# Patient Record
Sex: Female | Born: 1960
Health system: Southern US, Community
[De-identification: ages and names within clinical notes are randomized; demographics above are authoritative.]

## PROBLEM LIST (undated history)

## (undated) DIAGNOSIS — M1990 Unspecified osteoarthritis, unspecified site: Secondary | ICD-10-CM

## (undated) DIAGNOSIS — N185 Chronic kidney disease, stage 5: Secondary | ICD-10-CM

## (undated) DIAGNOSIS — E119 Type 2 diabetes mellitus without complications: Secondary | ICD-10-CM

## (undated) DIAGNOSIS — E669 Obesity, unspecified: Secondary | ICD-10-CM

## (undated) DIAGNOSIS — Z8673 Personal history of transient ischemic attack (TIA), and cerebral infarction without residual deficits: Secondary | ICD-10-CM

## (undated) DIAGNOSIS — R569 Unspecified convulsions: Secondary | ICD-10-CM

## (undated) DIAGNOSIS — D649 Anemia, unspecified: Secondary | ICD-10-CM

## (undated) DIAGNOSIS — F29 Unspecified psychosis not due to a substance or known physiological condition: Secondary | ICD-10-CM

## (undated) DIAGNOSIS — I10 Essential (primary) hypertension: Secondary | ICD-10-CM

## (undated) HISTORY — DX: Unspecified osteoarthritis, unspecified site: M19.90

## (undated) HISTORY — DX: Obesity, unspecified: E66.9

## (undated) HISTORY — DX: Chronic kidney disease, stage 5: N18.5

## (undated) HISTORY — PX: ABDOMINAL HYSTERECTOMY: SHX81

## (undated) HISTORY — DX: Personal history of transient ischemic attack (TIA), and cerebral infarction without residual deficits: Z86.73

---

## 2016-10-18 DIAGNOSIS — N19 Unspecified kidney failure: Secondary | ICD-10-CM | POA: Diagnosis not present

## 2016-10-18 DIAGNOSIS — E119 Type 2 diabetes mellitus without complications: Secondary | ICD-10-CM | POA: Diagnosis not present

## 2016-10-18 DIAGNOSIS — J329 Chronic sinusitis, unspecified: Secondary | ICD-10-CM | POA: Diagnosis not present

## 2016-10-18 DIAGNOSIS — I10 Essential (primary) hypertension: Secondary | ICD-10-CM | POA: Diagnosis not present

## 2016-11-16 DIAGNOSIS — I639 Cerebral infarction, unspecified: Secondary | ICD-10-CM | POA: Diagnosis not present

## 2016-11-16 DIAGNOSIS — N19 Unspecified kidney failure: Secondary | ICD-10-CM | POA: Diagnosis not present

## 2016-11-16 DIAGNOSIS — E119 Type 2 diabetes mellitus without complications: Secondary | ICD-10-CM | POA: Diagnosis not present

## 2016-11-16 DIAGNOSIS — I10 Essential (primary) hypertension: Secondary | ICD-10-CM | POA: Diagnosis not present

## 2017-01-18 DIAGNOSIS — E119 Type 2 diabetes mellitus without complications: Secondary | ICD-10-CM | POA: Diagnosis not present

## 2017-01-18 DIAGNOSIS — I10 Essential (primary) hypertension: Secondary | ICD-10-CM | POA: Diagnosis not present

## 2017-03-13 DIAGNOSIS — E119 Type 2 diabetes mellitus without complications: Secondary | ICD-10-CM | POA: Diagnosis not present

## 2017-03-13 DIAGNOSIS — R112 Nausea with vomiting, unspecified: Secondary | ICD-10-CM | POA: Diagnosis not present

## 2017-03-13 DIAGNOSIS — I10 Essential (primary) hypertension: Secondary | ICD-10-CM | POA: Diagnosis not present

## 2017-03-13 DIAGNOSIS — Z794 Long term (current) use of insulin: Secondary | ICD-10-CM | POA: Diagnosis not present

## 2017-03-13 DIAGNOSIS — Z8673 Personal history of transient ischemic attack (TIA), and cerebral infarction without residual deficits: Secondary | ICD-10-CM | POA: Diagnosis not present

## 2017-03-13 DIAGNOSIS — Z7902 Long term (current) use of antithrombotics/antiplatelets: Secondary | ICD-10-CM | POA: Diagnosis not present

## 2017-03-13 DIAGNOSIS — R1011 Right upper quadrant pain: Secondary | ICD-10-CM | POA: Diagnosis not present

## 2017-03-13 DIAGNOSIS — I252 Old myocardial infarction: Secondary | ICD-10-CM | POA: Diagnosis not present

## 2017-03-13 DIAGNOSIS — Z87891 Personal history of nicotine dependence: Secondary | ICD-10-CM | POA: Diagnosis not present

## 2017-03-13 DIAGNOSIS — R1010 Upper abdominal pain, unspecified: Secondary | ICD-10-CM | POA: Diagnosis not present

## 2017-03-13 DIAGNOSIS — R197 Diarrhea, unspecified: Secondary | ICD-10-CM | POA: Diagnosis not present

## 2017-05-06 DIAGNOSIS — N8110 Cystocele, unspecified: Secondary | ICD-10-CM | POA: Diagnosis not present

## 2017-05-06 DIAGNOSIS — S90811A Abrasion, right foot, initial encounter: Secondary | ICD-10-CM | POA: Diagnosis not present

## 2017-05-06 DIAGNOSIS — Z87891 Personal history of nicotine dependence: Secondary | ICD-10-CM | POA: Diagnosis not present

## 2017-06-10 DIAGNOSIS — Z87891 Personal history of nicotine dependence: Secondary | ICD-10-CM | POA: Diagnosis not present

## 2017-06-10 DIAGNOSIS — R0602 Shortness of breath: Secondary | ICD-10-CM | POA: Diagnosis not present

## 2017-06-10 DIAGNOSIS — I252 Old myocardial infarction: Secondary | ICD-10-CM | POA: Diagnosis not present

## 2017-06-10 DIAGNOSIS — I10 Essential (primary) hypertension: Secondary | ICD-10-CM | POA: Diagnosis not present

## 2017-06-10 DIAGNOSIS — R0789 Other chest pain: Secondary | ICD-10-CM | POA: Diagnosis not present

## 2017-06-10 DIAGNOSIS — E119 Type 2 diabetes mellitus without complications: Secondary | ICD-10-CM | POA: Diagnosis not present

## 2017-06-10 DIAGNOSIS — E7800 Pure hypercholesterolemia, unspecified: Secondary | ICD-10-CM | POA: Diagnosis not present

## 2017-06-10 DIAGNOSIS — Z8673 Personal history of transient ischemic attack (TIA), and cerebral infarction without residual deficits: Secondary | ICD-10-CM | POA: Diagnosis not present

## 2017-06-10 DIAGNOSIS — K219 Gastro-esophageal reflux disease without esophagitis: Secondary | ICD-10-CM | POA: Diagnosis not present

## 2017-06-22 ENCOUNTER — Encounter (HOSPITAL_COMMUNITY): Payer: Self-pay

## 2017-06-22 ENCOUNTER — Observation Stay (HOSPITAL_COMMUNITY)
Admission: EM | Admit: 2017-06-22 | Discharge: 2017-06-23 | Disposition: A | Discharge status: Home / Self Care | Payer: Medicare Other | Attending: Family Medicine | Admitting: Family Medicine

## 2017-06-22 DIAGNOSIS — E1122 Type 2 diabetes mellitus with diabetic chronic kidney disease: Secondary | ICD-10-CM | POA: Insufficient documentation

## 2017-06-22 DIAGNOSIS — Z880 Allergy status to penicillin: Secondary | ICD-10-CM | POA: Diagnosis not present

## 2017-06-22 DIAGNOSIS — Z794 Long term (current) use of insulin: Secondary | ICD-10-CM | POA: Diagnosis not present

## 2017-06-22 DIAGNOSIS — N289 Disorder of kidney and ureter, unspecified: Secondary | ICD-10-CM

## 2017-06-22 DIAGNOSIS — Z79899 Other long term (current) drug therapy: Secondary | ICD-10-CM | POA: Insufficient documentation

## 2017-06-22 DIAGNOSIS — Z87891 Personal history of nicotine dependence: Secondary | ICD-10-CM | POA: Insufficient documentation

## 2017-06-22 DIAGNOSIS — B9689 Other specified bacterial agents as the cause of diseases classified elsewhere: Secondary | ICD-10-CM | POA: Insufficient documentation

## 2017-06-22 DIAGNOSIS — Z8489 Family history of other specified conditions: Secondary | ICD-10-CM | POA: Insufficient documentation

## 2017-06-22 DIAGNOSIS — I129 Hypertensive chronic kidney disease with stage 1 through stage 4 chronic kidney disease, or unspecified chronic kidney disease: Secondary | ICD-10-CM | POA: Diagnosis not present

## 2017-06-22 DIAGNOSIS — G40909 Epilepsy, unspecified, not intractable, without status epilepticus: Secondary | ICD-10-CM | POA: Insufficient documentation

## 2017-06-22 DIAGNOSIS — D649 Anemia, unspecified: Secondary | ICD-10-CM | POA: Diagnosis present

## 2017-06-22 DIAGNOSIS — E1165 Type 2 diabetes mellitus with hyperglycemia: Secondary | ICD-10-CM | POA: Diagnosis not present

## 2017-06-22 DIAGNOSIS — D631 Anemia in chronic kidney disease: Secondary | ICD-10-CM | POA: Insufficient documentation

## 2017-06-22 DIAGNOSIS — Z885 Allergy status to narcotic agent status: Secondary | ICD-10-CM | POA: Insufficient documentation

## 2017-06-22 DIAGNOSIS — N179 Acute kidney failure, unspecified: Secondary | ICD-10-CM | POA: Diagnosis not present

## 2017-06-22 DIAGNOSIS — N189 Chronic kidney disease, unspecified: Secondary | ICD-10-CM | POA: Diagnosis not present

## 2017-06-22 DIAGNOSIS — I10 Essential (primary) hypertension: Secondary | ICD-10-CM | POA: Diagnosis present

## 2017-06-22 DIAGNOSIS — Z881 Allergy status to other antibiotic agents status: Secondary | ICD-10-CM | POA: Diagnosis not present

## 2017-06-22 DIAGNOSIS — Z833 Family history of diabetes mellitus: Secondary | ICD-10-CM | POA: Diagnosis not present

## 2017-06-22 DIAGNOSIS — N760 Acute vaginitis: Secondary | ICD-10-CM | POA: Diagnosis not present

## 2017-06-22 HISTORY — DX: Unspecified convulsions: R56.9

## 2017-06-22 HISTORY — DX: Essential (primary) hypertension: I10

## 2017-06-22 LAB — CBC WITH DIFFERENTIAL/PLATELET
BASOS ABS: 0 10*3/uL (ref 0.0–0.1)
BASOS PCT: 0 %
Eosinophils Absolute: 0.2 10*3/uL (ref 0.0–0.7)
Eosinophils Relative: 4 %
HEMATOCRIT: 30.6 % — AB (ref 36.0–46.0)
Hemoglobin: 9.9 g/dL — ABNORMAL LOW (ref 12.0–15.0)
Lymphocytes Relative: 38 %
Lymphs Abs: 2.3 10*3/uL (ref 0.7–4.0)
MCH: 25.6 pg — ABNORMAL LOW (ref 26.0–34.0)
MCHC: 32.4 g/dL (ref 30.0–36.0)
MCV: 79.3 fL (ref 78.0–100.0)
MONO ABS: 0.5 10*3/uL (ref 0.1–1.0)
MONOS PCT: 8 %
NEUTROS ABS: 3.1 10*3/uL (ref 1.7–7.7)
Neutrophils Relative %: 50 %
Platelets: 293 10*3/uL (ref 150–400)
RBC: 3.86 MIL/uL — ABNORMAL LOW (ref 3.87–5.11)
RDW: 15.1 % (ref 11.5–15.5)
WBC: 6.1 10*3/uL (ref 4.0–10.5)

## 2017-06-22 LAB — I-STAT CHEM 8, ED
BUN: 30 mg/dL — ABNORMAL HIGH (ref 6–20)
CALCIUM ION: 1.16 mmol/L (ref 1.15–1.40)
CHLORIDE: 104 mmol/L (ref 101–111)
Creatinine, Ser: 2.8 mg/dL — ABNORMAL HIGH (ref 0.44–1.00)
GLUCOSE: 369 mg/dL — AB (ref 65–99)
HCT: 29 % — ABNORMAL LOW (ref 36.0–46.0)
Hemoglobin: 9.9 g/dL — ABNORMAL LOW (ref 12.0–15.0)
Potassium: 4.6 mmol/L (ref 3.5–5.1)
Sodium: 134 mmol/L — ABNORMAL LOW (ref 135–145)
TCO2: 22 mmol/L (ref 22–32)

## 2017-06-22 LAB — WET PREP, GENITAL
Sperm: NONE SEEN
TRICH WET PREP: NONE SEEN
Yeast Wet Prep HPF POC: NONE SEEN

## 2017-06-22 LAB — CBG MONITORING, ED: Glucose-Capillary: 393 mg/dL — ABNORMAL HIGH (ref 65–99)

## 2017-06-22 MED ORDER — BACITRACIN ZINC 500 UNIT/GM EX OINT
TOPICAL_OINTMENT | Freq: Two times a day (BID) | CUTANEOUS | Status: DC
  Administered 2017-06-22 – 2017-06-23 (×2): via TOPICAL
  Filled 2017-06-22: qty 0.9

## 2017-06-22 MED ORDER — SODIUM CHLORIDE 0.9 % IV BOLUS (SEPSIS)
1000.0000 mL | Freq: Once | INTRAVENOUS | Status: AC
  Administered 2017-06-22: 1000 mL via INTRAVENOUS
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
## 2017-06-22 NOTE — ED Provider Notes (Signed)
THIS IS A SHARED VISIT WITH DAVID SMITH, NP BP (!) 165/64 (BP Location: Left Arm)   Pulse 72   Temp 98.3 F (36.8 C) (Oral)   Resp 20   SpO2 100%    Amber Lara is a 56 y.o. female who request a female to do a pelvic exam because she feels like there is something in her vagina. She is sexually active with one partner but does not know if he has other partners.   Pelvic exam, external genitalia without lesions. Cystocele visualized. Cervix absent small amount of white d/c vaginal vault. Uterus absent. No tenderness on bimanual exam.  Swabs collected for cultures and wet prep.  Patient tolerated the exam without any difficulty.    Debroah Baller [place], [place] 06/22/17 [id number]
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
## 2017-06-22 NOTE — ED Notes (Signed)
Bed: WTR7 Expected date:  Expected time:  Means of arrival:  Comments: 

## 2017-06-23 ENCOUNTER — Encounter (HOSPITAL_COMMUNITY): Payer: Self-pay | Admitting: Internal Medicine

## 2017-06-23 DIAGNOSIS — N179 Acute kidney failure, unspecified: Secondary | ICD-10-CM

## 2017-06-23 DIAGNOSIS — N189 Chronic kidney disease, unspecified: Secondary | ICD-10-CM

## 2017-06-23 DIAGNOSIS — E1165 Type 2 diabetes mellitus with hyperglycemia: Secondary | ICD-10-CM

## 2017-06-23 DIAGNOSIS — I10 Essential (primary) hypertension: Secondary | ICD-10-CM | POA: Diagnosis present

## 2017-06-23 DIAGNOSIS — D649 Anemia, unspecified: Secondary | ICD-10-CM | POA: Diagnosis present

## 2017-06-23 DIAGNOSIS — D638 Anemia in other chronic diseases classified elsewhere: Secondary | ICD-10-CM

## 2017-06-23 LAB — BASIC METABOLIC PANEL
ANION GAP: 8 (ref 5–15)
BUN: 26 mg/dL — ABNORMAL HIGH (ref 6–20)
CO2: 21 mmol/L — AB (ref 22–32)
Calcium: 8.7 mg/dL — ABNORMAL LOW (ref 8.9–10.3)
Chloride: 107 mmol/L (ref 101–111)
Creatinine, Ser: 2.39 mg/dL — ABNORMAL HIGH (ref 0.44–1.00)
GFR calc Af Amer: 25 mL/min — ABNORMAL LOW (ref >60)
GFR calc non Af Amer: 22 mL/min — ABNORMAL LOW (ref >60)
GLUCOSE: 240 mg/dL — AB (ref 65–99)
POTASSIUM: 4.6 mmol/L (ref 3.5–5.1)
Sodium: 136 mmol/L (ref 135–145)

## 2017-06-23 LAB — GLUCOSE, CAPILLARY
GLUCOSE-CAPILLARY: 236 mg/dL — AB (ref 65–99)
Glucose-Capillary: 243 mg/dL — ABNORMAL HIGH (ref 65–99)
Glucose-Capillary: 251 mg/dL — ABNORMAL HIGH (ref 65–99)
Glucose-Capillary: 241 mg/dL — ABNORMAL HIGH (ref 65–99)

## 2017-06-23 LAB — CBC
HEMATOCRIT: 27.1 % — AB (ref 36.0–46.0)
HEMOGLOBIN: 8.9 g/dL — AB (ref 12.0–15.0)
MCH: 26.4 pg (ref 26.0–34.0)
MCHC: 32.8 g/dL (ref 30.0–36.0)
MCV: 80.4 fL (ref 78.0–100.0)
Platelets: 237 10*3/uL (ref 150–400)
RBC: 3.37 MIL/uL — ABNORMAL LOW (ref 3.87–5.11)
RDW: 15.4 % (ref 11.5–15.5)
WBC: 4.9 10*3/uL (ref 4.0–10.5)

## 2017-06-23 MED ORDER — ACETAMINOPHEN 325 MG PO TABS
650.0000 mg | ORAL_TABLET | Freq: Four times a day (QID) | ORAL | Status: DC | PRN
  Administered 2017-06-23: 650 mg via ORAL
  Filled 2017-06-23: qty 2

## 2017-06-23 MED ORDER — BENZTROPINE MESYLATE 1 MG PO TABS
2.0000 mg | ORAL_TABLET | Freq: Two times a day (BID) | ORAL | Status: DC
  Administered 2017-06-23 (×2): 2 mg via ORAL
  Filled 2017-06-23 (×3): qty 2

## 2017-06-23 MED ORDER — NOVOLOG FLEXPEN 100 UNIT/ML ~~LOC~~ SOPN: PEN_INJECTOR | SUBCUTANEOUS | 0 refills | Status: DC

## 2017-06-23 MED ORDER — CYCLOBENZAPRINE HCL 10 MG PO TABS
5.0000 mg | ORAL_TABLET | Freq: Three times a day (TID) | ORAL | Status: DC
  Administered 2017-06-23 (×2): 5 mg via ORAL
  Filled 2017-06-23 (×2): qty 1

## 2017-06-23 MED ORDER — FERROUS FUMARATE 324 (106 FE) MG PO TABS
1.0000 | ORAL_TABLET | Freq: Every day | ORAL | Status: DC
  Administered 2017-06-23: 106 mg via ORAL
  Filled 2017-06-23: qty 1

## 2017-06-23 MED ORDER — METRONIDAZOLE 500 MG PO TABS
500.0000 mg | ORAL_TABLET | Freq: Two times a day (BID) | ORAL | Status: DC
  Administered 2017-06-23: 500 mg via ORAL
  Filled 2017-06-23: qty 1

## 2017-06-23 MED ORDER — ONDANSETRON HCL 4 MG PO TABS: 4.0000 mg | ORAL_TABLET | Freq: Four times a day (QID) | ORAL | Status: DC | PRN

## 2017-06-23 MED ORDER — HEPARIN SODIUM (PORCINE) 5000 UNIT/ML IJ SOLN
5000.0000 [IU] | Freq: Three times a day (TID) | INTRAMUSCULAR | Status: DC
  Administered 2017-06-23 (×2): 5000 [IU] via SUBCUTANEOUS
  Filled 2017-06-23 (×2): qty 1

## 2017-06-23 MED ORDER — LANTUS SOLOSTAR 100 UNIT/ML ~~LOC~~ SOPN: 70.0000 [IU] | PEN_INJECTOR | Freq: Every evening | SUBCUTANEOUS | 0 refills | Status: DC

## 2017-06-23 MED ORDER — PANTOPRAZOLE SODIUM 40 MG PO TBEC
40.0000 mg | DELAYED_RELEASE_TABLET | Freq: Every day | ORAL | Status: DC
  Administered 2017-06-23: 40 mg via ORAL
  Filled 2017-06-23: qty 1

## 2017-06-23 MED ORDER — INSULIN GLARGINE 100 UNIT/ML ~~LOC~~ SOLN
70.0000 [IU] | Freq: Every evening | SUBCUTANEOUS | Status: DC
  Filled 2017-06-23: qty 0.7

## 2017-06-23 MED ORDER — SODIUM CHLORIDE 0.9 % IV SOLN
INTRAVENOUS | Status: AC
  Administered 2017-06-23: 04:00:00 via INTRAVENOUS

## 2017-06-23 MED ORDER — METRONIDAZOLE 500 MG PO TABS: 500.0000 mg | ORAL_TABLET | Freq: Two times a day (BID) | ORAL | 0 refills | Status: DC

## 2017-06-23 MED ORDER — ONDANSETRON HCL 4 MG/2ML IJ SOLN: 4.0000 mg | Freq: Four times a day (QID) | INTRAMUSCULAR | Status: DC | PRN

## 2017-06-23 MED ORDER — INSULIN ASPART 100 UNIT/ML ~~LOC~~ SOLN
0.0000 [IU] | Freq: Three times a day (TID) | SUBCUTANEOUS | Status: DC
  Administered 2017-06-23 (×2): 5 [IU] via SUBCUTANEOUS

## 2017-06-23 MED ORDER — ACETAMINOPHEN 650 MG RE SUPP: 650.0000 mg | Freq: Four times a day (QID) | RECTAL | Status: DC | PRN

## 2017-06-23 MED ORDER — LEVETIRACETAM 500 MG PO TABS
1000.0000 mg | ORAL_TABLET | Freq: Two times a day (BID) | ORAL | Status: DC
  Administered 2017-06-23: 1000 mg via ORAL
  Filled 2017-06-23: qty 2

## 2017-06-23 MED ORDER — DOCUSATE SODIUM 100 MG PO CAPS
200.0000 mg | ORAL_CAPSULE | Freq: Two times a day (BID) | ORAL | Status: DC
  Administered 2017-06-23: 200 mg via ORAL
  Filled 2017-06-23: qty 2

## 2017-06-23 MED ORDER — INSULIN GLARGINE 100 UNIT/ML ~~LOC~~ SOLN
20.0000 [IU] | Freq: Once | SUBCUTANEOUS | Status: AC
  Administered 2017-06-23: 20 [IU] via SUBCUTANEOUS
  Filled 2017-06-23: qty 0.2

## 2017-06-23 MED ORDER — FUROSEMIDE 20 MG PO TABS: 20.0000 mg | ORAL_TABLET | Freq: Two times a day (BID) | ORAL | 0 refills | Status: DC

## 2017-06-23 MED ORDER — SIMVASTATIN 10 MG PO TABS: 20.0000 mg | ORAL_TABLET | Freq: Every evening | ORAL | Status: DC
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
## 2017-06-23 NOTE — Care Management Note (Signed)
Case Management Note  Patient Details  Name: Amber Lara MRN: [id number] Date of Birth: 1961-08-14  Subjective/Objective:                  hyperglycemia  Action/Plan: Date:  June 23, 2017 Chart reviewed for concurrent status and case management needs.  Will continue to follow patient progress.  Discharge Planning: following for needs  Expected discharge date: [id number]  Velva Harman, BSN, [place], [address]   Expected Discharge Date:  06/25/17               Expected Discharge Plan:  Home/Self Care  In-House Referral:     Discharge planning Services  CM Consult  Post Acute Care Choice:    Choice offered to:     DME Arranged:    DME Agency:     HH Arranged:    HH Agency:     Status of Service:  In process, will continue to follow  If discussed at Long Length of Stay Meetings, dates discussed:    Additional Comments:  Leeroy Cha, RN 06/23/2017, 9:34 AM
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
## 2017-06-23 NOTE — Progress Notes (Signed)
Inpatient Diabetes Program Recommendations  AACE/ADA: New Consensus Statement on Inpatient Glycemic Control (2015)  Target Ranges:  Prepandial:   less than 140 mg/dL      Peak postprandial:   less than 180 mg/dL (1-2 hours)      Critically ill patients:  140 - 180 mg/dL   Lab Results  Component Value Date   GLUCAP 241 (H) 06/23/2017    Review of Glycemic Control  Diabetes history: DM2 Outpatient Diabetes medications: Lantus 70 units + Novolog 10-15 units tid meal coverage Current orders for Inpatient glycemic control: Lantus 70 units + Novolog Moderate correction tid  Inpatient Diabetes Program Recommendations:    Please consider: -Novolog 5 units meal coverage tid if eats 50% -A1c to determine prehospital glycemic control -Add Novolog 0-5 units hs  Will follow during hospitalization.  Thank you, Nani Gasser. Coy Vandoren, RN, MSN, CDE  Diabetes Coordinator Inpatient Glycemic Control Team Team Pager [phone] (8am-5pm) 06/23/2017 10:34 AM

## 2017-06-24 LAB — HIV ANTIBODY (ROUTINE TESTING W REFLEX): HIV Screen 4th Generation wRfx: NONREACTIVE

## 2017-06-26 LAB — GC/CHLAMYDIA PROBE AMP (~~LOC~~) NOT AT ARMC
Chlamydia: NEGATIVE
Neisseria Gonorrhea: NEGATIVE

## 2017-06-28 DIAGNOSIS — Z794 Long term (current) use of insulin: Principal | ICD-10-CM

## 2017-06-28 DIAGNOSIS — E0800 Diabetes mellitus due to underlying condition with hyperosmolarity without nonketotic hyperglycemic-hyperosmolar coma (NKHHC): Secondary | ICD-10-CM

## 2017-07-03 ENCOUNTER — Telehealth: Payer: Self-pay

## 2017-07-03 LAB — GLUCOSE, POCT (MANUAL RESULT ENTRY): POC Glucose: 282 mg/dl (ref 70–99)
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
## 2017-07-03 NOTE — Telephone Encounter (Signed)
TC to [facility] to arrange PCP appointment for client in order to establish care as she is a diabetic and has high blood pressure Appointment was set for 07-10-17 @  8 30 am . Will let client know next week of her appointment time to establish PCP

## 2017-07-04 DIAGNOSIS — Z794 Long term (current) use of insulin: Principal | ICD-10-CM

## 2017-07-04 DIAGNOSIS — E0800 Diabetes mellitus due to underlying condition with hyperosmolarity without nonketotic hyperglycemic-hyperosmolar coma (NKHHC): Secondary | ICD-10-CM

## 2017-07-06 LAB — GLUCOSE, POCT (MANUAL RESULT ENTRY): POC Glucose: 380 mg/dl (ref 70–99)

## 2017-07-06 NOTE — Congregational Nurse Program (Signed)
Congregational Nurse Program Note  Date of Encounter: 07/04/2017  Past Medical History: Past Medical History:  Diagnosis Date  . Diabetes mellitus without complication ([place])   . Hypertension   . Seizures ([place])     Encounter Details:     CNP Questionnaire - 07/06/17 1717      Patient Demographics   Is this a new or existing patient? Existing   Patient is considered a/an Not Applicable   Race African-American/Black     Patient Assistance   Location of Patient Assistance Not Applicable   Patient's financial/insurance status Medicare   Uninsured Patient ([place] Card/Care Connects) No   Patient referred to apply for the following financial assistance Not Applicable   Food insecurities addressed Not Applicable   Transportation assistance No   Assistance securing medications No   Educational health offerings Chronic disease;Diabetes;Hypertension;Navigating the healthcare system     Encounter Details   Primary purpose of visit Spiritual Care/Support Visit;Education/Health Concerns;Navigating the Healthcare System   Was an Emergency Department visit averted? Not Applicable   Does patient have a medical provider? No   Patient referred to Establish PCP   Was a mental health screening completed? (GAINS tool) No   Does patient have dental issues? No   Does patient have vision issues? No   Does your patient have an abnormal blood pressure today? Yes   Since previous encounter, have you referred patient for abnormal blood pressure that resulted in a new diagnosis or medication change? No   Does your patient have an abnormal blood glucose today? Yes   Since previous encounter, have you referred patient for abnormal blood glucose that resulted in a new diagnosis or medication change? No   Was there a life-saving intervention made? No     Nurse was able to secure PCP appointment for client on 07-10-17 @ 8:30 am given to client address and encouraged to keep in order to get blood work and  establish PCP. Client in agreement . Almost out of her medications will need MD to refill medications . States she is taking her medications . Blood  Sugar up somewhat states she drunk a sugary soda . Counseled briefly on nutrition. Will follow up next week after MD visit.

## 2017-07-10 ENCOUNTER — Emergency Department (HOSPITAL_COMMUNITY): Payer: Medicare Other

## 2017-07-10 ENCOUNTER — Encounter (HOSPITAL_COMMUNITY): Payer: Self-pay | Admitting: *Deleted

## 2017-07-10 ENCOUNTER — Emergency Department (HOSPITAL_COMMUNITY)
Admission: EM | Admit: 2017-07-10 | Discharge: 2017-07-11 | Disposition: A | Discharge status: Home / Self Care | Payer: Medicare Other | Attending: Emergency Medicine | Admitting: Emergency Medicine

## 2017-07-10 ENCOUNTER — Ambulatory Visit: Payer: Self-pay | Admitting: Family Medicine

## 2017-07-10 DIAGNOSIS — X58XXXA Exposure to other specified factors, initial encounter: Secondary | ICD-10-CM | POA: Insufficient documentation

## 2017-07-10 DIAGNOSIS — Z87891 Personal history of nicotine dependence: Secondary | ICD-10-CM | POA: Diagnosis not present

## 2017-07-10 DIAGNOSIS — Y929 Unspecified place or not applicable: Secondary | ICD-10-CM | POA: Diagnosis not present

## 2017-07-10 DIAGNOSIS — Y9389 Activity, other specified: Secondary | ICD-10-CM | POA: Diagnosis not present

## 2017-07-10 DIAGNOSIS — F0390 Unspecified dementia without behavioral disturbance: Secondary | ICD-10-CM | POA: Insufficient documentation

## 2017-07-10 DIAGNOSIS — Y999 Unspecified external cause status: Secondary | ICD-10-CM | POA: Insufficient documentation

## 2017-07-10 DIAGNOSIS — Z79899 Other long term (current) drug therapy: Secondary | ICD-10-CM | POA: Diagnosis not present

## 2017-07-10 DIAGNOSIS — W19XXXA Unspecified fall, initial encounter: Secondary | ICD-10-CM

## 2017-07-10 DIAGNOSIS — M542 Cervicalgia: Secondary | ICD-10-CM | POA: Insufficient documentation

## 2017-07-10 DIAGNOSIS — S098XXA Other specified injuries of head, initial encounter: Secondary | ICD-10-CM | POA: Diagnosis not present

## 2017-07-10 DIAGNOSIS — G4489 Other headache syndrome: Secondary | ICD-10-CM | POA: Diagnosis not present

## 2017-07-10 DIAGNOSIS — I10 Essential (primary) hypertension: Secondary | ICD-10-CM | POA: Insufficient documentation

## 2017-07-10 DIAGNOSIS — E1165 Type 2 diabetes mellitus with hyperglycemia: Secondary | ICD-10-CM | POA: Insufficient documentation

## 2017-07-10 DIAGNOSIS — S0083XA Contusion of other part of head, initial encounter: Secondary | ICD-10-CM | POA: Diagnosis not present

## 2017-07-10 NOTE — ED Notes (Addendum)
Pt called to finished triage, no answer.

## 2017-07-10 NOTE — ED Triage Notes (Signed)
Pt states she had a fall this morning and reports neck pain, sciatic nerve pain, and a headache. Pt reports falling off the bed and hit her head.  No obvious deformity noted on pt's head.  Pt reports nausea.  No LOC. Pt a/o x 4 and ambulatory.

## 2017-07-10 NOTE — ED Triage Notes (Signed)
Per EMS- patient c/o fall 3-4 hours ago. Patient c/o right leg pain, posterior neck pain, and states she hit her head. EMS reported no deformities.

## 2017-07-11 ENCOUNTER — Encounter (HOSPITAL_COMMUNITY): Payer: Self-pay | Admitting: Emergency Medicine

## 2017-07-11 MED ORDER — ACETAMINOPHEN 500 MG PO TABS
1000.0000 mg | ORAL_TABLET | Freq: Once | ORAL | Status: AC
  Administered 2017-07-11: 1000 mg via ORAL
  Filled 2017-07-11: qty 2

## 2017-07-11 MED ORDER — IBUPROFEN 800 MG PO TABS
800.0000 mg | ORAL_TABLET | Freq: Once | ORAL | Status: AC
  Administered 2017-07-11: 800 mg via ORAL
  Filled 2017-07-11: qty 1

## 2017-07-11 NOTE — Congregational Nurse Program (Signed)
Congregational Nurse Program Note  Date of Encounter: 07/11/2017  Past Medical History: Past Medical History:  Diagnosis Date  . Diabetes mellitus without complication ([place])   . Hypertension   . Seizures ([place])     Encounter Details:     CNP Questionnaire - 07/11/17 2238      Patient Demographics   Is this a new or existing patient? Existing   Patient is considered a/an Not Applicable   Race African-American/Black     Patient Assistance   Location of Patient Assistance Not Applicable   Patient's financial/insurance status Medicare   Uninsured Patient ([place] Card/Care Connects) No   Patient referred to apply for the following financial assistance Not Applicable   Food insecurities addressed Not Applicable   Transportation assistance No   Assistance securing medications No   Doctor, hospital the healthcare system;Medications;Diabetes;Chronic disease     Encounter Details   Primary purpose of visit Education/Health Concerns;Chronic Illness/Condition Visit;Navigating the Healthcare System;Spiritual Care/Support Visit   Was an Emergency Department visit averted? Not Applicable   Does patient have a medical provider? No   Patient referred to Establish PCP   Was a mental health screening completed? (GAINS tool) No   Does patient have dental issues? No   Does patient have vision issues? No   Does your patient have an abnormal blood pressure today? Yes   Since previous encounter, have you referred patient for abnormal blood pressure that resulted in a new diagnosis or medication change? No   Does your patient have an abnormal blood glucose today? Yes   Since previous encounter, have you referred patient for abnormal blood glucose that resulted in a new diagnosis or medication change? No   Was there a life-saving intervention made? No     Client in today but  Didn't keep her initial PCP appointment she states because she went to ED after falling out of bed  and hit her head. States they didn't give me any thing ?? States I am almost out of my insulin . Ask if she had refills  Of medication not  Sure ,nurse called  [facility] and there are no refills . Counseled that's why she needed to keep her appointment to establish care ,states she called and cancelled ,client will call tomorrow to see if she can reschedule and to emphasize she needs scripts to get her insulin. As a back up client was ask to call her old PCP in [facility]  To see if PCP would call in her a refill. Client to return to see her apartment down [place] but will not  Be returning there to live is moving on with her life and starting new. Very positive  Today . Ask if daughter and her were okay and talking now .  Client takes Lisinopril  20 mg and is taking it daily ,B/P up !    Follow  Tomorrow to see if  PCP appointment made and medication re-ordered!

## 2017-07-11 NOTE — ED Provider Notes (Signed)
[facility] DEPT Provider Note   CSN: [id number] Arrival date & time: 07/10/17  1112     History   Chief Complaint Chief Complaint  Patient presents with  . Fall  . Leg Pain  . Neck Pain  . Head Injury    HPI Amber Lara is a 56 y.o. female.  The history is provided by a relative and the EMS personnel. The history is limited by the condition of the patient.  Fall  This is a new problem. The current episode started 1 to 2 hours ago. The problem occurs constantly. The problem has not changed since onset.Pertinent negatives include no chest pain, no abdominal pain, no headaches and no shortness of breath. Nothing aggravates the symptoms. Nothing relieves the symptoms. She has tried nothing for the symptoms. The treatment provided no relief.    Past Medical History:  Diagnosis Date  . Diabetes mellitus without complication ([place])   . Hypertension   . Seizures [facility])     Patient Active Problem List   Diagnosis Date Noted  . Type 2 diabetes mellitus with hyperglycemia ([place]) 06/23/2017  . Normochromic normocytic anemia 06/23/2017  . Essential hypertension 06/23/2017  . Acute kidney injury superimposed on CKD ([place]) 06/23/2017    Past Surgical History:  Procedure Laterality Date  . ABDOMINAL HYSTERECTOMY      OB History    No data available       Home Medications    Prior to Admission medications   Medication Sig Start Date End Date Taking? Authorizing Provider  benztropine (COGENTIN) 2 MG tablet Take 2 mg by mouth 2 (two) times daily. 03/24/17   [provider]  cyclobenzaprine (FLEXERIL) 5 MG tablet Take 5 mg by mouth 3 (three) times daily. 06/08/17   [provider]  docusate sodium (COLACE) 100 MG capsule Take 200 mg by mouth 2 (two) times daily. 04/06/17   [provider]  Ferrous Fumarate (HEMOCYTE - 106 MG FE) 324 (106 Fe) MG TABS tablet Take 1 tablet by mouth daily. 06/08/17   [provider]  furosemide (LASIX) 20 MG  tablet Take 1 tablet (20 mg total) by mouth 2 (two) times daily. 06/23/17   Samuella Cota, MD  LANTUS SOLOSTAR 100 UNIT/ML Solostar Pen Inject 70 Units into the skin every evening. 06/23/17   Samuella Cota, MD  levETIRAcetam (KEPPRA) 1000 MG tablet Take 1,000 mg by mouth 2 (two) times daily. 05/09/17   [provider]  lisinopril (PRINIVIL,ZESTRIL) 30 MG tablet Take 30 mg by mouth daily. 06/08/17   [provider]  metroNIDAZOLE (FLAGYL) 500 MG tablet Take 1 tablet (500 mg total) by mouth every 12 (twelve) hours. 06/23/17   Samuella Cota, MD  NOVOLOG FLEXPEN 100 UNIT/ML FlexPen Per sliding scale given by your Ms. Belva Agee, your regular primary care provider. Breakfast, lunch, dinner. 06/23/17   Samuella Cota, MD  omeprazole (PRILOSEC) 20 MG capsule Take 20 mg by mouth daily. 06/08/17   [provider]  simvastatin (ZOCOR) 20 MG tablet Take 20 mg by mouth every evening. 06/08/17   [provider]    Family History Family History  Problem Relation Age of Onset  . Diabetes Mellitus II Mother   . Diabetes Mellitus II Sister   . Diabetes Mellitus II Brother     Social History Social History  Substance Use Topics  . Smoking status: Former Research scientist (life sciences)  . Smokeless tobacco: Never Used  . Alcohol use No     Allergies  Tetracyclines & related; Morphine and related; and Penicillins   Review of Systems Review of Systems  Unable to perform ROS: Dementia  Respiratory: Negative for shortness of breath.   Cardiovascular: Negative for chest pain.  Gastrointestinal: Negative for abdominal pain.  Genitourinary: Negative for pelvic pain.  Musculoskeletal: Positive for arthralgias.  Skin: Negative for pallor.  Neurological: Negative for dizziness, tremors, seizures, syncope, facial asymmetry, speech difficulty, weakness, light-headedness, numbness and headaches.  All other systems reviewed and are negative.    Physical Exam Updated Vital Signs BP  (!) 162/78 (BP Location: Right Arm)   Pulse 83   Temp (!) 97 F (36.1 C) (Oral)   Resp 18   Ht 5\' 3"  (1.6 m)   Wt 104.3 kg (230 lb)   SpO2 100%   BMI 40.74 kg/m   Physical Exam  Constitutional: She is oriented to person, place, and time. She appears well-developed and well-nourished. No distress.  HENT:  Head: Head is without raccoon's eyes and without Battle's sign.    Right Ear: No hemotympanum.  Left Ear: No hemotympanum.  Mouth/Throat: Oropharynx is clear and moist. No oropharyngeal exudate.  Eyes: Pupils are equal, round, and reactive to light. Conjunctivae are normal.  Neck: Normal range of motion. Neck supple.  Cardiovascular: Normal rate, regular rhythm, normal heart sounds and intact distal pulses.   Pulmonary/Chest: Effort normal and breath sounds normal. She has no wheezes. She has no rales.  Abdominal: Soft. Bowel sounds are normal. She exhibits no mass. There is no tenderness. There is no rebound and no guarding.  Musculoskeletal: Normal range of motion. She exhibits no tenderness or deformity.  Neurological: She is alert and oriented to person, place, and time. She displays normal reflexes.  Skin: Skin is warm and dry. Capillary refill takes less than 2 seconds.     ED Treatments / Results   Vitals:   07/10/17 1718 07/10/17 2351  BP: (!) 171/94 (!) 162/78  Pulse: 88 83  Resp: 16 18  Temp:  (!) 97 F (36.1 C)  SpO2: 100% 100%    Radiology Ct Head Wo Contrast  Result Date: 07/11/2017 CLINICAL DATA:  Patient fell out of bed this morning. Complains of neck pain and headache. No loss of consciousness. History of hypertension, diabetes, seizures. EXAM: CT HEAD WITHOUT CONTRAST CT CERVICAL SPINE WITHOUT CONTRAST TECHNIQUE: Multidetector CT imaging of the head and cervical spine was performed following the standard protocol without intravenous contrast. Multiplanar CT image reconstructions of the cervical spine were also generated. COMPARISON:  None. FINDINGS: CT  HEAD FINDINGS Brain: Mild cerebral atrophy. No ventricular dilatation. Focal low-attenuation areas demonstrated in the left anterior frontal lobe, the left temporal lobe, and the left posterior parietal and occipital lobe. These changes likely represent old or subacute infarcts. There is no mass effect or midline shift. No abnormal extra-axial fluid collections. Gray-white matter junctions are distinct. Basal cisterns are not effaced. No acute intracranial hemorrhage. Vascular: No hyperdense vessel or unexpected calcification. Skull: Calvarium appears intact. Sinuses/Orbits: Paranasal sinuses and mastoid air cells are not opacified. Other: Surgical clips versus calcifications in the soft tissues of the scalp over the bilateral temporal regions. CT CERVICAL SPINE FINDINGS Alignment: Straightening of usual cervical lordosis without anterior subluxation. This may be due to patient positioning but ligamentous injury or muscle spasm could also have this appearance and are not excluded. Normal alignment of the posterior facet joints. C1-2 articulation appears intact. Old appearing ununited ossicle adjacent to the right lateral mass of CT. Skull base  and vertebrae: No vertebral compression deformities. No focal bone lesion or bone destruction. Bone cortex appears intact. Soft tissues and spinal canal: No prevertebral soft tissue swelling. No paraspinal soft tissue infiltration. No soft tissue mass lesions identified. Disc levels: Mild degenerative endplate hypertrophic changes at C5-6 and C6-7 levels. Degenerative changes at C1 to. Upper chest: There is a 2.1 cm diameter right paratracheal lesion which is only seen on the inferior most slices of the study. This could represent the azygos vein but right peritracheal lymphadenopathy is not excluded. Consider follow-up with chest CT. Other: None. IMPRESSION: 1. No acute intracranial abnormalities. 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 Mild cerebral  atrophy. 2. Nonspecific straightening of usual cervical lordosis. No acute displaced fractures identified in the cervical spine. 3. Indeterminate right paratracheal lesion incompletely imaged on this study. This could represent the azygos vein or right peritracheal lymphadenopathy. Consider follow-up with chest CT. Electronically Signed   By: Lucienne Capers M.D.   On: 07/11/2017 00:33   Ct Cervical Spine Wo Contrast  Result Date: 07/11/2017 CLINICAL DATA:  Patient fell out of bed this morning. Complains of neck pain and headache. No loss of consciousness. History of hypertension, diabetes, seizures. EXAM: CT HEAD WITHOUT CONTRAST CT CERVICAL SPINE WITHOUT CONTRAST TECHNIQUE: Multidetector CT imaging of the head and cervical spine was performed following the standard protocol without intravenous contrast. Multiplanar CT image reconstructions of the cervical spine were also generated. COMPARISON:  None. FINDINGS: CT HEAD FINDINGS Brain: Mild cerebral atrophy. No ventricular dilatation. Focal low-attenuation areas demonstrated in the left anterior frontal lobe, the left temporal lobe, and the left posterior parietal and occipital lobe. These changes likely represent old or subacute infarcts. There is no mass effect or midline shift. No abnormal extra-axial fluid collections. Gray-white matter junctions are distinct. Basal cisterns are not effaced. No acute intracranial hemorrhage. Vascular: No hyperdense vessel or unexpected calcification. Skull: Calvarium appears intact. Sinuses/Orbits: Paranasal sinuses and mastoid air cells are not opacified. Other: Surgical clips versus calcifications in the soft tissues of the scalp over the bilateral temporal regions. CT CERVICAL SPINE FINDINGS Alignment: Straightening of usual cervical lordosis without anterior subluxation. This may be due to patient positioning but ligamentous injury or muscle spasm could also have this appearance and are not excluded. Normal alignment of  the posterior facet joints. C1-2 articulation appears intact. Old appearing ununited ossicle adjacent to the right lateral mass of CT. Skull base and vertebrae: No vertebral compression deformities. No focal bone lesion or bone destruction. Bone cortex appears intact. Soft tissues and spinal canal: No prevertebral soft tissue swelling. No paraspinal soft tissue infiltration. No soft tissue mass lesions identified. Disc levels: Mild degenerative endplate hypertrophic changes at C5-6 and C6-7 levels. Degenerative changes at C1 to. Upper chest: There is a 2.1 cm diameter right paratracheal lesion which is only seen on the inferior most slices of the study. This could represent the azygos vein but right peritracheal lymphadenopathy is not excluded. Consider follow-up with chest CT. Other: None. IMPRESSION: 1. No acute intracranial abnormalities. 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 Mild cerebral atrophy. 2. Nonspecific straightening of usual cervical lordosis. No acute displaced fractures identified in the cervical spine. 3. Indeterminate right paratracheal lesion incompletely imaged on this study. This could represent the azygos vein or right peritracheal lymphadenopathy. Consider follow-up with chest CT. Electronically Signed   By: Lucienne Capers M.D.   On: 07/11/2017 00:33   Dg Hip Unilat W Or Wo Pelvis 2-3 Views Right  Result Date: 07/11/2017 CLINICAL DATA:  Patient fell this morning landing on the right hip. Lateral hip pain with limited range of motion. EXAM: DG HIP (WITH OR WITHOUT PELVIS) 2-3V RIGHT COMPARISON:  None. FINDINGS: Visualization of the right hip is limited due to patient's body habitus. There is no evidence of hip fracture or dislocation. There is no evidence of arthropathy or other focal bone abnormality. IMPRESSION: Negative. Electronically Signed   By: Lucienne Capers M.D.   On: 07/11/2017 00:15    Procedures Procedures (including critical care  time)    Final Clinical Impressions(s) / ED Diagnoses    Follow up with your PMD.  Tell your doctor that radiology has recommended a dedicated neck CT to further differentiate the azygous vein from lymph gland seen on CT.  This was printed on your discharge papers for your to take to your doctor.    Return for facial wselling, shortness of breath, swelling or the lips or tongue, chest pain, dyspnea on exertion, new weakness or numbness changes in vision or speech,  Inability to tolerate liquids or food, changes in voice cough, altered mental status or any concerns. No signs of systemic illness or infection. The patient is nontoxic-appearing on exam and vital signs are within normal limits.    I have reviewed the triage vital signs and the nursing notes. Pertinent labs &imaging results that were available during my care of the patient were reviewed by me and considered in my medical decision making (see chart for details).  After history, exam, and medical workup I feel the patient has been appropriately medically screened and is safe for discharge home. Pertinent diagnoses were discussed with the patient. Patient was given return precautions.       Ronin Crager, MD 07/11/17 [phone]

## 2017-07-12 DIAGNOSIS — E0800 Diabetes mellitus due to underlying condition with hyperosmolarity without nonketotic hyperglycemic-hyperosmolar coma (NKHHC): Secondary | ICD-10-CM

## 2017-07-12 LAB — GLUCOSE, POCT (MANUAL RESULT ENTRY)

## 2017-07-12 NOTE — Congregational Nurse Program (Signed)
Congregational Nurse Program Note  Date of Encounter: 07/12/2017  Past Medical History: Past Medical History:  Diagnosis Date  . Diabetes mellitus without complication ([place])   . Hypertension   . Seizures ([place])     Encounter Details:     CNP Questionnaire - 07/12/17 1842      Patient Demographics   Is this a new or existing patient? Existing   Patient is considered a/an Not Applicable   Race African-American/Black     Patient Assistance   Location of Patient Assistance Not Applicable   Patient's financial/insurance status Medicare   Uninsured Patient ([place] [employer]) No   Patient referred to apply for the following financial assistance Not Applicable   Food insecurities addressed Not Applicable   Transportation assistance No   Assistance securing medications No   Educational health offerings Navigating the healthcare system;Diabetes;Chronic disease;Medications;Hypertension     Encounter Details   Primary purpose of visit Education/Health Concerns;Spiritual Care/Support Visit;Chronic Illness/Condition Visit   Was an Emergency Department visit averted? Not Applicable   Does patient have a medical provider? No   Patient referred to Establish PCP   Was a mental health screening completed? (GAINS tool) No   Does patient have vision issues? No   Does your patient have an abnormal blood pressure today? Yes   Since previous encounter, have you referred patient for abnormal blood pressure that resulted in a new diagnosis or medication change? No   Does your patient have an abnormal blood glucose today? Yes   Since previous encounter, have you referred patient for abnormal blood glucose that resulted in a new diagnosis or medication change? No   Was there a life-saving intervention made? No     Client in for follow up visit . Client called and got rescheduled to establish a PCP here since she will be living here Called and got an appointment  for tomorrow at  8 am [facility] . Client needs to keep appointment in order to get medications filled as she is almost out of her insulin and will need medications. no refills  ordered and we have called her pharmacy. Blood pressure and blood sugar higher today ,gave client her recent blood pressure readings to share with PCP ,may need to look at her medication for his .Marland Kitchen Needs A1-C also Counseled client on her readings .   will follow up next week after PCP appointment

## 2017-07-13 ENCOUNTER — Encounter (HOSPITAL_COMMUNITY): Payer: Self-pay | Admitting: Emergency Medicine

## 2017-07-13 DIAGNOSIS — Z79899 Other long term (current) drug therapy: Secondary | ICD-10-CM | POA: Insufficient documentation

## 2017-07-13 DIAGNOSIS — G8929 Other chronic pain: Secondary | ICD-10-CM | POA: Diagnosis not present

## 2017-07-13 DIAGNOSIS — Z885 Allergy status to narcotic agent status: Secondary | ICD-10-CM | POA: Insufficient documentation

## 2017-07-13 DIAGNOSIS — F29 Unspecified psychosis not due to a substance or known physiological condition: Secondary | ICD-10-CM | POA: Insufficient documentation

## 2017-07-13 DIAGNOSIS — Z833 Family history of diabetes mellitus: Secondary | ICD-10-CM | POA: Insufficient documentation

## 2017-07-13 DIAGNOSIS — E1122 Type 2 diabetes mellitus with diabetic chronic kidney disease: Secondary | ICD-10-CM | POA: Diagnosis not present

## 2017-07-13 DIAGNOSIS — E1165 Type 2 diabetes mellitus with hyperglycemia: Secondary | ICD-10-CM | POA: Diagnosis not present

## 2017-07-13 DIAGNOSIS — R600 Localized edema: Secondary | ICD-10-CM | POA: Diagnosis present

## 2017-07-13 DIAGNOSIS — E875 Hyperkalemia: Secondary | ICD-10-CM | POA: Diagnosis not present

## 2017-07-13 DIAGNOSIS — G40909 Epilepsy, unspecified, not intractable, without status epilepticus: Secondary | ICD-10-CM | POA: Insufficient documentation

## 2017-07-13 DIAGNOSIS — Z87892 Personal history of anaphylaxis: Secondary | ICD-10-CM | POA: Diagnosis not present

## 2017-07-13 DIAGNOSIS — E162 Hypoglycemia, unspecified: Secondary | ICD-10-CM | POA: Diagnosis not present

## 2017-07-13 DIAGNOSIS — D631 Anemia in chronic kidney disease: Secondary | ICD-10-CM | POA: Diagnosis not present

## 2017-07-13 DIAGNOSIS — M4050 Lordosis, unspecified, site unspecified: Secondary | ICD-10-CM | POA: Insufficient documentation

## 2017-07-13 DIAGNOSIS — Z87891 Personal history of nicotine dependence: Secondary | ICD-10-CM | POA: Insufficient documentation

## 2017-07-13 DIAGNOSIS — Z8249 Family history of ischemic heart disease and other diseases of the circulatory system: Secondary | ICD-10-CM | POA: Insufficient documentation

## 2017-07-13 DIAGNOSIS — R0789 Other chest pain: Principal | ICD-10-CM | POA: Insufficient documentation

## 2017-07-13 DIAGNOSIS — Z90710 Acquired absence of both cervix and uterus: Secondary | ICD-10-CM | POA: Insufficient documentation

## 2017-07-13 DIAGNOSIS — Z880 Allergy status to penicillin: Secondary | ICD-10-CM | POA: Diagnosis not present

## 2017-07-13 DIAGNOSIS — Z881 Allergy status to other antibiotic agents status: Secondary | ICD-10-CM | POA: Insufficient documentation

## 2017-07-13 DIAGNOSIS — E1121 Type 2 diabetes mellitus with diabetic nephropathy: Secondary | ICD-10-CM | POA: Insufficient documentation

## 2017-07-13 DIAGNOSIS — G319 Degenerative disease of nervous system, unspecified: Secondary | ICD-10-CM | POA: Insufficient documentation

## 2017-07-13 DIAGNOSIS — N184 Chronic kidney disease, stage 4 (severe): Secondary | ICD-10-CM | POA: Insufficient documentation

## 2017-07-13 DIAGNOSIS — I129 Hypertensive chronic kidney disease with stage 1 through stage 4 chronic kidney disease, or unspecified chronic kidney disease: Secondary | ICD-10-CM | POA: Insufficient documentation

## 2017-07-13 DIAGNOSIS — Z794 Long term (current) use of insulin: Secondary | ICD-10-CM | POA: Insufficient documentation

## 2017-07-13 DIAGNOSIS — I639 Cerebral infarction, unspecified: Secondary | ICD-10-CM | POA: Diagnosis not present

## 2017-07-13 DIAGNOSIS — Z590 Homelessness: Secondary | ICD-10-CM | POA: Diagnosis not present

## 2017-07-13 DIAGNOSIS — M5432 Sciatica, left side: Secondary | ICD-10-CM | POA: Diagnosis not present

## 2017-07-13 DIAGNOSIS — N179 Acute kidney failure, unspecified: Secondary | ICD-10-CM | POA: Insufficient documentation

## 2017-07-13 DIAGNOSIS — E1065 Type 1 diabetes mellitus with hyperglycemia: Secondary | ICD-10-CM | POA: Diagnosis not present

## 2017-07-13 LAB — CBC
HEMATOCRIT: 29.3 % — AB (ref 36.0–46.0)
HEMOGLOBIN: 9.5 g/dL — AB (ref 12.0–15.0)
MCH: 26.1 pg (ref 26.0–34.0)
MCHC: 32.4 g/dL (ref 30.0–36.0)
MCV: 80.5 fL (ref 78.0–100.0)
Platelets: 278 10*3/uL (ref 150–400)
RBC: 3.64 MIL/uL — AB (ref 3.87–5.11)
RDW: 15.1 % (ref 11.5–15.5)
WBC: 6.6 10*3/uL (ref 4.0–10.5)

## 2017-07-13 LAB — BASIC METABOLIC PANEL
ANION GAP: 10 (ref 5–15)
BUN: 33 mg/dL — ABNORMAL HIGH (ref 6–20)
CALCIUM: 9.2 mg/dL (ref 8.9–10.3)
CO2: 21 mmol/L — ABNORMAL LOW (ref 22–32)
Chloride: 102 mmol/L (ref 101–111)
Creatinine, Ser: 3.17 mg/dL — ABNORMAL HIGH (ref 0.44–1.00)
GFR, EST AFRICAN AMERICAN: 18 mL/min — AB (ref >60)
GFR, EST NON AFRICAN AMERICAN: 15 mL/min — AB (ref >60)
GLUCOSE: 396 mg/dL — AB (ref 65–99)
POTASSIUM: 5.2 mmol/L — AB (ref 3.5–5.1)
Sodium: 133 mmol/L — ABNORMAL LOW (ref 135–145)

## 2017-07-13 NOTE — ED Notes (Signed)
Pt is lying on her lt side in the lobby. Per NF Mortimer Fries pt is complaining of chest pain. Pt is ask to sit up to move to wheel chair so this tech and get a EKG on pt. Pt refuses to move or get into a wheel chair. NF Mortimer Fries ask pt to move to a wheel chair so a EKG can be gotten. Pt refuses and RN explains to pt she needs get in a wheel chair if she has a bed. Pt still states she can't and a bed would have to be brought out to the lobby when she does get a bed.

## 2017-07-13 NOTE — ED Triage Notes (Signed)
Per [facility]: Pt called out for bilateral ankle pain and swelling. Edema noted in both ankles, non-pitting. Pt c/o chronic R leg pain (hx sciatica). EMS states patient was standing outside of [employer] for 2 hours because they wouldn't let her in since she had too many clothes with her. Pt states she is stressed out and when she's stressed, her sugar runs high. EMS: CBG 423, 168/74, P 92 regular strong bilaterally, R 18. Patient ambulatory to truck without difficulties. Pt states she hasn't taken any of her medications today because they wouldn't let her in, "so she missed her insulin, seizure medication, lasix, etc."

## 2017-07-14 ENCOUNTER — Emergency Department (HOSPITAL_COMMUNITY): Payer: Medicare Other

## 2017-07-14 ENCOUNTER — Observation Stay (HOSPITAL_COMMUNITY)
Admission: EM | Admit: 2017-07-14 | Discharge: 2017-07-15 | Disposition: A | Discharge status: Home / Self Care | Payer: Medicare Other | Attending: Internal Medicine | Admitting: Internal Medicine

## 2017-07-14 ENCOUNTER — Observation Stay (HOSPITAL_COMMUNITY): Payer: Medicare Other

## 2017-07-14 ENCOUNTER — Encounter (HOSPITAL_COMMUNITY): Payer: Self-pay | Admitting: Internal Medicine

## 2017-07-14 DIAGNOSIS — E875 Hyperkalemia: Secondary | ICD-10-CM

## 2017-07-14 DIAGNOSIS — Z794 Long term (current) use of insulin: Secondary | ICD-10-CM

## 2017-07-14 DIAGNOSIS — N189 Chronic kidney disease, unspecified: Secondary | ICD-10-CM

## 2017-07-14 DIAGNOSIS — E1165 Type 2 diabetes mellitus with hyperglycemia: Secondary | ICD-10-CM

## 2017-07-14 DIAGNOSIS — N179 Acute kidney failure, unspecified: Secondary | ICD-10-CM

## 2017-07-14 DIAGNOSIS — R600 Localized edema: Secondary | ICD-10-CM

## 2017-07-14 HISTORY — DX: Type 2 diabetes mellitus without complications: E11.9

## 2017-07-14 HISTORY — DX: Anemia, unspecified: D64.9

## 2017-07-14 HISTORY — DX: Unspecified psychosis not due to a substance or known physiological condition: F29

## 2017-07-14 LAB — URINALYSIS, ROUTINE W REFLEX MICROSCOPIC
BILIRUBIN URINE: NEGATIVE
Hgb urine dipstick: NEGATIVE
Ketones, ur: NEGATIVE mg/dL
Leukocytes, UA: NEGATIVE
NITRITE: NEGATIVE
PH: 6 (ref 5.0–8.0)
Protein, ur: >=300 mg/dL — AB
SPECIFIC GRAVITY, URINE: 1.011 (ref 1.005–1.030)

## 2017-07-14 LAB — CREATININE, SERUM
Creatinine, Ser: 2.81 mg/dL — ABNORMAL HIGH (ref 0.44–1.00)
GFR, EST AFRICAN AMERICAN: 21 mL/min — AB (ref >60)
GFR, EST NON AFRICAN AMERICAN: 18 mL/min — AB (ref >60)

## 2017-07-14 LAB — RAPID URINE DRUG SCREEN, HOSP PERFORMED
AMPHETAMINES: NOT DETECTED
BENZODIAZEPINES: NOT DETECTED
Barbiturates: NOT DETECTED
Cocaine: NOT DETECTED
OPIATES: NOT DETECTED
TETRAHYDROCANNABINOL: NOT DETECTED

## 2017-07-14 LAB — CBG MONITORING, ED
Glucose-Capillary: 288 mg/dL — ABNORMAL HIGH (ref 65–99)
Glucose-Capillary: 308 mg/dL — ABNORMAL HIGH (ref 65–99)

## 2017-07-14 LAB — I-STAT TROPONIN, ED: Troponin i, poc: 0 ng/mL (ref 0.00–0.08)

## 2017-07-14 LAB — HEMOGLOBIN A1C
Hgb A1c MFr Bld: 10.2 % — ABNORMAL HIGH (ref 4.8–5.6)
Mean Plasma Glucose: 246.04 mg/dL

## 2017-07-14 LAB — TROPONIN I
Troponin I: <0.03 ng/mL (ref <0.03)
Troponin I: <0.03 ng/mL (ref <0.03)

## 2017-07-14 LAB — GLUCOSE, CAPILLARY
GLUCOSE-CAPILLARY: 158 mg/dL — AB (ref 65–99)
Glucose-Capillary: 85 mg/dL (ref 65–99)

## 2017-07-14 LAB — SODIUM, URINE, RANDOM: Sodium, Ur: 64 mmol/L

## 2017-07-14 LAB — VITAMIN B12: Vitamin B-12: 713 pg/mL (ref 180–914)

## 2017-07-14 LAB — FERRITIN: FERRITIN: 47 ng/mL (ref 11–307)

## 2017-07-14 LAB — BRAIN NATRIURETIC PEPTIDE: B NATRIURETIC PEPTIDE 5: 5.9 pg/mL (ref 0.0–100.0)

## 2017-07-14 LAB — IRON AND TIBC
Iron: 35 ug/dL (ref 28–170)
SATURATION RATIOS: 14 % (ref 10.4–31.8)
TIBC: 258 ug/dL (ref 250–450)
UIBC: 223 ug/dL

## 2017-07-14 LAB — TSH: TSH: 2.701 u[IU]/mL (ref 0.350–4.500)

## 2017-07-14 MED ORDER — SODIUM CHLORIDE 0.9 % IV SOLN
INTRAVENOUS | Status: DC
  Administered 2017-07-14: 07:00:00 via INTRAVENOUS

## 2017-07-14 MED ORDER — FERROUS FUMARATE 324 (106 FE) MG PO TABS
1.0000 | ORAL_TABLET | Freq: Every day | ORAL | Status: DC
  Administered 2017-07-15: 106 mg via ORAL
  Filled 2017-07-14 (×2): qty 1

## 2017-07-14 MED ORDER — ACETAMINOPHEN 650 MG RE SUPP: 650.0000 mg | Freq: Four times a day (QID) | RECTAL | Status: DC | PRN

## 2017-07-14 MED ORDER — PNEUMOCOCCAL VAC POLYVALENT 25 MCG/0.5ML IJ INJ
0.5000 mL | INJECTION | INTRAMUSCULAR | Status: DC
  Filled 2017-07-14: qty 0.5

## 2017-07-14 MED ORDER — HYDRALAZINE HCL 25 MG PO TABS
25.0000 mg | ORAL_TABLET | Freq: Three times a day (TID) | ORAL | Status: DC
  Administered 2017-07-14 – 2017-07-15 (×4): 25 mg via ORAL
  Filled 2017-07-14 (×4): qty 1

## 2017-07-14 MED ORDER — SODIUM CHLORIDE 0.9 % IV SOLN
INTRAVENOUS | Status: AC
  Administered 2017-07-14 – 2017-07-15 (×2): via INTRAVENOUS

## 2017-07-14 MED ORDER — ACETAMINOPHEN 325 MG PO TABS
650.0000 mg | ORAL_TABLET | Freq: Four times a day (QID) | ORAL | Status: DC | PRN
  Administered 2017-07-15: 650 mg via ORAL
  Filled 2017-07-14: qty 2

## 2017-07-14 MED ORDER — LEVETIRACETAM 500 MG PO TABS
1000.0000 mg | ORAL_TABLET | Freq: Two times a day (BID) | ORAL | Status: DC
  Administered 2017-07-14 – 2017-07-15 (×3): 1000 mg via ORAL
  Filled 2017-07-14 (×3): qty 2

## 2017-07-14 MED ORDER — INSULIN ASPART 100 UNIT/ML ~~LOC~~ SOLN
0.0000 [IU] | Freq: Three times a day (TID) | SUBCUTANEOUS | Status: DC
  Administered 2017-07-14: 15 [IU] via SUBCUTANEOUS
  Administered 2017-07-15: 5 [IU] via SUBCUTANEOUS
  Administered 2017-07-15: 8 [IU] via SUBCUTANEOUS
  Filled 2017-07-14: qty 1

## 2017-07-14 MED ORDER — HEPARIN SODIUM (PORCINE) 5000 UNIT/ML IJ SOLN
5000.0000 [IU] | Freq: Three times a day (TID) | INTRAMUSCULAR | Status: DC
  Administered 2017-07-14 – 2017-07-15 (×3): 5000 [IU] via SUBCUTANEOUS
  Filled 2017-07-14 (×2): qty 1

## 2017-07-14 MED ORDER — BENZTROPINE MESYLATE 2 MG PO TABS
2.0000 mg | ORAL_TABLET | Freq: Two times a day (BID) | ORAL | Status: DC
  Administered 2017-07-14 – 2017-07-15 (×3): 2 mg via ORAL
  Filled 2017-07-14 (×2): qty 1
  Filled 2017-07-14: qty 2
  Filled 2017-07-14: qty 1

## 2017-07-14 MED ORDER — INSULIN ASPART 100 UNIT/ML ~~LOC~~ SOLN: 0.0000 [IU] | Freq: Every day | SUBCUTANEOUS | Status: DC

## 2017-07-14 MED ORDER — INSULIN GLARGINE 100 UNIT/ML ~~LOC~~ SOLN
60.0000 [IU] | Freq: Every day | SUBCUTANEOUS | Status: DC
  Administered 2017-07-14 – 2017-07-15 (×2): 60 [IU] via SUBCUTANEOUS
  Filled 2017-07-14 (×2): qty 0.6

## 2017-07-14 NOTE — ED Notes (Signed)
ED Provider at bedside. 

## 2017-07-14 NOTE — ED Notes (Signed)
Lunch tray ordered with fruit vegetables and baked potato

## 2017-07-14 NOTE — H&P (Addendum)
TRH H&P   Patient Demographics:    Amber Lara, is a 56 y.o. female  MRN: [id number]   DOB - 1961/04/05  Admit Date - 07/14/2017  Outpatient Primary MD for the patient is Amber Alanis, NP  Referring MD/NP/PA: Veryl Speak  Outpatient Specialists:   Patient coming from:  [facility]  Chief Complaint  Patient presents with  . Leg Swelling      HPI:    Amber Lara  is a 56 y.o. female, w Dm2, CKD stage 4, Anemia, apparently presents with c/o hyperglycemia and presented to ED for evaluation. Also noted some lower ext edema. As well as chest pain "tightness" starting around 9pm.  No radiation.  Slight cough, yellow and green sputum. Slight dyspnea.  Denies fever, chills, palp, n/v, diarrhea, brbpr. +anxiety. Slight discomfort at this point in time.   In ED, BS 396,  CXR negative. Bun/creatinine 33/3.17, K=5.2, Na=133.  hgb 9.5,  Pt will be admitted for w/up of ARF on CRF and chest pain.       Review of systems:    In addition to the HPI above,  No Fever-chills, No Headache, No changes with Vision or hearing, No problems swallowing food or Liquids,  No Abdominal pain, No Nausea or Vommitting, Bowel movements are regular, No Blood in stool or Urine, No dysuria, No new skin rashes or bruises, No new joints pains-aches,  No new weakness, tingling, numbness in any extremity, No recent weight gain or loss, No polyuria, polydypsia or polyphagia, No significant Mental Stressors.  A full 10 point Review of Systems was done, except as stated above, all other Review of Systems were negative.   With Past History of the following :    Past Medical History:  Diagnosis Date  . Diabetes mellitus without complication ([place])   . Hypertension   . Seizures ([place])       Past Surgical History:  Procedure Laterality Date  . ABDOMINAL HYSTERECTOMY        Social  History:     Social History  Substance Use Topics  . Smoking status: Former Research scientist (life sciences)  . Smokeless tobacco: Never Used  . Alcohol use No     Lives - at [facility] - walks by self   Family History :     Family History  Problem Relation Age of Onset  . Diabetes Mellitus II Mother   . Diabetes Mellitus II Sister   . Diabetes Mellitus II Brother       Home Medications:   Prior to Admission medications   Medication Sig Start Date End Date Taking? Authorizing Provider  benztropine (COGENTIN) 2 MG tablet Take 2 mg by mouth 2 (two) times daily. 03/24/17  Yes [provider]  Ferrous Fumarate (HEMOCYTE - 106 MG FE) 324 (106 Fe) MG TABS tablet Take 1 tablet by mouth daily. 06/08/17  Yes  [provider]  furosemide (LASIX) 20 MG tablet Take 1 tablet (20 mg total) by mouth 2 (two) times daily. 06/23/17  Yes Samuella Cota, MD  LANTUS SOLOSTAR 100 UNIT/ML Solostar Pen Inject 70 Units into the skin every evening. 06/23/17  Yes Samuella Cota, MD  levETIRAcetam (KEPPRA) 1000 MG tablet Take 1,000 mg by mouth 2 (two) times daily. 05/09/17  Yes [provider]  lisinopril (PRINIVIL,ZESTRIL) 30 MG tablet Take 30 mg by mouth daily. 06/08/17  Yes [provider]  NOVOLOG FLEXPEN 100 UNIT/ML FlexPen Per sliding scale given by your Ms. Belva Agee, your regular primary care provider. Breakfast, lunch, dinner. Patient taking differently: Inject 1-15 Units into the skin 3 (three) times daily with meals. Per sliding scale 06/23/17  Yes Samuella Cota, MD  metroNIDAZOLE (FLAGYL) 500 MG tablet Take 1 tablet (500 mg total) by mouth every 12 (twelve) hours. Patient not taking: Reported on 07/14/2017 06/23/17   Samuella Cota, MD     Allergies:     Allergies  Allergen Reactions  . Tetracyclines & Related Anaphylaxis and Hives  . Morphine And Related Hives  . Penicillins     Has patient had a PCN reaction causing immediate rash,  facial/tongue/throat swelling, SOB or lightheadedness with hypotension: yes Has patient had a PCN reaction causing severe rash involving mucus membranes or skin n24} If all of the above answers are "NO", then may proceed with Cephalosporin use.ecrosis: no Has patient had a PCN reaction that required hospitalization: no Has patient had a PCN reaction occurring within the last 10 years:no     Physical Exam:   Vitals  Blood pressure (!) 159/76, pulse 69, temperature 97.9 F (36.6 C), temperature source Oral, resp. rate 14, SpO2 99 %.   1. General  lying in bed in NAD,    2. Normal affect and insight, Not Suicidal or Homicidal, Awake Alert, Oriented X 3.  3. No F.N deficits, ALL C.Nerves Intact, Strength 5/5 all 4 extremities, Sensation intact all 4 extremities, Plantars down going.  4. Ears and Eyes appear Normal, Conjunctivae clear, PERRLA. Moist Oral Mucosa.  5. Supple Neck, No JVD, No cervical lymphadenopathy appriciated, No Carotid Bruits.  6. Symmetrical Chest wall movement, Good air movement bilaterally, CTAB.  7. RRR, No Gallops, Rubs or Murmurs, No Parasternal Heave.  8. Positive Bowel Sounds, Abdomen Soft, No tenderness, No organomegaly appriciated,No rebound -guarding or rigidity.  9.  No Cyanosis, Normal Skin Turgor, No Skin Rash or Bruise.  10. Good muscle tone,  joints appear normal , no effusions, Normal ROM.  11. No Palpable Lymph Nodes in Neck or Axillae     Data Review:    CBC  Recent Labs Lab 07/13/17 2153  WBC 6.6  HGB 9.5*  HCT 29.3*  PLT 278  MCV 80.5  MCH 26.1  MCHC 32.4  RDW 15.1   ------------------------------------------------------------------------------------------------------------------  Chemistries   Recent Labs Lab 07/13/17 2153  NA 133*  K 5.2*  CL 102  CO2 21*  GLUCOSE 396*  BUN 33*  CREATININE 3.17*  CALCIUM 9.2    ------------------------------------------------------------------------------------------------------------------ estimated creatinine clearance is 23.2 mL/min (A) (by C-G formula based on SCr of 3.17 mg/dL (H)). ------------------------------------------------------------------------------------------------------------------ No results for input(s): TSH, T4TOTAL, T3FREE, THYROIDAB in the last 72 hours.  Invalid input(s): FREET3  Coagulation profile No results for input(s): INR, PROTIME in the last 168 hours. ------------------------------------------------------------------------------------------------------------------- No results for input(s): DDIMER in the last 72 hours. -------------------------------------------------------------------------------------------------------------------  Cardiac Enzymes No results for input(s): CKMB,  TROPONINI, MYOGLOBIN in the last 168 hours.  Invalid input(s): CK ------------------------------------------------------------------------------------------------------------------    Component Value Date/Time   BNP 5.9 07/14/2017 0312     ---------------------------------------------------------------------------------------------------------------  Urinalysis No results found for: COLORURINE, APPEARANCEUR, LABSPEC, [place], GLUCOSEU, HGBUR, BILIRUBINUR, KETONESUR, PROTEINUR, UROBILINOGEN, NITRITE, LEUKOCYTESUR  ----------------------------------------------------------------------------------------------------------------   Imaging Results:    Dg Chest 2 View  Result Date: 07/14/2017 CLINICAL DATA:  Bilateral lower extremity swelling and high blood sugar today. History of hypertension and diabetes. Nonsmoker. EXAM: CHEST  2 VIEW COMPARISON:  None. FINDINGS: The heart size and mediastinal contours are within normal limits. Both lungs are clear. The visualized skeletal structures are unremarkable. IMPRESSION: No active cardiopulmonary disease.  Electronically Signed   By: Lucienne Capers M.D.   On: 07/14/2017 03:35    Assessment & Plan:    Principal Problem:   Chest pain Active Problems:   Type 2 diabetes mellitus with hyperglycemia (HCC)   Acute kidney injury superimposed on CKD (HCC)   Hyperkalemia     Cp      Tele Trop I q6h x3 12 lead ekg Check cardiac echo Nuclear stress test Cardiology consulted by email  ARF on CRF (baseline creatinine 2.4) Check urine sodium, urine creatinine, urine eosinophils Check renal ultrasound Check spep STOP Lisinopril Hold Lasix this am.   Hyperkalemia STOP Lisinopril Check cmp in am   Anemia Check cbc , ferritin, iron, tibc, folate, b12, tsh, spep, upep  Dm2, u ncontrolled with diabetic nephropathy HOLD Lantus fsbs q4h, ISS  Hypertension Hydralazine 25mg  po tid STOP Lisinopril due to renal insufficiency  DVT Prophylaxis Heparin -  SCDs   AM Labs Ordered, also please review Full Orders  Family Communication: Admission, patients condition and plan of care including tests being ordered have been discussed with the patient who indicate understanding and agree with the plan and Code Status.  Code Status FULL Code  Likely DC to  home  Condition GUARDED    Consults called: none  Admission status: observation   Time spent in minutes : 45   Jani Gravel M.D on 07/14/2017 at 5:22 AM  Between 7am to 7pm - Pager - [phone] . After 7pm go to [email or web address] - password [facility]  [facility] - Office  [phone]

## 2017-07-14 NOTE — ED Provider Notes (Signed)
[facility] DEPT Provider Note   CSN: [id number] Arrival date & time: 07/13/17  2127     History   Chief Complaint Chief Complaint  Patient presents with  . Leg Swelling    HPI Amber Lara is a 56 y.o. female.  Patient is a 56 year old female with past medical history of chronic renal insufficiency, hypertension, diabetes. She presents today for evaluation of leg swelling. This is been worsening over the past several weeks, however became much worse today after being on her feet for prolonged period of time. She reports feeling short of breath. She denies any fevers or chills. She does report experiencing some chest discomfort while in the waiting room.   The history is provided by the patient.    Past Medical History:  Diagnosis Date  . Diabetes mellitus without complication ([place])   . Hypertension   . Seizures [facility])     Patient Active Problem List   Diagnosis Date Noted  . Type 2 diabetes mellitus with hyperglycemia ([place]) 06/23/2017  . Normochromic normocytic anemia 06/23/2017  . Essential hypertension 06/23/2017  . Acute kidney injury superimposed on CKD ([place]) 06/23/2017    Past Surgical History:  Procedure Laterality Date  . ABDOMINAL HYSTERECTOMY      OB History    No data available       Home Medications    Prior to Admission medications   Medication Sig Start Date End Date Taking? Authorizing Provider  benztropine (COGENTIN) 2 MG tablet Take 2 mg by mouth 2 (two) times daily. 03/24/17  Yes [provider]  Ferrous Fumarate (HEMOCYTE - 106 MG FE) 324 (106 Fe) MG TABS tablet Take 1 tablet by mouth daily. 06/08/17  Yes [provider]  furosemide (LASIX) 20 MG tablet Take 1 tablet (20 mg total) by mouth 2 (two) times daily. 06/23/17  Yes Samuella Cota, MD  LANTUS SOLOSTAR 100 UNIT/ML Solostar Pen Inject 70 Units into the skin every evening. 06/23/17  Yes Samuella Cota, MD  levETIRAcetam (KEPPRA) 1000 MG tablet Take 1,000 mg  by mouth 2 (two) times daily. 05/09/17  Yes [provider]  lisinopril (PRINIVIL,ZESTRIL) 30 MG tablet Take 30 mg by mouth daily. 06/08/17  Yes [provider]  NOVOLOG FLEXPEN 100 UNIT/ML FlexPen Per sliding scale given by your Ms. Belva Agee, your regular primary care provider. Breakfast, lunch, dinner. Patient taking differently: Inject 1-15 Units into the skin 3 (three) times daily with meals. Per sliding scale 06/23/17  Yes Samuella Cota, MD  metroNIDAZOLE (FLAGYL) 500 MG tablet Take 1 tablet (500 mg total) by mouth every 12 (twelve) hours. Patient not taking: Reported on 07/14/2017 06/23/17   Samuella Cota, MD    Family History Family History  Problem Relation Age of Onset  . Diabetes Mellitus II Mother   . Diabetes Mellitus II Sister   . Diabetes Mellitus II Brother     Social History Social History  Substance Use Topics  . Smoking status: Former Research scientist (life sciences)  . Smokeless tobacco: Never Used  . Alcohol use No     Allergies   Tetracyclines & related; Morphine and related; and Penicillins   Review of Systems Review of Systems  All other systems reviewed and are negative.    Physical Exam Updated Vital Signs BP (!) 153/68 (BP Location: Left Arm)   Pulse 80   Temp 97.9 F (36.6 C) (Oral)   Resp 18   SpO2 97%   Physical Exam  Constitutional: She is oriented to person,  place, and time. She appears well-developed and well-nourished. No distress.  HENT:  Head: Normocephalic and atraumatic.  Neck: Normal range of motion. Neck supple.  Cardiovascular: Normal rate and regular rhythm.  Exam reveals no gallop and no friction rub.   No murmur heard. Pulmonary/Chest: Effort normal and breath sounds normal. No respiratory distress. She has no wheezes. She has no rales.  Abdominal: Soft. Bowel sounds are normal. She exhibits no distension. There is no tenderness.  Musculoskeletal: Normal range of motion. She exhibits edema.  There is 1-2+ pitting edema of  both lower extremities.  Neurological: She is alert and oriented to person, place, and time.  Skin: Skin is warm and dry. She is not diaphoretic.  Nursing note and vitals reviewed.    ED Treatments / Results  Labs (all labs ordered are listed, but only abnormal results are displayed) Labs Reviewed  BASIC METABOLIC PANEL - Abnormal; Notable for the following:       Result Value   Sodium 133 (*)    Potassium 5.2 (*)    CO2 21 (*)    Glucose, Bld 396 (*)    BUN 33 (*)    Creatinine, Ser 3.17 (*)    GFR calc non Af Amer 15 (*)    GFR calc Af Amer 18 (*)    All other components within normal limits  CBC - Abnormal; Notable for the following:    RBC 3.64 (*)    Hemoglobin 9.5 (*)    HCT 29.3 (*)    All other components within normal limits  URINALYSIS, ROUTINE W REFLEX MICROSCOPIC  BRAIN NATRIURETIC PEPTIDE  CBG MONITORING, ED  I-STAT TROPONIN, ED    EKG  EKG Interpretation None       Radiology No results found.  Procedures Procedures (including critical care time)  Medications Ordered in ED Medications - No data to display   Initial Impression / Assessment and Plan / ED Course  I have reviewed the triage vital signs and the nursing notes.  Pertinent labs & imaging results that were available during my care of the patient were reviewed by me and considered in my medical decision making (see chart for details).  Patient with leg edema and worsening renal function. She also has a mild hyperkalemia and uncontrolled diabetes. I discussed these findings with Dr. Maudie Mercury from the hospitalist service who will admit for close observation of her renal function, tighter control of her diabetes, and possible diuresis.  Final Clinical Impressions(s) / ED Diagnoses   Final diagnoses:  None    New Prescriptions New Prescriptions   No medications on file     Veryl Speak, MD 07/14/17 [phone]

## 2017-07-14 NOTE — ED Notes (Signed)
Pt given drink and graham crackers

## 2017-07-14 NOTE — ED Notes (Signed)
Admitting at bedside 
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
## 2017-07-14 NOTE — ED Notes (Signed)
Attempted to provide [place] sand for pt. Pt refusing wanting something from cafeteria. RN notified.Marland Kitchen
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
## 2017-07-14 NOTE — ED Notes (Signed)
cbg was [id number]

## 2017-07-15 ENCOUNTER — Observation Stay (HOSPITAL_BASED_OUTPATIENT_CLINIC_OR_DEPARTMENT_OTHER): Payer: Medicare Other

## 2017-07-15 DIAGNOSIS — R011 Cardiac murmur, unspecified: Secondary | ICD-10-CM

## 2017-07-15 DIAGNOSIS — R079 Chest pain, unspecified: Secondary | ICD-10-CM

## 2017-07-15 DIAGNOSIS — I340 Nonrheumatic mitral (valve) insufficiency: Secondary | ICD-10-CM

## 2017-07-15 LAB — GLUCOSE, CAPILLARY
GLUCOSE-CAPILLARY: 227 mg/dL — AB (ref 65–99)
GLUCOSE-CAPILLARY: 283 mg/dL — AB (ref 65–99)
GLUCOSE-CAPILLARY: 171 mg/dL — AB (ref 65–99)

## 2017-07-15 LAB — COMPREHENSIVE METABOLIC PANEL
ALK PHOS: 79 U/L (ref 38–126)
ALT: 15 U/L (ref 14–54)
ANION GAP: 8 (ref 5–15)
AST: 18 U/L (ref 15–41)
Albumin: 2.5 g/dL — ABNORMAL LOW (ref 3.5–5.0)
BILIRUBIN TOTAL: 0.6 mg/dL (ref 0.3–1.2)
BUN: 28 mg/dL — ABNORMAL HIGH (ref 6–20)
CHLORIDE: 105 mmol/L (ref 101–111)
CO2: 21 mmol/L — AB (ref 22–32)
Calcium: 8.5 mg/dL — ABNORMAL LOW (ref 8.9–10.3)
Creatinine, Ser: 2.37 mg/dL — ABNORMAL HIGH (ref 0.44–1.00)
GFR calc non Af Amer: 22 mL/min — ABNORMAL LOW (ref >60)
GFR, EST AFRICAN AMERICAN: 25 mL/min — AB (ref >60)
Glucose, Bld: 222 mg/dL — ABNORMAL HIGH (ref 65–99)
POTASSIUM: 4.2 mmol/L (ref 3.5–5.1)
Sodium: 134 mmol/L — ABNORMAL LOW (ref 135–145)
Total Protein: 6 g/dL — ABNORMAL LOW (ref 6.5–8.1)

## 2017-07-15 LAB — CBC
HEMATOCRIT: 28.8 % — AB (ref 36.0–46.0)
HEMOGLOBIN: 9.1 g/dL — AB (ref 12.0–15.0)
MCH: 25.4 pg — AB (ref 26.0–34.0)
MCHC: 31.6 g/dL (ref 30.0–36.0)
MCV: 80.4 fL (ref 78.0–100.0)
Platelets: 235 10*3/uL (ref 150–400)
RBC: 3.58 MIL/uL — ABNORMAL LOW (ref 3.87–5.11)
RDW: 14.6 % (ref 11.5–15.5)
WBC: 6.5 10*3/uL (ref 4.0–10.5)

## 2017-07-15 LAB — ECHOCARDIOGRAM COMPLETE
Height: 63 in
Weight: 3744 oz

## 2017-07-15 LAB — CALCIUM / CREATININE RATIO, URINE
CALCIUM UR: 0.9 mg/dL
CALCIUM/CREAT. RATIO: 14 mg/g{creat} (ref 0–260)
CREATININE, UR: 65.8 mg/dL

## 2017-07-15 MED ORDER — HYDRALAZINE HCL 25 MG PO TABS: 25.0000 mg | ORAL_TABLET | Freq: Three times a day (TID) | ORAL | 0 refills | Status: DC

## 2017-07-15 MED ORDER — ACETAMINOPHEN 325 MG PO TABS: 650.0000 mg | ORAL_TABLET | Freq: Four times a day (QID) | ORAL | Status: AC | PRN

## 2017-07-15 MED ORDER — LANTUS SOLOSTAR 100 UNIT/ML ~~LOC~~ SOPN: 70.0000 [IU] | PEN_INJECTOR | Freq: Every day | SUBCUTANEOUS | Status: DC

## 2017-07-15 MED ORDER — DICLOFENAC SODIUM 1 % TD GEL: 2.0000 g | Freq: Four times a day (QID) | TRANSDERMAL | 0 refills | Status: DC

## 2017-07-15 MED ORDER — DICLOFENAC SODIUM 1 % TD GEL
2.0000 g | Freq: Four times a day (QID) | TRANSDERMAL | Status: DC
  Administered 2017-07-15: 2 g via TOPICAL
  Filled 2017-07-15: qty 100

## 2017-07-15 MED ORDER — FUROSEMIDE 20 MG PO TABS: 20.0000 mg | ORAL_TABLET | Freq: Every day | ORAL | 0 refills | Status: DC
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
## 2017-07-15 NOTE — Progress Notes (Signed)
Progress Note  Patient Name: Amber Lara Date of Encounter: 07/15/2017  Primary Cardiologist:   New Dr. Debara Lara  Subjective   Mild chest pain unchanged from previous.  No acute complaints such as SOB  Inpatient Medications    Scheduled Meds: . benztropine  2 mg Oral BID  . Ferrous Fumarate  1 tablet Oral Daily  . heparin  5,000 Units Subcutaneous Q8H  . hydrALAZINE  25 mg Oral Q8H  . insulin aspart  0-15 Units Subcutaneous TID WC  . insulin aspart  0-5 Units Subcutaneous QHS  . insulin glargine  60 Units Subcutaneous Daily  . levETIRAcetam  1,000 mg Oral BID  . pneumococcal 23 valent vaccine  0.5 mL Intramuscular Tomorrow-1000   Continuous Infusions:  PRN Meds: acetaminophen **OR** acetaminophen   Vital Signs    Vitals:   07/14/17 1700 07/14/17 2304 07/15/17 0541 07/15/17 0900  BP:  (!) 151/62 (!) 152/76 134/68  Pulse:  78 73 72  Resp:  18 18 18   Temp:  98.5 F (36.9 C) 98.1 F (36.7 C) 97.8 F (36.6 C)  TempSrc:  Oral Oral Oral  SpO2:  100% 100% 100%  Weight: 220 lb (99.8 kg) 234 lb (106.1 kg)    Height: 5\' 3"  (1.6 m)       Intake/Output Summary (Last 24 hours) at 07/15/17 1040 Last data filed at 07/15/17 0601  Gross per 24 hour  Intake              840 ml  Output                0 ml  Net              840 ml   Filed Weights   07/14/17 1700 07/14/17 2304  Weight: 220 lb (99.8 kg) 234 lb (106.1 kg)    Telemetry    NSR - Personally Reviewed  ECG    NA - Personally Reviewed  Physical Exam   GEN: No acute distress.   Neck: No  JVD Cardiac: RRR, no murmurs, rubs, or gallops.  Respiratory: Clear  to auscultation bilaterally. GI: Soft, nontender, non-distended  MS: No  edema; No deformity. Neuro:  Nonfocal  Psych: Normal affect   Labs    Chemistry Recent Labs Lab 07/13/17 2153 07/14/17 0941 07/15/17 0245  NA 133*  --  134*  K 5.2*  --  4.2  CL 102  --  105  CO2 21*  --  21*  GLUCOSE 396*  --  222*  BUN 33*  --  28*  CREATININE  3.17* 2.81* 2.37*  CALCIUM 9.2  --  8.5*  PROT  --   --  6.0*  ALBUMIN  --   --  2.5*  AST  --   --  18  ALT  --   --  15  ALKPHOS  --   --  79  BILITOT  --   --  0.6  GFRNONAA 15* 18* 22*  GFRAA 18* 21* 25*  ANIONGAP 10  --  8     Hematology Recent Labs Lab 07/13/17 2153 07/15/17 0245  WBC 6.6 6.5  RBC 3.64* 3.58*  HGB 9.5* 9.1*  HCT 29.3* 28.8*  MCV 80.5 80.4  MCH 26.1 25.4*  MCHC 32.4 31.6  RDW 15.1 14.6  PLT 278 235    Cardiac Enzymes Recent Labs Lab 07/14/17 0941 07/14/17 1649 07/14/17 2035  TROPONINI <0.03 <0.03 <0.03    Recent Labs Lab 07/14/17 0316  TROPIPOC 0.00     BNP Recent Labs Lab 07/14/17 0312  BNP 5.9     DDimer No results for input(s): DDIMER in the last 168 hours.   Radiology    Dg Chest 2 View  Result Date: 07/14/2017 CLINICAL DATA:  Bilateral lower extremity swelling and high blood sugar today. History of hypertension and diabetes. Nonsmoker. EXAM: CHEST  2 VIEW COMPARISON:  None. FINDINGS: The heart size and mediastinal contours are within normal limits. Both lungs are clear. The visualized skeletal structures are unremarkable. IMPRESSION: No active cardiopulmonary disease. Electronically Signed   By: Lucienne Capers M.D.   On: 07/14/2017 03:35   US Renal  Result Date: 07/14/2017 CLINICAL DATA:  56 year old female acute renal failure. EXAM: RENAL / URINARY TRACT ULTRASOUND COMPLETE COMPARISON:  None. FINDINGS: Right Kidney: Length: 11.9 cm. Echogenicity within normal limits. No mass or hydronephrosis visualized. Left Kidney: Length: 12.1 cm. Echogenicity within normal limits. No mass or hydronephrosis visualized. Bladder: Appears normal for degree of bladder distention. IMPRESSION: Normal ultrasound appearance of both kidneys and the urinary bladder. Electronically Signed   By: Genevie Ann M.D.   On: 07/14/2017 09:16    Cardiac Studies   ECHO:  Pending  Patient Profile     56 y.o. female with a hx of insulin-dependent diabetes,  CKD stage IV, presumed anemia of chronic disease, psychosis, seizures, heart murmur, prior positive methamphetamines on UDS who is being seen for the evaluation of chest pain at the request of Dr. Maudie Mercury.  She also has a murmur.    Assessment & Plan    CHEST PAIN:    Enzymes negative.  Not thought to be angina.  No objective evidence of ischemia.  No further work up.   MURMUR:  Echo still final pending.   I reviewed the echo being done at the bedside and there is no MVP or regurgitation.  We will comment after the final result but I do not suspect that she will need any further cardiac work up.    Signed, Minus Breeding, MD  07/15/2017, 10:40 AM

## 2017-07-15 NOTE — Discharge Instructions (Signed)

## 2017-07-15 NOTE — Progress Notes (Signed)
  Echocardiogram 2D Echocardiogram has been performed.  Gabbrielle Mcnicholas T Mieka Leaton 07/15/2017, 11:14 AM

## 2017-07-17 LAB — FOLATE RBC
FOLATE, HEMOLYSATE: 445.9 ng/mL
Folate, RBC: 1522 ng/mL (ref >498)
Hematocrit: 29.3 % — ABNORMAL LOW (ref 34.0–46.6)

## 2017-07-19 ENCOUNTER — Other Ambulatory Visit: Payer: Self-pay | Admitting: Pharmacist

## 2017-07-19 ENCOUNTER — Encounter: Payer: Self-pay | Admitting: Family Medicine

## 2017-07-19 ENCOUNTER — Ambulatory Visit (INDEPENDENT_AMBULATORY_CARE_PROVIDER_SITE_OTHER): Payer: Self-pay | Admitting: Family Medicine

## 2017-07-19 VITALS — BP 140/76 | HR 82 | Temp 97.7°F | Resp 14 | Ht 63.0 in

## 2017-07-19 DIAGNOSIS — Z1321 Encounter for screening for nutritional disorder: Secondary | ICD-10-CM | POA: Diagnosis not present

## 2017-07-19 DIAGNOSIS — Z1329 Encounter for screening for other suspected endocrine disorder: Secondary | ICD-10-CM | POA: Diagnosis not present

## 2017-07-19 DIAGNOSIS — I10 Essential (primary) hypertension: Secondary | ICD-10-CM

## 2017-07-19 DIAGNOSIS — E118 Type 2 diabetes mellitus with unspecified complications: Secondary | ICD-10-CM | POA: Diagnosis not present

## 2017-07-19 LAB — POCT URINALYSIS DIP (DEVICE)
BILIRUBIN URINE: NEGATIVE
Glucose, UA: 250 mg/dL — AB
KETONES UR: NEGATIVE mg/dL
Leukocytes, UA: NEGATIVE
Nitrite: NEGATIVE
PH: 6 (ref 5.0–8.0)
SPECIFIC GRAVITY, URINE: 1.015 (ref 1.005–1.030)
Urobilinogen, UA: 0.2 mg/dL (ref 0.0–1.0)

## 2017-07-19 LAB — GLUCOSE, CAPILLARY
Glucose-Capillary: 297 mg/dL — ABNORMAL HIGH (ref 65–99)
Glucose-Capillary: 254 mg/dL — ABNORMAL HIGH (ref 65–99)

## 2017-07-19 MED ORDER — HYDRALAZINE HCL 25 MG PO TABS: 25.0000 mg | ORAL_TABLET | Freq: Three times a day (TID) | ORAL | 2 refills | Status: DC

## 2017-07-19 MED ORDER — NOVOLOG FLEXPEN 100 UNIT/ML ~~LOC~~ SOPN: PEN_INJECTOR | SUBCUTANEOUS | 5 refills | Status: DC

## 2017-07-19 MED ORDER — "INSULIN SYRINGE-NEEDLE U-100 31G X 5/16"" 0.3 ML MISC": 5 refills | Status: DC

## 2017-07-19 MED ORDER — LEVETIRACETAM 1000 MG PO TABS: 1000.0000 mg | ORAL_TABLET | Freq: Two times a day (BID) | ORAL | 1 refills | Status: DC

## 2017-07-19 MED ORDER — INSULIN PEN NEEDLE 30G X 8 MM MISC: 1 refills | Status: DC

## 2017-07-19 MED ORDER — LANTUS SOLOSTAR 100 UNIT/ML ~~LOC~~ SOPN: 70.0000 [IU] | PEN_INJECTOR | Freq: Every day | SUBCUTANEOUS | Status: DC
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
## 2017-07-19 NOTE — Patient Instructions (Signed)
Local mental health resources: [facility] [place], [place]. [facility], [place], [place]   Diabetes Mellitus and Food It is important for you to manage your blood sugar (glucose) level. Your blood glucose level can be greatly affected by what you eat. Eating healthier foods in the appropriate amounts throughout the day at about the same time each day will help you control your blood glucose level. It can also help slow or prevent worsening of your diabetes mellitus. Healthy eating may even help you improve the level of your blood pressure and reach or maintain a healthy weight. General recommendations for healthful eating and cooking habits include:  Eating meals and snacks regularly. Avoid going long periods of time without eating to lose weight.  Eating a diet that consists mainly of plant-based foods, such as fruits, vegetables, nuts, legumes, and whole grains.  Using low-heat cooking methods, such as baking, instead of high-heat cooking methods, such as deep frying.  Work with your dietitian to make sure you understand how to use the Nutrition Facts information on food labels. How can food affect me? Carbohydrates Carbohydrates affect your blood glucose level more than any other type of food. Your dietitian will help you determine how many carbohydrates to eat at each meal and teach you how to count carbohydrates. Counting carbohydrates is important to keep your blood glucose at a healthy level, especially if you are using insulin or taking certain medicines for diabetes mellitus. Alcohol Alcohol can cause sudden decreases in blood glucose (hypoglycemia), especially if you use insulin or take certain medicines for diabetes mellitus. Hypoglycemia can be a life-threatening condition. Symptoms of hypoglycemia (sleepiness, dizziness, and disorientation) are similar to symptoms of having too much alcohol. If your  health care provider has given you approval to drink alcohol, do so in moderation and use the following guidelines:  Women should not have more than one drink per day, and men should not have more than two drinks per day. One drink is equal to: ? 12 oz of beer. ? 5 oz of wine. ? 1 oz of hard liquor.  Do not drink on an empty stomach.  Keep yourself hydrated. Have water, diet soda, or unsweetened iced tea.  Regular soda, juice, and other mixers might contain a lot of carbohydrates and should be counted.  What foods are not recommended? As you make food choices, it is important to remember that all foods are not the same. Some foods have fewer nutrients per serving than other foods, even though they might have the same number of calories or carbohydrates. It is difficult to get your body what it needs when you eat foods with fewer nutrients. Examples of foods that you should avoid that are high in calories and carbohydrates but low in nutrients include:  Trans fats (most processed foods list trans fats on the Nutrition Facts label).  Regular soda.  Juice.  Candy.  Sweets, such as cake, pie, doughnuts, and cookies.  Fried foods.  What foods can I eat? Eat nutrient-rich foods, which will nourish your body and keep you healthy. The food you should eat also will depend on several factors, including:  The calories you need.  The medicines you take.  Your weight.  Your blood glucose level.  Your blood pressure level.  Your cholesterol level.  You should eat a variety of foods, including:  Protein. ? Lean cuts of meat. ? Proteins low in saturated fats, such as fish, egg  whites, and beans. Avoid processed meats.  Fruits and vegetables. ? Fruits and vegetables that may help control blood glucose levels, such as apples, mangoes, and yams.  Dairy products. ? Choose fat-free or low-fat dairy products, such as milk, yogurt, and cheese.  Grains, bread, pasta, and  rice. ? Choose whole grain products, such as multigrain bread, whole oats, and brown rice. These foods may help control blood pressure.  Fats. ? Foods containing healthful fats, such as nuts, avocado, olive oil, canola oil, and fish.  Does everyone with diabetes mellitus have the same meal plan? Because every person with diabetes mellitus is different, there is not one meal plan that works for everyone. It is very important that you meet with a dietitian who will help you create a meal plan that is just right for you. This information is not intended to replace advice given to you by your health care provider. Make sure you discuss any questions you have with your health care provider. Document Released: 06/23/2005 Document Revised: 03/03/2016 Document Reviewed: 08/23/2013 Elsevier Interactive Patient Education  2017 Elsevier Inc.  Hypertension Hypertension is another name for high blood pressure. High blood pressure forces your heart to work harder to pump blood. This can cause problems over time. There are two numbers in a blood pressure reading. There is a top number (systolic) over a bottom number (diastolic). It is best to have a blood pressure below 120/80. Healthy choices can help lower your blood pressure. You may need medicine to help lower your blood pressure if:  Your blood pressure cannot be lowered with healthy choices.  Your blood pressure is higher than 130/80.  Follow these instructions at home: Eating and drinking  If directed, follow the DASH eating plan. This diet includes: ? Filling half of your plate at each meal with fruits and vegetables. ? Filling one quarter of your plate at each meal with whole grains. Whole grains include whole wheat pasta, brown rice, and whole grain bread. ? Eating or drinking low-fat dairy products, such as skim milk or low-fat yogurt. ? Filling one quarter of your plate at each meal with low-fat (lean) proteins. Low-fat proteins include  fish, skinless chicken, eggs, beans, and tofu. ? Avoiding fatty meat, cured and processed meat, or chicken with skin. ? Avoiding premade or processed food.  Eat less than 1,500 mg of salt (sodium) a day.  Limit alcohol use to no more than 1 drink a day for nonpregnant women and 2 drinks a day for men. One drink equals 12 oz of beer, 5 oz of wine, or 1 oz of hard liquor. Lifestyle  Work with your doctor to stay at a healthy weight or to lose weight. Ask your doctor what the best weight is for you.  Get at least 30 minutes of exercise that causes your heart to beat faster (aerobic exercise) most days of the week. This may include walking, swimming, or biking.  Get at least 30 minutes of exercise that strengthens your muscles (resistance exercise) at least 3 days a week. This may include lifting weights or pilates.  Do not use any products that contain nicotine or tobacco. This includes cigarettes and e-cigarettes. If you need help quitting, ask your doctor.  Check your blood pressure at home as told by your doctor.  Keep all follow-up visits as told by your doctor. This is important. Medicines  Take over-the-counter and prescription medicines only as told by your doctor. Follow directions carefully.  Do not skip doses of  blood pressure medicine. The medicine does not work as well if you skip doses. Skipping doses also puts you at risk for problems.  Ask your doctor about side effects or reactions to medicines that you should watch for. Contact a doctor if:  You think you are having a reaction to the medicine you are taking.  You have headaches that keep coming back (recurring).  You feel dizzy.  You have swelling in your ankles.  You have trouble with your vision. Get help right away if:  You get a very bad headache.  You start to feel confused.  You feel weak or numb.  You feel faint.  You get very bad pain in your: ? Chest. ? Belly (abdomen).  You throw up (vomit)  more than once.  You have trouble breathing. Summary  Hypertension is another name for high blood pressure.  Making healthy choices can help lower blood pressure. If your blood pressure cannot be controlled with healthy choices, you may need to take medicine. This information is not intended to replace advice given to you by your health care provider. Make sure you discuss any questions you have with your health care provider. Document Released: 03/14/2008 Document Revised: 08/24/2016 Document Reviewed: 08/24/2016 Elsevier Interactive Patient Education  [employer].

## 2017-07-20 MED ORDER — INJECTION DEVICE FOR INSULIN DEVI: Freq: Once | Status: DC

## 2017-07-21 ENCOUNTER — Other Ambulatory Visit: Payer: Self-pay

## 2017-07-21 DIAGNOSIS — Z1321 Encounter for screening for nutritional disorder: Secondary | ICD-10-CM

## 2017-07-21 DIAGNOSIS — E118 Type 2 diabetes mellitus with unspecified complications: Secondary | ICD-10-CM

## 2017-07-21 DIAGNOSIS — Z1329 Encounter for screening for other suspected endocrine disorder: Secondary | ICD-10-CM | POA: Diagnosis not present

## 2017-07-21 DIAGNOSIS — I10 Essential (primary) hypertension: Secondary | ICD-10-CM | POA: Diagnosis not present

## 2017-07-21 LAB — VITAMIN D 25 HYDROXY (VIT D DEFICIENCY, FRACTURES)

## 2017-07-21 LAB — MICROALBUMIN / CREATININE URINE RATIO
Creatinine, Urine: 46 mg/dL (ref 20–275)
Microalb Creat Ratio: 3230 mcg/mg creat — ABNORMAL HIGH (ref <30)
Microalb, Ur: 148.6 mg/dL

## 2017-07-21 LAB — CBC WITH DIFFERENTIAL/PLATELET

## 2017-07-21 LAB — COMPLETE METABOLIC PANEL WITH GFR

## 2017-07-21 LAB — THYROID PANEL WITH TSH

## 2017-07-22 LAB — CBC WITH DIFFERENTIAL/PLATELET
BASOS ABS: 32 {cells}/uL (ref 0–200)
Basophils Relative: 0.6 %
EOS PCT: 4.4 %
Eosinophils Absolute: 233 cells/uL (ref 15–500)
HCT: 32.8 % — ABNORMAL LOW (ref 35.0–45.0)
Hemoglobin: 10.3 g/dL — ABNORMAL LOW (ref 11.7–15.5)
Lymphs Abs: 1791 cells/uL (ref 850–3900)
MCH: 25.6 pg — AB (ref 27.0–33.0)
MCHC: 31.4 g/dL — AB (ref 32.0–36.0)
MCV: 81.6 fL (ref 80.0–100.0)
MONOS PCT: 6.5 %
MPV: 10.6 fL (ref 7.5–12.5)
Neutro Abs: 2899 cells/uL (ref 1500–7800)
Neutrophils Relative %: 54.7 %
Platelets: 307 10*3/uL (ref 140–400)
RBC: 4.02 10*6/uL (ref 3.80–5.10)
RDW: 15.1 % — AB (ref 11.0–15.0)
TOTAL LYMPHOCYTE: 33.8 %
WBC mixed population: 345 cells/uL (ref 200–950)
WBC: 5.3 10*3/uL (ref 3.8–10.8)

## 2017-07-22 LAB — COMPLETE METABOLIC PANEL WITH GFR
AG Ratio: 1.1 (calc) (ref 1.0–2.5)
ALKALINE PHOSPHATASE (APISO): 97 U/L (ref 33–130)
ALT: 11 U/L (ref 6–29)
AST: 13 U/L (ref 10–35)
Albumin: 3.5 g/dL — ABNORMAL LOW (ref 3.6–5.1)
BILIRUBIN TOTAL: 0.3 mg/dL (ref 0.2–1.2)
BUN / CREAT RATIO: 16 (calc) (ref 6–22)
BUN: 36 mg/dL — AB (ref 7–25)
CHLORIDE: 105 mmol/L (ref 98–110)
CO2: 22 mmol/L (ref 20–32)
CREATININE: 2.19 mg/dL — AB (ref 0.50–1.05)
Calcium: 9.3 mg/dL (ref 8.6–10.4)
GFR, Est African American: 28 mL/min/{1.73_m2} — ABNORMAL LOW (ref >=60)
GFR, Est Non African American: 25 mL/min/{1.73_m2} — ABNORMAL LOW (ref >=60)
GLUCOSE: 189 mg/dL — AB (ref 65–99)
Globulin: 3.3 g/dL (calc) (ref 1.9–3.7)
Potassium: 4.4 mmol/L (ref 3.5–5.3)
SODIUM: 137 mmol/L (ref 135–146)
Total Protein: 6.8 g/dL (ref 6.1–8.1)

## 2017-07-22 LAB — THYROID PANEL WITH TSH
Free Thyroxine Index: 1.7 (ref 1.4–3.8)
T3 UPTAKE: 28 % (ref 22–35)
T4 TOTAL: 6.2 ug/dL (ref 5.1–11.9)
TSH: 2.3 mIU/L

## 2017-07-22 LAB — VITAMIN D 25 HYDROXY (VIT D DEFICIENCY, FRACTURES): Vit D, 25-Hydroxy: 12 ng/mL — ABNORMAL LOW (ref 30–100)

## 2017-07-23 ENCOUNTER — Other Ambulatory Visit: Payer: Self-pay | Admitting: Family Medicine

## 2017-07-23 ENCOUNTER — Telehealth: Payer: Self-pay | Admitting: Family Medicine

## 2017-07-23 DIAGNOSIS — R809 Proteinuria, unspecified: Secondary | ICD-10-CM

## 2017-07-23 DIAGNOSIS — N184 Chronic kidney disease, stage 4 (severe): Secondary | ICD-10-CM

## 2017-07-23 NOTE — Telephone Encounter (Signed)
Referral placed to [facility]. Please have patient scheduled ASAP.

## 2017-07-26 NOTE — Telephone Encounter (Signed)
Spoke with [employer] and they state that the provider looked at the chart and code it as a level 3 which is not a stat appointment and they will get patient scheduled before the week is out.

## 2017-07-27 ENCOUNTER — Ambulatory Visit: Payer: Self-pay | Admitting: Physician Assistant
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
## 2017-08-02 ENCOUNTER — Ambulatory Visit: Payer: Self-pay | Admitting: Family Medicine

## 2017-08-02 DIAGNOSIS — Z794 Long term (current) use of insulin: Principal | ICD-10-CM

## 2017-08-02 DIAGNOSIS — E0800 Diabetes mellitus due to underlying condition with hyperosmolarity without nonketotic hyperglycemic-hyperosmolar coma (NKHHC): Secondary | ICD-10-CM

## 2017-08-02 LAB — GLUCOSE, POCT (MANUAL RESULT ENTRY)

## 2017-08-02 NOTE — Congregational Nurse Program (Signed)
Congregational Nurse Program Note  Date of Encounter: 08/02/2017  Past Medical History: Past Medical History:  Diagnosis Date  . Chronic anemia   . CKD (chronic kidney disease), stage IV ([place])   . Diabetes mellitus ([place])   . Hypertension   . Psychosis ([place])    a. prior adm in [place] ([place]) in 2015 where pt was admitted with hallucinations, seeing men in black outfits, family members on a non-existent camera, hearing voices and seeing animals - UDS positive for methamphetamines at that time and patient stated she believed someone had put this in her body.  . Seizures [facility])     Encounter Details:     CNP Questionnaire - 08/02/17 1857      Patient Demographics   Is this a new or existing patient? Existing   Patient is considered a/an Not Applicable   Race African-American/Black     Patient Assistance   Location of Patient Assistance Not Applicable   Patient's financial/insurance status Medicare   Uninsured Patient ([place] [facility]/Care Connects) No   Patient referred to apply for the following financial assistance Not Applicable   Food insecurities addressed Not Applicable   Transportation assistance No   Assistance securing medications Yes   Type of Restaurant manager, fast food health offerings Medications;Navigating the healthcare system;Chronic disease;Diabetes;Hypertension     Encounter Details   Primary purpose of visit Chronic Illness/Condition Visit;Education/Health Concerns;Spiritual Care/Support Visit;Navigating the Healthcare System   Was an Emergency Department visit averted? No   Does patient have a medical provider? Yes   Patient referred to Follow up with established PCP;Clinic   Was a mental health screening completed? (GAINS tool) No   Does patient have dental issues? No   Does patient have vision issues? Yes   Was a vision referral made? No   Does your patient have an abnormal blood pressure today? Yes   Since previous  encounter, have you referred patient for abnormal blood pressure that resulted in a new diagnosis or medication change? Yes   Does your patient have an abnormal blood glucose today? Yes   Since previous encounter, have you referred patient for abnormal blood glucose that resulted in a new diagnosis or medication change? No   Was there a life-saving intervention made? No    Unable to secure clients medications as she has no source of payment . Does not get medicare part D states she has applied , client on very expensive insulins and even if pharmacy filled vial insulin she needs help with medication co pays . Nurse will consult with others as to how to assist this client as she is a diabetic and her blood sugar today was 283 mg at 3;47 pm . Discussed situation with client as to how she was getting her medications previously , not sure . Will get back with client on tomorrow .

## 2017-08-02 NOTE — Congregational Nurse Program (Signed)
Congregational Nurse Program Note  Date of Encounter: 08/01/2017  Past Medical History: Past Medical History:  Diagnosis Date  . Chronic anemia   . CKD (chronic kidney disease), stage IV ([place])   . Diabetes mellitus ([place])   . Hypertension   . Psychosis ([place])    a. prior adm in [place] ([place]) in 2015 where pt was admitted with hallucinations, seeing men in black outfits, family members on a non-existent camera, hearing voices and seeing animals - UDS positive for methamphetamines at that time and patient stated she believed someone had put this in her body.  . Seizures ([place])     Encounter Details:     CNP Questionnaire - 08/02/17 0158      Patient Demographics   Is this a new or existing patient? Existing   Patient is considered a/an Not Applicable   Race African-American/Black     Patient Assistance   Location of Patient Assistance Not Applicable   Patient's financial/insurance status Medicare   Uninsured Patient ([place] [facility]/Care Connects) No   Patient referred to apply for the following financial assistance Not Applicable   Food insecurities addressed Not Applicable   Transportation assistance No   Assistance securing medications Yes   Type of Restaurant manager, fast food health offerings Diabetes;Medications;Hypertension     Encounter Details   Primary purpose of visit Education/Health Concerns;Navigating the Healthcare System;Chronic Illness/Condition Visit   Was an Emergency Department visit averted? Not Applicable   Does patient have a medical provider? Yes   Patient referred to Follow up with established PCP;Area Agency   Was a mental health screening completed? (GAINS tool) No   Does patient have dental issues? No   Does patient have vision issues? No   Does your patient have an abnormal blood pressure today? No   Since previous encounter, have you referred patient for abnormal blood pressure that resulted in a new diagnosis or  medication change? No   Does your patient have an abnormal blood glucose today? No   Since previous encounter, have you referred patient for abnormal blood glucose that resulted in a new diagnosis or medication change? No   Was there a life-saving intervention made? No    Client in today stating she has not picked up her medications because she didn't have her co pay. Nurse counseled regarding her diabetes and the need to have her medication. Nurse will assist with getting her medications because she needs her insulin. Referral to [facility] completed ,nurse will follow up to see if CN program can get her medications as she is on several medications. Copy of new medicare card also made. Referred client to student social worker for assessment and referral for counseling.  Follow up tomorrow on medications.

## 2017-08-03 ENCOUNTER — Observation Stay (HOSPITAL_COMMUNITY)
Admission: EM | Admit: 2017-08-03 | Discharge: 2017-08-04 | Disposition: A | Discharge status: Home / Self Care | Payer: Medicare Other | Attending: Internal Medicine | Admitting: Internal Medicine

## 2017-08-03 ENCOUNTER — Encounter (HOSPITAL_COMMUNITY): Payer: Self-pay

## 2017-08-03 DIAGNOSIS — R110 Nausea: Secondary | ICD-10-CM | POA: Diagnosis not present

## 2017-08-03 DIAGNOSIS — Z9114 Patient's other noncompliance with medication regimen: Secondary | ICD-10-CM | POA: Diagnosis not present

## 2017-08-03 DIAGNOSIS — I129 Hypertensive chronic kidney disease with stage 1 through stage 4 chronic kidney disease, or unspecified chronic kidney disease: Secondary | ICD-10-CM | POA: Diagnosis not present

## 2017-08-03 DIAGNOSIS — Z794 Long term (current) use of insulin: Secondary | ICD-10-CM | POA: Diagnosis not present

## 2017-08-03 DIAGNOSIS — Z23 Encounter for immunization: Secondary | ICD-10-CM | POA: Diagnosis not present

## 2017-08-03 DIAGNOSIS — R350 Frequency of micturition: Secondary | ICD-10-CM | POA: Diagnosis not present

## 2017-08-03 DIAGNOSIS — N189 Chronic kidney disease, unspecified: Secondary | ICD-10-CM | POA: Diagnosis not present

## 2017-08-03 DIAGNOSIS — Z79899 Other long term (current) drug therapy: Secondary | ICD-10-CM | POA: Diagnosis not present

## 2017-08-03 DIAGNOSIS — E1165 Type 2 diabetes mellitus with hyperglycemia: Principal | ICD-10-CM | POA: Insufficient documentation

## 2017-08-03 DIAGNOSIS — W06XXXA Fall from bed, initial encounter: Secondary | ICD-10-CM | POA: Diagnosis not present

## 2017-08-03 DIAGNOSIS — D631 Anemia in chronic kidney disease: Secondary | ICD-10-CM | POA: Insufficient documentation

## 2017-08-03 DIAGNOSIS — R739 Hyperglycemia, unspecified: Secondary | ICD-10-CM | POA: Diagnosis not present

## 2017-08-03 DIAGNOSIS — R569 Unspecified convulsions: Secondary | ICD-10-CM | POA: Diagnosis not present

## 2017-08-03 DIAGNOSIS — N179 Acute kidney failure, unspecified: Secondary | ICD-10-CM | POA: Diagnosis not present

## 2017-08-03 DIAGNOSIS — Z87891 Personal history of nicotine dependence: Secondary | ICD-10-CM | POA: Insufficient documentation

## 2017-08-03 DIAGNOSIS — Z91148 Patient's other noncompliance with medication regimen for other reason: Secondary | ICD-10-CM

## 2017-08-03 DIAGNOSIS — Z6841 Body Mass Index (BMI) 40.0 and over, adult: Secondary | ICD-10-CM | POA: Insufficient documentation

## 2017-08-03 DIAGNOSIS — F29 Unspecified psychosis not due to a substance or known physiological condition: Secondary | ICD-10-CM | POA: Diagnosis not present

## 2017-08-03 DIAGNOSIS — N183 Chronic kidney disease, stage 3 (moderate): Secondary | ICD-10-CM | POA: Diagnosis not present

## 2017-08-03 DIAGNOSIS — I10 Essential (primary) hypertension: Secondary | ICD-10-CM | POA: Diagnosis present

## 2017-08-03 DIAGNOSIS — E1365 Other specified diabetes mellitus with hyperglycemia: Secondary | ICD-10-CM | POA: Diagnosis not present

## 2017-08-03 LAB — BLOOD GAS, VENOUS
Acid-base deficit: 6 mmol/L — ABNORMAL HIGH (ref 0.0–2.0)
BICARBONATE: 20 mmol/L (ref 20.0–28.0)
O2 Saturation: 42.6 %
PATIENT TEMPERATURE: 98.6
PH VEN: 7.275 (ref 7.250–7.430)
pCO2, Ven: 44.7 mmHg (ref 44.0–60.0)

## 2017-08-03 LAB — URINALYSIS, ROUTINE W REFLEX MICROSCOPIC
BACTERIA UA: NONE SEEN
Bilirubin Urine: NEGATIVE
Glucose, UA: >=500 mg/dL — AB
HGB URINE DIPSTICK: NEGATIVE
Ketones, ur: NEGATIVE mg/dL
Leukocytes, UA: NEGATIVE
NITRITE: NEGATIVE
Protein, ur: >=300 mg/dL — AB
SPECIFIC GRAVITY, URINE: 1.006 (ref 1.005–1.030)
pH: 6 (ref 5.0–8.0)

## 2017-08-03 LAB — CBG MONITORING, ED
GLUCOSE-CAPILLARY: 455 mg/dL — AB (ref 65–99)
Glucose-Capillary: 559 mg/dL (ref 65–99)

## 2017-08-03 LAB — COMPREHENSIVE METABOLIC PANEL
ALK PHOS: 101 U/L (ref 38–126)
ALT: 13 U/L — AB (ref 14–54)
AST: 16 U/L (ref 15–41)
Albumin: 3 g/dL — ABNORMAL LOW (ref 3.5–5.0)
Anion gap: 11 (ref 5–15)
BILIRUBIN TOTAL: 0.5 mg/dL (ref 0.3–1.2)
BUN: 35 mg/dL — ABNORMAL HIGH (ref 6–20)
CALCIUM: 9 mg/dL (ref 8.9–10.3)
CHLORIDE: 99 mmol/L — AB (ref 101–111)
CO2: 20 mmol/L — ABNORMAL LOW (ref 22–32)
CREATININE: 2.81 mg/dL — AB (ref 0.44–1.00)
GFR, EST AFRICAN AMERICAN: 21 mL/min — AB (ref >60)
GFR, EST NON AFRICAN AMERICAN: 18 mL/min — AB (ref >60)
Glucose, Bld: 582 mg/dL (ref 65–99)
Potassium: 4.9 mmol/L (ref 3.5–5.1)
Sodium: 130 mmol/L — ABNORMAL LOW (ref 135–145)
TOTAL PROTEIN: 6.8 g/dL (ref 6.5–8.1)

## 2017-08-03 LAB — CBC WITH DIFFERENTIAL/PLATELET
BASOS ABS: 0 10*3/uL (ref 0.0–0.1)
Basophils Relative: 0 %
Eosinophils Absolute: 0.2 10*3/uL (ref 0.0–0.7)
Eosinophils Relative: 4 %
HEMATOCRIT: 30.6 % — AB (ref 36.0–46.0)
Hemoglobin: 10.1 g/dL — ABNORMAL LOW (ref 12.0–15.0)
LYMPHS PCT: 30 %
Lymphs Abs: 1.4 10*3/uL (ref 0.7–4.0)
MCH: 26.3 pg (ref 26.0–34.0)
MCHC: 33 g/dL (ref 30.0–36.0)
MCV: 79.7 fL (ref 78.0–100.0)
MONO ABS: 0.2 10*3/uL (ref 0.1–1.0)
Monocytes Relative: 5 %
Neutro Abs: 2.9 10*3/uL (ref 1.7–7.7)
Neutrophils Relative %: 61 %
Platelets: 230 10*3/uL (ref 150–400)
RBC: 3.84 MIL/uL — ABNORMAL LOW (ref 3.87–5.11)
RDW: 14.4 % (ref 11.5–15.5)
WBC: 4.8 10*3/uL (ref 4.0–10.5)

## 2017-08-03 LAB — RAPID URINE DRUG SCREEN, HOSP PERFORMED
Amphetamines: NOT DETECTED
BENZODIAZEPINES: NOT DETECTED
Barbiturates: NOT DETECTED
Cocaine: NOT DETECTED
Opiates: NOT DETECTED
Tetrahydrocannabinol: NOT DETECTED

## 2017-08-03 LAB — PHOSPHORUS: PHOSPHORUS: 3.7 mg/dL (ref 2.5–4.6)

## 2017-08-03 LAB — TROPONIN I

## 2017-08-03 LAB — LIPASE, BLOOD: LIPASE: 32 U/L (ref 11–51)

## 2017-08-03 LAB — GLUCOSE, CAPILLARY
GLUCOSE-CAPILLARY: 363 mg/dL — AB (ref 65–99)
GLUCOSE-CAPILLARY: 90 mg/dL (ref 65–99)
GLUCOSE-CAPILLARY: 319 mg/dL — AB (ref 65–99)

## 2017-08-03 LAB — MAGNESIUM: MAGNESIUM: 1.9 mg/dL (ref 1.7–2.4)

## 2017-08-03 MED ORDER — ACETAMINOPHEN 650 MG RE SUPP: 650.0000 mg | Freq: Four times a day (QID) | RECTAL | Status: DC | PRN

## 2017-08-03 MED ORDER — SODIUM CHLORIDE 0.9 % IV SOLN
INTRAVENOUS | Status: DC
  Administered 2017-08-03: 13:00:00 via INTRAVENOUS

## 2017-08-03 MED ORDER — KETOROLAC TROMETHAMINE 30 MG/ML IJ SOLN
30.0000 mg | Freq: Once | INTRAMUSCULAR | Status: AC
  Administered 2017-08-03: 30 mg via INTRAVENOUS
  Filled 2017-08-03: qty 1

## 2017-08-03 MED ORDER — BENZTROPINE MESYLATE 0.5 MG PO TABS
2.0000 mg | ORAL_TABLET | Freq: Two times a day (BID) | ORAL | Status: DC
  Administered 2017-08-03 – 2017-08-04 (×3): 2 mg via ORAL
  Filled 2017-08-03 (×3): qty 4

## 2017-08-03 MED ORDER — DOCUSATE SODIUM 100 MG PO CAPS
200.0000 mg | ORAL_CAPSULE | Freq: Every day | ORAL | Status: DC
  Administered 2017-08-03 – 2017-08-04 (×2): 200 mg via ORAL
  Filled 2017-08-03 (×2): qty 2

## 2017-08-03 MED ORDER — LEVETIRACETAM 500 MG PO TABS
1000.0000 mg | ORAL_TABLET | Freq: Two times a day (BID) | ORAL | Status: DC
  Administered 2017-08-03 – 2017-08-04 (×3): 1000 mg via ORAL
  Filled 2017-08-03 (×3): qty 2

## 2017-08-03 MED ORDER — SODIUM CHLORIDE 0.9 % IV BOLUS (SEPSIS)
500.0000 mL | Freq: Once | INTRAVENOUS | Status: AC
  Administered 2017-08-03: 500 mL via INTRAVENOUS

## 2017-08-03 MED ORDER — INSULIN ASPART 100 UNIT/ML ~~LOC~~ SOLN
10.0000 [IU] | Freq: Once | SUBCUTANEOUS | Status: AC
  Administered 2017-08-03: 10 [IU] via INTRAVENOUS
  Filled 2017-08-03: qty 1

## 2017-08-03 MED ORDER — FERROUS FUMARATE 324 (106 FE) MG PO TABS
1.0000 | ORAL_TABLET | Freq: Every day | ORAL | Status: DC
  Administered 2017-08-04: 106 mg via ORAL
  Filled 2017-08-03: qty 1

## 2017-08-03 MED ORDER — PNEUMOCOCCAL VAC POLYVALENT 25 MCG/0.5ML IJ INJ: 0.5000 mL | INJECTION | INTRAMUSCULAR | Status: DC

## 2017-08-03 MED ORDER — ACETAMINOPHEN 325 MG PO TABS: 650.0000 mg | ORAL_TABLET | Freq: Four times a day (QID) | ORAL | Status: DC | PRN

## 2017-08-03 MED ORDER — INSULIN GLARGINE 100 UNIT/ML ~~LOC~~ SOLN
60.0000 [IU] | Freq: Every evening | SUBCUTANEOUS | Status: DC
  Administered 2017-08-03: 60 [IU] via SUBCUTANEOUS
  Filled 2017-08-03 (×2): qty 0.6

## 2017-08-03 MED ORDER — INSULIN ASPART 100 UNIT/ML ~~LOC~~ SOLN
0.0000 [IU] | Freq: Three times a day (TID) | SUBCUTANEOUS | Status: DC
  Administered 2017-08-03: 20 [IU] via SUBCUTANEOUS
  Administered 2017-08-04: 15 [IU] via SUBCUTANEOUS
  Administered 2017-08-04: 7 [IU] via SUBCUTANEOUS

## 2017-08-03 MED ORDER — LORAZEPAM 2 MG/ML IJ SOLN: 1.0000 mg | INTRAMUSCULAR | Status: DC | PRN

## 2017-08-03 MED ORDER — ONDANSETRON HCL 4 MG/2ML IJ SOLN: 4.0000 mg | Freq: Four times a day (QID) | INTRAMUSCULAR | Status: DC | PRN

## 2017-08-03 MED ORDER — PNEUMOCOCCAL VAC POLYVALENT 25 MCG/0.5ML IJ INJ
0.5000 mL | INJECTION | INTRAMUSCULAR | Status: AC
  Administered 2017-08-04: 0.5 mL via INTRAMUSCULAR
  Filled 2017-08-03: qty 0.5

## 2017-08-03 MED ORDER — SODIUM CHLORIDE 0.9 % IV BOLUS (SEPSIS)
1000.0000 mL | Freq: Once | INTRAVENOUS | Status: AC
  Administered 2017-08-03: 1000 mL via INTRAVENOUS

## 2017-08-03 MED ORDER — HYDRALAZINE HCL 20 MG/ML IJ SOLN: 10.0000 mg | Freq: Three times a day (TID) | INTRAMUSCULAR | Status: DC | PRN

## 2017-08-03 MED ORDER — CETAPHIL MOISTURIZING EX LOTN
TOPICAL_LOTION | Freq: Every day | CUTANEOUS | Status: DC
  Administered 2017-08-04: 08:00:00 via TOPICAL
  Filled 2017-08-03: qty 473

## 2017-08-03 MED ORDER — ENOXAPARIN SODIUM 30 MG/0.3ML ~~LOC~~ SOLN
30.0000 mg | SUBCUTANEOUS | Status: DC
  Administered 2017-08-03: 30 mg via SUBCUTANEOUS
  Filled 2017-08-03: qty 0.3

## 2017-08-03 MED ORDER — ONDANSETRON HCL 4 MG PO TABS: 4.0000 mg | ORAL_TABLET | Freq: Four times a day (QID) | ORAL | Status: DC | PRN

## 2017-08-03 MED ORDER — ONDANSETRON HCL 4 MG/2ML IJ SOLN
4.0000 mg | Freq: Once | INTRAMUSCULAR | Status: AC
  Administered 2017-08-03: 4 mg via INTRAVENOUS
  Filled 2017-08-03: qty 2

## 2017-08-03 MED ORDER — HYDRALAZINE HCL 25 MG PO TABS
25.0000 mg | ORAL_TABLET | Freq: Three times a day (TID) | ORAL | Status: DC
  Administered 2017-08-03 – 2017-08-04 (×4): 25 mg via ORAL
  Filled 2017-08-03 (×4): qty 1
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
## 2017-08-03 NOTE — ED Notes (Signed)
Amber Lara from the congregational nurse program called regarding pts outpatient medications. Please call her at [phone] upon discharge.

## 2017-08-03 NOTE — Progress Notes (Signed)
Patient wants new PIV in Right arm. RN tried to start PIV in Right arm but was unsuccessful. IV team was paged.

## 2017-08-03 NOTE — ED Notes (Signed)
Bed: TJ03 Expected date:  Expected time:  Means of arrival:  Comments: 56 yo f hyperglycemia

## 2017-08-03 NOTE — ED Triage Notes (Signed)
Per EMS, patient c/o hyperglycemia and right leg pain. She ran out of her 2 insulins 5 days ago. On scene blood sugar read 499 which was as high as their meter reads. This has happen before to patient. R leg pain is due to sciatica, pt states that they wont rx pain medication. Also c/o difficulty seeing out of R eye. A/O, walks.

## 2017-08-03 NOTE — ED Notes (Signed)
Call report to [place], [place] at [phone] at [phone].
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
## 2017-08-03 NOTE — Progress Notes (Signed)
IV team came to start a PIV in right arm but the patient refused.   Now patient has a headache rated 9/10. PCP on call was notified

## 2017-08-03 NOTE — ED Notes (Signed)
Respiratory contacted regarding Blood gas
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
## 2017-08-03 NOTE — ED Notes (Signed)
CBG 559  

## 2017-08-04 DIAGNOSIS — R739 Hyperglycemia, unspecified: Secondary | ICD-10-CM | POA: Diagnosis not present

## 2017-08-04 LAB — BASIC METABOLIC PANEL
ANION GAP: 9 (ref 5–15)
BUN: 28 mg/dL — ABNORMAL HIGH (ref 6–20)
CALCIUM: 8.7 mg/dL — AB (ref 8.9–10.3)
CHLORIDE: 100 mmol/L — AB (ref 101–111)
CO2: 23 mmol/L (ref 22–32)
Creatinine, Ser: 2.4 mg/dL — ABNORMAL HIGH (ref 0.44–1.00)
GFR calc Af Amer: 25 mL/min — ABNORMAL LOW (ref >60)
GFR calc non Af Amer: 22 mL/min — ABNORMAL LOW (ref >60)
GLUCOSE: 331 mg/dL — AB (ref 65–99)
Potassium: 4.7 mmol/L (ref 3.5–5.1)
Sodium: 132 mmol/L — ABNORMAL LOW (ref 135–145)

## 2017-08-04 LAB — CBC
HEMATOCRIT: 29.1 % — AB (ref 36.0–46.0)
HEMOGLOBIN: 9.6 g/dL — AB (ref 12.0–15.0)
MCH: 26.1 pg (ref 26.0–34.0)
MCHC: 33 g/dL (ref 30.0–36.0)
MCV: 79.1 fL (ref 78.0–100.0)
Platelets: 193 10*3/uL (ref 150–400)
RBC: 3.68 MIL/uL — ABNORMAL LOW (ref 3.87–5.11)
RDW: 14.3 % (ref 11.5–15.5)
WBC: 5 10*3/uL (ref 4.0–10.5)

## 2017-08-04 LAB — GLUCOSE, CAPILLARY
Glucose-Capillary: 233 mg/dL — ABNORMAL HIGH (ref 65–99)
Glucose-Capillary: 321 mg/dL — ABNORMAL HIGH (ref 65–99)

## 2017-08-04 MED ORDER — INSULIN GLARGINE 100 UNIT/ML ~~LOC~~ SOLN: 70.0000 [IU] | Freq: Every day | SUBCUTANEOUS | 0 refills | Status: DC

## 2017-08-04 MED ORDER — INSULIN ASPART 100 UNIT/ML ~~LOC~~ SOLN: SUBCUTANEOUS | 0 refills | Status: DC

## 2017-08-04 MED ORDER — LANTUS SOLOSTAR 100 UNIT/ML ~~LOC~~ SOPN: 70.0000 [IU] | PEN_INJECTOR | Freq: Every evening | SUBCUTANEOUS | 3 refills | Status: DC

## 2017-08-04 MED ORDER — NOVOLOG FLEXPEN 100 UNIT/ML ~~LOC~~ SOPN: 0.0000 [IU] | PEN_INJECTOR | Freq: Three times a day (TID) | SUBCUTANEOUS | 3 refills | Status: DC

## 2017-08-04 MED FILL — NovoLOG 100 UNIT/ML SOLN: 100 | 28 days supply | Qty: 10 | Fill #0

## 2017-08-04 MED FILL — LANTUS 100 UNITS/ML VIAL: 100 | 14 days supply | Qty: 10 | Fill #0

## 2017-08-04 NOTE — Progress Notes (Signed)
Patient given discharge, follow up, and medication instructions, verbalized understanding, patient removed her own IV, no bleeding noted at this time, telemetry removed, friend to transport home

## 2017-08-04 NOTE — Progress Notes (Signed)
Explained [place] program to pt. An Employee volunteered to pay co pay for medication.

## 2017-08-04 NOTE — Discharge Instructions (Signed)

## 2017-08-04 NOTE — Progress Notes (Signed)
Patient wants a shower. RN notified the PCP on call.

## 2017-08-04 NOTE — Progress Notes (Signed)
Patient refused all Lab draws this morning.  PCP was notified

## 2017-08-04 NOTE — Progress Notes (Signed)
Encouraged pt to follow with her appointment Monday at [facility]. Pt states that she missed one appointment because she was here in the hospital.  Explained to pt discharge plan and her multiple admissions to the hospital. Encouraged pt again that she needs a PCP and part D insurance. Pt asked for a letter, stating that she can have water at the bedside.Asked MD for note.

## 2017-08-04 NOTE — Discharge Summary (Addendum)
Physician Discharge Summary  Amber Lara OZH:[id number] DOB: 09/20/1961 DOA: 08/03/2017  PCP: Scot Jun, FNP  Admit date: 08/03/2017 Discharge date: 08/04/2017  Recommendations for Outpatient Follow-up:  1. Pt will need to follow up with PCP in 1-2 weeks post discharge 2. Please obtain BMP to evaluate electrolytes and kidney function 3. Please also check CBC to evaluate Hg and Hct levels 4. Please also assess CBG's and If pt compliant with insulin regimen   Discharge Diagnoses:  Principal Problem:   Hyperglycemia Active Problems:   Essential hypertension   Acute on chronic renal failure (HCC)   Seizure (HCC)  Discharge Condition: Stable  Diet recommendation: Heart healthy diet discussed in details   History of present illness:  Pt is 56 yo female with known CKD, HTN, DM, presented to ED with high sugar levels saying she ran out of insulin.   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  - in the setting of pt not taking insulin, saying she ran out - CM consulted for assistance with medications, resources provided and scripts sent to [facility]  - education on compliance provided   Active Problems:   Essential hypertension - resume home medical regimen     Acute on chronic renal failure ([place]), CKD stage III - Cr has improved overnight - pt tolerating diet - outpatient follow up     Seizure ([place]) - stable - continue home medical regimen     Anemia of chronic disease, CKD and DM - no evidence of active bleeding     Morbid obesity - Body mass index is 40.74 kg/m.   Procedures/Studies: Dg Chest 2 View  Result Date: 07/14/2017 CLINICAL DATA:  Bilateral lower extremity swelling and high blood sugar today. History of hypertension and diabetes. Nonsmoker. EXAM: CHEST  2 VIEW COMPARISON:  None. FINDINGS: The heart size and mediastinal contours are within normal limits. Both lungs are clear. The visualized  skeletal structures are unremarkable. IMPRESSION: No active cardiopulmonary disease. Electronically Signed   By: Lucienne Capers M.D.   On: 07/14/2017 03:35   Ct Head Wo Contrast  Result Date: 07/11/2017 CLINICAL DATA:  Patient fell out of bed this morning. Complains of neck pain and headache. No loss of consciousness. History of hypertension, diabetes, seizures. EXAM: CT HEAD WITHOUT CONTRAST CT CERVICAL SPINE WITHOUT CONTRAST TECHNIQUE: Multidetector CT imaging of the head and cervical spine was performed following the standard protocol without intravenous contrast. Multiplanar CT image reconstructions of the cervical spine were also generated. COMPARISON:  None. FINDINGS: CT HEAD FINDINGS Brain: Mild cerebral atrophy. No ventricular dilatation. Focal low-attenuation areas demonstrated in the left anterior frontal lobe, the left temporal lobe, and the left posterior parietal and occipital lobe. These changes likely represent old or subacute infarcts. There is no mass effect or midline shift. No abnormal extra-axial fluid collections. Gray-white matter junctions are distinct. Basal cisterns are not effaced. No acute intracranial hemorrhage. Vascular: No hyperdense vessel or unexpected calcification. Skull: Calvarium appears intact. Sinuses/Orbits: Paranasal sinuses and mastoid air cells are not opacified. Other: Surgical clips versus calcifications in the soft tissues of the scalp over the bilateral temporal regions. CT CERVICAL SPINE FINDINGS Alignment: Straightening of usual cervical lordosis without anterior subluxation. This may be due to patient positioning but ligamentous injury or muscle spasm could also have this appearance and are not excluded. Normal alignment of the posterior facet joints. C1-2 articulation appears intact. Old appearing ununited ossicle adjacent to the right lateral  mass of CT. Skull base and vertebrae: No vertebral compression deformities. No focal bone lesion or bone destruction.  Bone cortex appears intact. Soft tissues and spinal canal: No prevertebral soft tissue swelling. No paraspinal soft tissue infiltration. No soft tissue mass lesions identified. Disc levels: Mild degenerative endplate hypertrophic changes at C5-6 and C6-7 levels. Degenerative changes at C1 to. Upper chest: There is a 2.1 cm diameter right paratracheal lesion which is only seen on the inferior most slices of the study. This could represent the azygos vein but right peritracheal lymphadenopathy is not excluded. Consider follow-up with chest CT. Other: None. IMPRESSION: 1. No acute intracranial abnormalities. 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 Mild cerebral atrophy. 2. Nonspecific straightening of usual cervical lordosis. No acute displaced fractures identified in the cervical spine. 3. Indeterminate right paratracheal lesion incompletely imaged on this study. This could represent the azygos vein or right peritracheal lymphadenopathy. Consider follow-up with chest CT. Electronically Signed   By: Lucienne Capers M.D.   On: 07/11/2017 00:33   Ct Cervical Spine Wo Contrast  Result Date: 07/11/2017 CLINICAL DATA:  Patient fell out of bed this morning. Complains of neck pain and headache. No loss of consciousness. History of hypertension, diabetes, seizures. EXAM: CT HEAD WITHOUT CONTRAST CT CERVICAL SPINE WITHOUT CONTRAST TECHNIQUE: Multidetector CT imaging of the head and cervical spine was performed following the standard protocol without intravenous contrast. Multiplanar CT image reconstructions of the cervical spine were also generated. COMPARISON:  None. FINDINGS: CT HEAD FINDINGS Brain: Mild cerebral atrophy. No ventricular dilatation. Focal low-attenuation areas demonstrated in the left anterior frontal lobe, the left temporal lobe, and the left posterior parietal and occipital lobe. These changes likely represent old or subacute infarcts. There is no mass effect or midline shift. No  abnormal extra-axial fluid collections. Gray-white matter junctions are distinct. Basal cisterns are not effaced. No acute intracranial hemorrhage. Vascular: No hyperdense vessel or unexpected calcification. Skull: Calvarium appears intact. Sinuses/Orbits: Paranasal sinuses and mastoid air cells are not opacified. Other: Surgical clips versus calcifications in the soft tissues of the scalp over the bilateral temporal regions. CT CERVICAL SPINE FINDINGS Alignment: Straightening of usual cervical lordosis without anterior subluxation. This may be due to patient positioning but ligamentous injury or muscle spasm could also have this appearance and are not excluded. Normal alignment of the posterior facet joints. C1-2 articulation appears intact. Old appearing ununited ossicle adjacent to the right lateral mass of CT. Skull base and vertebrae: No vertebral compression deformities. No focal bone lesion or bone destruction. Bone cortex appears intact. Soft tissues and spinal canal: No prevertebral soft tissue swelling. No paraspinal soft tissue infiltration. No soft tissue mass lesions identified. Disc levels: Mild degenerative endplate hypertrophic changes at C5-6 and C6-7 levels. Degenerative changes at C1 to. Upper chest: There is a 2.1 cm diameter right paratracheal lesion which is only seen on the inferior most slices of the study. This could represent the azygos vein but right peritracheal lymphadenopathy is not excluded. Consider follow-up with chest CT. Other: None. IMPRESSION: 1. No acute intracranial abnormalities. 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 Mild cerebral atrophy. 2. Nonspecific straightening of usual cervical lordosis. No acute displaced fractures identified in the cervical spine. 3. Indeterminate right paratracheal lesion incompletely imaged on this study. This could represent the azygos vein or right peritracheal lymphadenopathy. Consider follow-up with chest CT.  Electronically Signed   By: Lucienne Capers M.D.   On: 07/11/2017 00:33   US Renal  Result Date:  07/14/2017 CLINICAL DATA:  56 year old female acute renal failure. EXAM: RENAL / URINARY TRACT ULTRASOUND COMPLETE COMPARISON:  None. FINDINGS: Right Kidney: Length: 11.9 cm. Echogenicity within normal limits. No mass or hydronephrosis visualized. Left Kidney: Length: 12.1 cm. Echogenicity within normal limits. No mass or hydronephrosis visualized. Bladder: Appears normal for degree of bladder distention. IMPRESSION: Normal ultrasound appearance of both kidneys and the urinary bladder. Electronically Signed   By: Genevie Ann M.D.   On: 07/14/2017 09:16   Dg Hip Unilat W Or Wo Pelvis 2-3 Views Right  Result Date: 07/11/2017 CLINICAL DATA:  Patient fell this morning landing on the right hip. Lateral hip pain with limited range of motion. EXAM: DG HIP (WITH OR WITHOUT PELVIS) 2-3V RIGHT COMPARISON:  None. FINDINGS: Visualization of the right hip is limited due to patient's body habitus. There is no evidence of hip fracture or dislocation. There is no evidence of arthropathy or other focal bone abnormality. IMPRESSION: Negative. Electronically Signed   By: Lucienne Capers M.D.   On: 07/11/2017 00:15     Discharge Exam: Vitals:   08/03/17 2213 08/04/17 0452  BP: (!) 168/72 (!) 158/64  Pulse: 66 60  Resp: 18 18  Temp: 98.6 F (37 C) 98 F (36.7 C)  SpO2: 97% 100%   Vitals:   08/03/17 1111 08/03/17 1537 08/03/17 2213 08/04/17 0452  BP: (!) 123/95 134/71 (!) 168/72 (!) 158/64  Pulse: 71 75 66 60  Resp: 18  18 18   Temp: (!) 97.5 F (36.4 C)  98.6 F (37 C) 98 F (36.7 C)  TempSrc: Oral  Oral Oral  SpO2: 100%  97% 100%  Weight: 106 kg (233 lb 11 oz)   104.3 kg (230 lb)  Height: 5\' 3"  (1.6 m)       General: Pt is alert, follows commands appropriately, not in acute distress Cardiovascular: Regular rate and rhythm, S1/S2 +, no murmurs, no rubs, no gallops Respiratory: Clear to auscultation  bilaterally, no wheezing, no crackles, no rhonchi Abdominal: Soft, non tender, non distended, bowel sounds +, no guarding  Discharge Instructions  Discharge Instructions    Diet - low sodium heart healthy    Complete by:  As directed    Increase activity slowly    Complete by:  As directed      Allergies as of 08/04/2017      Reactions   Penicillins Hives   Has patient had a PCN reaction causing immediate rash, facial/tongue/throat swelling, SOB or lightheadedness with hypotension: yes Has patient had a PCN reaction causing severe rash involving mucus membranes or skin n24} If all of the above answers are "NO", then may proceed with Cephalosporin use.ecrosis: no Has patient had a PCN reaction that required hospitalization: no Has patient had a PCN reaction occurring within the last 10 years:no   Tetracyclines & Related Anaphylaxis, Hives   Morphine And Related Hives      Medication List    STOP taking these medications   LANTUS SOLOSTAR 100 UNIT/ML Solostar Pen Generic drug:  Insulin Glargine Replaced by:  insulin glargine 100 UNIT/ML injection   NOVOLOG FLEXPEN 100 UNIT/ML FlexPen Generic drug:  insulin aspart Replaced by:  insulin aspart 100 UNIT/ML injection     TAKE these medications   acetaminophen 325 MG tablet Commonly known as:  TYLENOL Take 2 tablets (650 mg total) by mouth every 6 (six) hours as needed for mild pain or moderate pain (or Fever >/= 101).   benztropine 2 MG tablet Commonly known as:  COGENTIN Take 2 mg by mouth 2 (two) times daily.   diclofenac sodium 1 % Gel Commonly known as:  VOLTAREN Apply 2 g topically 4 (four) times daily. Apply to front of your mid chest. Use regularly for 3-5 days then use as needed.   docusate sodium 100 MG capsule Commonly known as:  COLACE Take 200 mg by mouth daily.   Ferrous Fumarate 324 (106 Fe) MG Tabs tablet Commonly known as:  HEMOCYTE - 106 mg FE Take 1 tablet by mouth daily.   furosemide 20 MG  tablet Commonly known as:  LASIX Take 1 tablet (20 mg total) by mouth daily.   GIN-ZING PO Take 250 mg by mouth daily.   hydrALAZINE 25 MG tablet Commonly known as:  APRESOLINE Take 1 tablet (25 mg total) by mouth 3 (three) times daily.   insulin aspart 100 UNIT/ML injection Commonly known as:  NOVOLOG INJECT 0-12 U PER SLIDING SCALE INSTRUCTIONS. Replaces:  NOVOLOG FLEXPEN 100 UNIT/ML FlexPen   insulin glargine 100 UNIT/ML injection Commonly known as:  LANTUS Inject 0.7 mLs (70 Units total) into the skin at bedtime. Replaces:  LANTUS SOLOSTAR 100 UNIT/ML Solostar Pen   Insulin Pen Needle 30G X 8 MM Misc Commonly known as:  NOVOFINE Inject insulin into skin per units prescribed.   Insulin Syringe-Needle U-100 31G X 5/16" 0.3 ML Misc Commonly known as:  TRUEPLUS INSULIN SYRINGE Use as directed   levETIRAcetam 1000 MG tablet Commonly known as:  KEPPRA Take 1 tablet (1,000 mg total) by mouth 2 (two) times daily.   multivitamin with minerals Tabs tablet Take 1 tablet by mouth daily.        Follow-up Information    Scot Jun, FNP Follow up.   Specialty:  Family Medicine Contact information: [place] [address] [phone]            The results of significant diagnostics from this hospitalization (including imaging, microbiology, ancillary and laboratory) are listed below for reference.     Microbiology: No results found for this or any previous visit (from the past 240 hour(s)).   Labs: Basic Metabolic Panel:  Recent Labs Lab 08/03/17 0845 08/04/17 0928  NA 130* 132*  K 4.9 4.7  CL 99* 100*  CO2 20* 23  GLUCOSE 582* 331*  BUN 35* 28*  CREATININE 2.81* 2.40*  CALCIUM 9.0 8.7*  MG 1.9  --   PHOS 3.7  --    Liver Function Tests:  Recent Labs Lab 08/03/17 0845  AST 16  ALT 13*  ALKPHOS 101  BILITOT 0.5  PROT 6.8  ALBUMIN 3.0*    Recent Labs Lab 08/03/17 0845  LIPASE 32   CBC:  Recent Labs Lab  08/03/17 0845 08/04/17 0928  WBC 4.8 5.0  NEUTROABS 2.9  --   HGB 10.1* 9.6*  HCT 30.6* 29.1*  MCV 79.7 79.1  PLT 230 193   Cardiac Enzymes:  Recent Labs Lab 08/03/17 0845  TROPONINI <0.03   BNP (last 3 results)  Recent Labs  07/14/17 0312  BNP 5.9   CBG:  Recent Labs Lab 08/03/17 0820 08/03/17 0957 08/03/17 1131 08/03/17 1658 08/03/17 2057  GLUCAP 559* 455* 363* 90 319*     SIGNED: Time coordinating discharge: 45 minutes  Faye Ramsay, MD  [facility] 08/04/2017, 10:33 AM Pager [phone]  If 7PM-7AM, please contact night-coverage [email or web address] Password [email or web address]

## 2017-08-07 ENCOUNTER — Ambulatory Visit (INDEPENDENT_AMBULATORY_CARE_PROVIDER_SITE_OTHER): Payer: Medicare Other | Admitting: Family Medicine

## 2017-08-07 ENCOUNTER — Encounter: Payer: Self-pay | Admitting: Family Medicine

## 2017-08-07 VITALS — BP 142/76 | HR 90 | Temp 98.1°F | Resp 16 | Ht 63.0 in | Wt 236.0 lb

## 2017-08-07 DIAGNOSIS — E1122 Type 2 diabetes mellitus with diabetic chronic kidney disease: Secondary | ICD-10-CM | POA: Diagnosis not present

## 2017-08-07 DIAGNOSIS — Z794 Long term (current) use of insulin: Secondary | ICD-10-CM

## 2017-08-07 DIAGNOSIS — I10 Essential (primary) hypertension: Secondary | ICD-10-CM | POA: Diagnosis not present

## 2017-08-07 DIAGNOSIS — N184 Chronic kidney disease, stage 4 (severe): Secondary | ICD-10-CM

## 2017-08-07 LAB — COMPLETE METABOLIC PANEL WITH GFR
AG Ratio: 1.1 (calc) (ref 1.0–2.5)
ALT: 14 U/L (ref 6–29)
AST: 17 U/L (ref 10–35)
Albumin: 3.6 g/dL (ref 3.6–5.1)
Alkaline phosphatase (APISO): 101 U/L (ref 33–130)
BUN/Creatinine Ratio: 11 (calc) (ref 6–22)
BUN: 29 mg/dL — AB (ref 7–25)
CALCIUM: 9.2 mg/dL (ref 8.6–10.4)
CHLORIDE: 105 mmol/L (ref 98–110)
CO2: 22 mmol/L (ref 20–32)
CREATININE: 2.63 mg/dL — AB (ref 0.50–1.05)
GFR, EST AFRICAN AMERICAN: 23 mL/min/{1.73_m2} — AB (ref >=60)
GFR, EST NON AFRICAN AMERICAN: 20 mL/min/{1.73_m2} — AB (ref >=60)
GLUCOSE: 122 mg/dL — AB (ref 65–99)
Globulin: 3.3 g/dL (calc) (ref 1.9–3.7)
Potassium: 4.9 mmol/L (ref 3.5–5.3)
Sodium: 136 mmol/L (ref 135–146)
TOTAL PROTEIN: 6.9 g/dL (ref 6.1–8.1)
Total Bilirubin: 0.4 mg/dL (ref 0.2–1.2)

## 2017-08-07 LAB — POCT URINALYSIS DIP (DEVICE)
BILIRUBIN URINE: NEGATIVE
Glucose, UA: 500 mg/dL — AB
KETONES UR: NEGATIVE mg/dL
Leukocytes, UA: NEGATIVE
Nitrite: NEGATIVE
PH: 6 (ref 5.0–8.0)
Protein, ur: >=300 mg/dL — AB
SPECIFIC GRAVITY, URINE: 1.02 (ref 1.005–1.030)
Urobilinogen, UA: 0.2 mg/dL (ref 0.0–1.0)

## 2017-08-07 LAB — CBC WITH DIFFERENTIAL/PLATELET
BASOS PCT: 0.3 %
Basophils Absolute: 17 cells/uL (ref 0–200)
Eosinophils Absolute: 244 cells/uL (ref 15–500)
Eosinophils Relative: 4.2 %
HCT: 32.7 % — ABNORMAL LOW (ref 35.0–45.0)
HEMOGLOBIN: 10.7 g/dL — AB (ref 11.7–15.5)
Lymphs Abs: 2169 cells/uL (ref 850–3900)
MCH: 25.7 pg — ABNORMAL LOW (ref 27.0–33.0)
MCHC: 32.7 g/dL (ref 32.0–36.0)
MCV: 78.6 fL — ABNORMAL LOW (ref 80.0–100.0)
MONOS PCT: 7.8 %
MPV: 10.9 fL (ref 7.5–12.5)
NEUTROS ABS: 2917 {cells}/uL (ref 1500–7800)
Neutrophils Relative %: 50.3 %
PLATELETS: 259 10*3/uL (ref 140–400)
RBC: 4.16 10*6/uL (ref 3.80–5.10)
RDW: 14.9 % (ref 11.0–15.0)
TOTAL LYMPHOCYTE: 37.4 %
WBC mixed population: 452 cells/uL (ref 200–950)
WBC: 5.8 10*3/uL (ref 3.8–10.8)

## 2017-08-07 LAB — GLUCOSE, CAPILLARY: Glucose-Capillary: 193 mg/dL — ABNORMAL HIGH (ref 65–99)

## 2017-08-07 MED ORDER — INSULIN ASPART 100 UNIT/ML ~~LOC~~ SOLN: SUBCUTANEOUS | 0 refills | Status: DC

## 2017-08-07 MED ORDER — GABAPENTIN 300 MG PO CAPS: 300.0000 mg | ORAL_CAPSULE | Freq: Three times a day (TID) | ORAL | 3 refills | Status: DC

## 2017-08-07 MED ORDER — FERROUS FUMARATE 324 (106 FE) MG PO TABS: 1.0000 | ORAL_TABLET | Freq: Every day | ORAL | 3 refills | Status: DC

## 2017-08-07 NOTE — Progress Notes (Signed)
Patient ID: Amber Lara, female    DOB: 1961-07-16, 56 y.o.   MRN: [id number]  PCP: Scot Jun, FNP  Chief Complaint  Patient presents with  . Hospitalization Follow-up    Subjective:  HPI Amber Lara is a 56 y.o. female presents for hospital follow-up.  Patient was recently hospitalized at [facility] on 08/03/2017 due to hyperglycemia.  She advised that she was unable to obtain her insulin and had been without medication for several days.  She reports that she was experiencing urinary frequency and fatigue. Her blood sugars were stabilized and she was discharged.  the following day.  R she reports that since resuming insulin she feels much better. She reports some neuropathy in her fingers and toes. She does not have a glucometer and has been unable to check her blood sugars. She has been dosing her insulin based upon how she feels as she was discharged on a sliding scale. She has been unable to obtain a diabetes meter due to cost and she has no prescription drug coverage. [name] was referred to [facility] during her last office visit and reports that she has not been contacted to schedule a visit. Last BUN 29, creatinine 2.63, GFR 23. She has not had any prior evaluation by nephrology. Social History   Social History  . Marital status: Widowed    Spouse name: N/A  . Number of children: N/A  . Years of education: N/A   Occupational History  . Not on file.   Social History Main Topics  . Smoking status: Former Research scientist (life sciences)  . Smokeless tobacco: Never Used     Comment: Smoked age [age]  . Alcohol use No  . Drug use: No  . Sexual activity: Not on file   Other Topics Concern  . Not on file   Social History Narrative  . No narrative on file    Family History  Problem Relation Age of Onset  . Diabetes Mellitus II Mother   . CAD Mother        First MI in her 55s, passed away before age 60  . Diabetes Mellitus II Sister   . CAD Sister        Stents in her  early 46s  . Diabetes Mellitus II Brother    Review of Systems See HPI  Patient Active Problem List   Diagnosis Date Noted  . Acute on chronic renal failure ([place]) 08/03/2017  . Hyperglycemia 08/03/2017  . Seizure ([place]) 08/03/2017  . Chest pain 07/14/2017  . Hyperkalemia 07/14/2017  . Acute kidney injury superimposed on chronic kidney disease ([place]) 07/14/2017  . Type 2 diabetes mellitus with hyperglycemia ([place]) 06/23/2017  . Normochromic normocytic anemia 06/23/2017  . Essential hypertension 06/23/2017  . Acute kidney injury superimposed on CKD ([employer]) 06/23/2017    Allergies  Allergen Reactions  . Penicillins Hives    Has patient had a PCN reaction causing immediate rash, facial/tongue/throat swelling, SOB or lightheadedness with hypotension: yes Has patient had a PCN reaction causing severe rash involving mucus membranes or skin n24} If all of the above answers are "NO", then may proceed with Cephalosporin use.ecrosis: no Has patient had a PCN reaction that required hospitalization: no Has patient had a PCN reaction occurring within the last 10 years:no  . Tetracyclines & Related Anaphylaxis and Hives  . Morphine And Related Hives    Prior to Admission medications   Medication Sig Start Date End Date Taking? Authorizing Provider  acetaminophen (TYLENOL) 325 MG tablet  Take 2 tablets (650 mg total) by mouth every 6 (six) hours as needed for mild pain or moderate pain (or Fever >/= 101). 07/15/17  Yes Hongalgi, Lenis Dickinson, MD  diclofenac sodium (VOLTAREN) 1 % GEL Apply 2 g topically 4 (four) times daily. Apply to front of your mid chest. Use regularly for 3-5 days then use as needed. 07/15/17  Yes Hongalgi, Lenis Dickinson, MD  docusate sodium (COLACE) 100 MG capsule Take 200 mg by mouth daily.   Yes [provider]  hydrALAZINE (APRESOLINE) 25 MG tablet Take 1 tablet (25 mg total) by mouth 3 (three) times daily. 07/19/17  Yes Scot Jun, FNP  levETIRAcetam (KEPPRA) 1000 MG  tablet Take 1 tablet (1,000 mg total) by mouth 2 (two) times daily. 07/19/17  Yes Scot Jun, FNP  benztropine (COGENTIN) 2 MG tablet Take 2 mg by mouth 2 (two) times daily. 03/24/17   [provider]  Ferrous Fumarate (HEMOCYTE - 106 MG FE) 324 (106 Fe) MG TABS tablet Take 1 tablet by mouth daily. 06/08/17   [provider]  furosemide (LASIX) 20 MG tablet Take 1 tablet (20 mg total) by mouth daily. Patient not taking: Reported on 08/07/2017 07/15/17   Modena Jansky, MD  Ginseng (GIN-ZING PO) Take 250 mg by mouth daily.    [provider]  insulin aspart (NOVOLOG) 100 UNIT/ML injection INJECT 0-12 U PER SLIDING SCALE INSTRUCTIONS. Patient not taking: Reported on 08/07/2017 08/04/17 08/04/18  Theodis Blaze, MD  insulin glargine (LANTUS) 100 UNIT/ML injection Inject 0.7 mLs (70 Units total) into the skin at bedtime. Patient not taking: Reported on 08/07/2017 08/04/17 09/03/17  Theodis Blaze, MD  Insulin Pen Needle (NOVOFINE) 30G X 8 MM MISC Inject insulin into skin per units prescribed. Patient not taking: Reported on 08/07/2017 07/19/17   Scot Jun, FNP  Insulin Syringe-Needle U-100 (TRUEPLUS INSULIN SYRINGE) 31G X 5/16" 0.3 ML MISC Use as directed Patient not taking: Reported on 08/07/2017 07/19/17   Tresa Garter, MD  Multiple Vitamin (MULTIVITAMIN WITH MINERALS) TABS tablet Take 1 tablet by mouth daily.    [provider]    Past Medical, Surgical Family and Social History reviewed and updated.    Objective:   Today's Vitals   08/07/17 0900  BP: (!) 142/76  Pulse: 90  Resp: 16  Temp: 98.1 F (36.7 C)  TempSrc: Oral  SpO2: 98%  Weight: 236 lb (107 kg)  Height: 5\' 3"  (1.6 m)    Wt Readings from Last 3 Encounters:  08/07/17 236 lb (107 kg)  08/04/17 230 lb (104.3 kg)  07/14/17 234 lb (106.1 kg)    Physical Exam Constitutional: Patient appears well-developed and well-nourished. No distress. HENT: Normocephalic,  atraumatic, External right and left ear normal. Oropharynx is clear and moist.  Eyes: Conjunctivae and EOM are normal. PERRLA, no scleral icterus. Neck: Normal ROM. Neck supple. No JVD. No tracheal deviation. No thyromegaly. CVS: RRR, S1/S2 +, no murmurs, no gallops, no carotid bruit.  Pulmonary: Effort and breath sounds normal, no stridor, rhonchi, wheezes, rales.  Abdominal: Soft. BS +, no distension, tenderness, rebound or guarding.  Musculoskeletal: Normal range of motion. No edema and no tenderness.  Neuro: Alert. Normal reflexes, muscle tone coordination. No cranial nerve deficit. Skin: Skin is warm and dry. No rash noted. Not diaphoretic. No erythema. No pallor. Psychiatric: Normal mood and affect. Behavior, judgment, thought content normal.   Assessment & Plan:  1. CKD (chronic kidney disease) stage 4, GFR  15-29 ml/min ([place]), kidney function is worsening. Will follow-up to have patient follow-up with nephrology. Holding lasix for now as patient is not experiencing any edema. Will recheck CBC and CMP today. Continue to avoid nephrotoxic medications.   2. Type 2 diabetes mellitus with stage 4 chronic kidney disease, with long-term current use of insulin ([place]), uncontrolled. Most recent A1C 10.2. CBG Today 193. Will continue medication regimen with the exception: Novolog 8 units TID with meals daily. Continue Lantus 70 units at bedtime.  - Ambulatory referral to diabetic education  3. Hypertension, Essential-uncontrolled, not at goal 130/90 -patient had not taken medication today. Continue hydralazine 25 mg TID. Will consider increasing if BP remains not at goal.   Due to patient history of medication noncompliance will follow patient closely in office to ensure tight glycemic and blood pressure management to prevent worsening of renal function.  RTC: 1 months chronic condition management   Carroll Sage. Kenton Kingfisher, MSN, FNP-C [facility]  [address] [place], [place] [address] [phone]

## 2017-08-08 DIAGNOSIS — Z794 Long term (current) use of insulin: Principal | ICD-10-CM

## 2017-08-08 DIAGNOSIS — E0800 Diabetes mellitus due to underlying condition with hyperosmolarity without nonketotic hyperglycemic-hyperosmolar coma (NKHHC): Secondary | ICD-10-CM

## 2017-08-09 ENCOUNTER — Telehealth: Payer: Self-pay | Admitting: Family Medicine

## 2017-08-09 DIAGNOSIS — E0800 Diabetes mellitus due to underlying condition with hyperosmolarity without nonketotic hyperglycemic-hyperosmolar coma (NKHHC): Secondary | ICD-10-CM

## 2017-08-09 DIAGNOSIS — Z794 Long term (current) use of insulin: Principal | ICD-10-CM

## 2017-08-09 LAB — GLUCOSE, POCT (MANUAL RESULT ENTRY): POC Glucose: 218 mg/dL (ref 70–99)

## 2017-08-09 NOTE — Telephone Encounter (Signed)
Please contact [facility] to inquire as to when patient will be schedule for an appointment. You sent over the referral about 3 weeks prior, however the patient stated she had not been contact to schedule an appointment.

## 2017-08-09 NOTE — Congregational Nurse Program (Signed)
Congregational Nurse Program Note  Date of Encounter: 08/08/2017  Past Medical History: Past Medical History:  Diagnosis Date  . Chronic anemia   . CKD (chronic kidney disease), stage IV ([place])   . Diabetes mellitus ([place])   . Hypertension   . Psychosis ([place])    a. prior adm in [place] ([place]) in 2015 where pt was admitted with hallucinations, seeing men in black outfits, family members on a non-existent camera, hearing voices and seeing animals - UDS positive for methamphetamines at that time and patient stated she believed someone had put this in her body.  . Seizures ([place])     Encounter Details:     CNP Questionnaire - 08/09/17 1541      Patient Demographics   Is this a new or existing patient? Existing   Patient is considered a/an Not Applicable   Race African-American/Black     Patient Assistance   Location of Patient Assistance Not Applicable   Patient's financial/insurance status Medicare   Uninsured Patient ([place] [employer]/Care Connects) No   Patient referred to apply for the following financial assistance Not Applicable   Food insecurities addressed Not Applicable   Transportation assistance No   Assistance securing medications No   Educational health offerings Diabetes;Chronic disease;Medications;Navigating the healthcare system;Hypertension     Encounter Details   Primary purpose of visit Education/Health Concerns;Spiritual Care/Support Visit;Post ED/Hospitalization Visit;Chronic Illness/Condition Visit   Was an Emergency Department visit averted? Not Applicable   Does patient have a medical provider? Yes   Patient referred to Follow up with established PCP   Was a mental health screening completed? (GAINS tool) No   Does patient have dental issues? No   Does patient have vision issues? No   Was a vision referral made? No   Does your patient have an abnormal blood pressure today? No   Since previous encounter, have you referred patient for abnormal  blood pressure that resulted in a new diagnosis or medication change? No   Does your patient have an abnormal blood glucose today? Yes   Since previous encounter, have you referred patient for abnormal blood glucose that resulted in a new diagnosis or medication change? No   Was there a life-saving intervention made? No     Client in past her hospital admission on 08-03-17  For  elevated blood  Sugar. States blood  Sugar was around 600 mg when admitted , today blood sugar at 3::24 pm was 218 mg ,counseled regarding importance of staying on her medication and the need to complete the drug assistance medicare form. Client states she has completed it and ask the nurse to mail it  For her which was done. To talk with student SW today to arrange  for counseling.. States she feels better today lots of energy . She is taking 70 units  Of her insulin and on sliding scale she states. Counseled regarding A! -C and what it means ,gave client a chart and referred her to the Diabetes support group and encouraged her to attend.

## 2017-08-10 NOTE — Telephone Encounter (Signed)
Amber Lara at [facility] states that she called patient on 10/17 and left a vm for patient to call and schedule. She will reach out to patient again today to try and schedule

## 2017-08-11 NOTE — Telephone Encounter (Signed)
Patient schedule for 12/14. Patient wanted to wait that long because she was moving.

## 2017-08-14 LAB — GLUCOSE, POCT (MANUAL RESULT ENTRY): POC Glucose: 376 mg/dl (ref 70–99)
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
## 2017-09-05 ENCOUNTER — Ambulatory Visit: Payer: Medicare Other | Admitting: Family Medicine

## 2017-10-23 ENCOUNTER — Telehealth: Payer: Self-pay

## 2018-01-04 NOTE — Telephone Encounter (Signed)
Note not needed 

## 2018-05-09 DIAGNOSIS — M79602 Pain in left arm: Secondary | ICD-10-CM | POA: Diagnosis not present

## 2018-06-02 ENCOUNTER — Inpatient Hospital Stay (HOSPITAL_COMMUNITY)
Admission: EM | Admit: 2018-06-02 | Discharge: 2018-06-08 | DRG: 253 | Disposition: A | Discharge status: Home / Self Care | Payer: Medicare Other | Attending: Internal Medicine | Admitting: Internal Medicine

## 2018-06-02 ENCOUNTER — Encounter (HOSPITAL_COMMUNITY): Payer: Self-pay | Admitting: Oncology

## 2018-06-02 ENCOUNTER — Emergency Department (HOSPITAL_COMMUNITY): Payer: Medicare Other

## 2018-06-02 DIAGNOSIS — D509 Iron deficiency anemia, unspecified: Secondary | ICD-10-CM | POA: Diagnosis present

## 2018-06-02 DIAGNOSIS — N179 Acute kidney failure, unspecified: Secondary | ICD-10-CM | POA: Diagnosis present

## 2018-06-02 DIAGNOSIS — I440 Atrioventricular block, first degree: Secondary | ICD-10-CM | POA: Diagnosis not present

## 2018-06-02 DIAGNOSIS — I4430 Unspecified atrioventricular block: Secondary | ICD-10-CM | POA: Diagnosis not present

## 2018-06-02 DIAGNOSIS — N185 Chronic kidney disease, stage 5: Secondary | ICD-10-CM | POA: Diagnosis present

## 2018-06-02 DIAGNOSIS — R079 Chest pain, unspecified: Secondary | ICD-10-CM | POA: Diagnosis not present

## 2018-06-02 DIAGNOSIS — I161 Hypertensive emergency: Secondary | ICD-10-CM | POA: Diagnosis not present

## 2018-06-02 DIAGNOSIS — R0789 Other chest pain: Secondary | ICD-10-CM | POA: Diagnosis not present

## 2018-06-02 DIAGNOSIS — Z880 Allergy status to penicillin: Secondary | ICD-10-CM

## 2018-06-02 DIAGNOSIS — Z79899 Other long term (current) drug therapy: Secondary | ICD-10-CM

## 2018-06-02 DIAGNOSIS — Z9114 Patient's other noncompliance with medication regimen: Secondary | ICD-10-CM

## 2018-06-02 DIAGNOSIS — E785 Hyperlipidemia, unspecified: Secondary | ICD-10-CM | POA: Diagnosis present

## 2018-06-02 DIAGNOSIS — E1122 Type 2 diabetes mellitus with diabetic chronic kidney disease: Secondary | ICD-10-CM | POA: Diagnosis present

## 2018-06-02 DIAGNOSIS — Z794 Long term (current) use of insulin: Secondary | ICD-10-CM

## 2018-06-02 DIAGNOSIS — Z888 Allergy status to other drugs, medicaments and biological substances status: Secondary | ICD-10-CM

## 2018-06-02 DIAGNOSIS — Z885 Allergy status to narcotic agent status: Secondary | ICD-10-CM

## 2018-06-02 DIAGNOSIS — Z87891 Personal history of nicotine dependence: Secondary | ICD-10-CM

## 2018-06-02 DIAGNOSIS — Z9119 Patient's noncompliance with other medical treatment and regimen: Secondary | ICD-10-CM

## 2018-06-02 DIAGNOSIS — I2510 Atherosclerotic heart disease of native coronary artery without angina pectoris: Secondary | ICD-10-CM | POA: Diagnosis present

## 2018-06-02 DIAGNOSIS — G40909 Epilepsy, unspecified, not intractable, without status epilepticus: Secondary | ICD-10-CM | POA: Diagnosis present

## 2018-06-02 DIAGNOSIS — Z590 Homelessness: Secondary | ICD-10-CM

## 2018-06-02 DIAGNOSIS — I82611 Acute embolism and thrombosis of superficial veins of right upper extremity: Secondary | ICD-10-CM | POA: Diagnosis present

## 2018-06-02 DIAGNOSIS — M25519 Pain in unspecified shoulder: Secondary | ICD-10-CM

## 2018-06-02 DIAGNOSIS — D631 Anemia in chronic kidney disease: Secondary | ICD-10-CM | POA: Diagnosis present

## 2018-06-02 DIAGNOSIS — I10 Essential (primary) hypertension: Secondary | ICD-10-CM | POA: Diagnosis not present

## 2018-06-02 DIAGNOSIS — F209 Schizophrenia, unspecified: Secondary | ICD-10-CM | POA: Diagnosis present

## 2018-06-02 DIAGNOSIS — E1165 Type 2 diabetes mellitus with hyperglycemia: Secondary | ICD-10-CM | POA: Diagnosis present

## 2018-06-02 DIAGNOSIS — I129 Hypertensive chronic kidney disease with stage 1 through stage 4 chronic kidney disease, or unspecified chronic kidney disease: Secondary | ICD-10-CM | POA: Diagnosis present

## 2018-06-02 LAB — CBC
HCT: 30.2 % — ABNORMAL LOW (ref 36.0–46.0)
HEMOGLOBIN: 9.4 g/dL — AB (ref 12.0–15.0)
MCH: 25.3 pg — AB (ref 26.0–34.0)
MCHC: 31.1 g/dL (ref 30.0–36.0)
MCV: 81.4 fL (ref 78.0–100.0)
Platelets: 306 10*3/uL (ref 150–400)
RBC: 3.71 MIL/uL — AB (ref 3.87–5.11)
RDW: 14.9 % (ref 11.5–15.5)
WBC: 6.1 10*3/uL (ref 4.0–10.5)

## 2018-06-02 LAB — BASIC METABOLIC PANEL
Anion gap: 7 (ref 5–15)
BUN: 25 mg/dL — AB (ref 6–20)
CHLORIDE: 109 mmol/L (ref 98–111)
CO2: 21 mmol/L — ABNORMAL LOW (ref 22–32)
CREATININE: 4.12 mg/dL — AB (ref 0.44–1.00)
Calcium: 9 mg/dL (ref 8.9–10.3)
GFR, EST AFRICAN AMERICAN: 13 mL/min — AB (ref >60)
GFR, EST NON AFRICAN AMERICAN: 11 mL/min — AB (ref >60)
Glucose, Bld: 316 mg/dL — ABNORMAL HIGH (ref 70–99)
POTASSIUM: 4 mmol/L (ref 3.5–5.1)
SODIUM: 137 mmol/L (ref 135–145)

## 2018-06-02 LAB — I-STAT TROPONIN, ED
TROPONIN I, POC: 0.01 ng/mL (ref 0.00–0.08)
Troponin i, poc: 0.02 ng/mL (ref 0.00–0.08)

## 2018-06-02 LAB — BRAIN NATRIURETIC PEPTIDE: B Natriuretic Peptide: 9.5 pg/mL (ref 0.0–100.0)

## 2018-06-02 LAB — CBG MONITORING, ED: GLUCOSE-CAPILLARY: 286 mg/dL — AB (ref 70–99)

## 2018-06-02 LAB — GLUCOSE, CAPILLARY
GLUCOSE-CAPILLARY: 316 mg/dL — AB (ref 70–99)
Glucose-Capillary: 227 mg/dL — ABNORMAL HIGH (ref 70–99)

## 2018-06-02 LAB — TROPONIN I
Troponin I: <0.03 ng/mL (ref <0.03)
Troponin I: <0.03 ng/mL (ref <0.03)

## 2018-06-02 MED ORDER — HEPARIN SODIUM (PORCINE) 5000 UNIT/ML IJ SOLN
5000.0000 [IU] | Freq: Three times a day (TID) | INTRAMUSCULAR | Status: DC
  Administered 2018-06-02 – 2018-06-08 (×8): 5000 [IU] via SUBCUTANEOUS
  Filled 2018-06-02 (×10): qty 1

## 2018-06-02 MED ORDER — LEVETIRACETAM 500 MG PO TABS
1000.0000 mg | ORAL_TABLET | Freq: Two times a day (BID) | ORAL | Status: DC
  Administered 2018-06-02 – 2018-06-05 (×7): 1000 mg via ORAL
  Filled 2018-06-02 (×7): qty 2

## 2018-06-02 MED ORDER — ZOLPIDEM TARTRATE 5 MG PO TABS: 5.0000 mg | ORAL_TABLET | Freq: Every evening | ORAL | Status: DC | PRN

## 2018-06-02 MED ORDER — INSULIN GLARGINE 100 UNIT/ML ~~LOC~~ SOLN
70.0000 [IU] | Freq: Every day | SUBCUTANEOUS | Status: DC
  Administered 2018-06-02 – 2018-06-03 (×2): 70 [IU] via SUBCUTANEOUS
  Filled 2018-06-02 (×3): qty 0.7

## 2018-06-02 MED ORDER — ASPIRIN EC 325 MG PO TBEC
325.0000 mg | DELAYED_RELEASE_TABLET | Freq: Every day | ORAL | Status: DC
  Administered 2018-06-02 – 2018-06-07 (×6): 325 mg via ORAL
  Filled 2018-06-02 (×6): qty 1

## 2018-06-02 MED ORDER — GI COCKTAIL ~~LOC~~: 30.0000 mL | Freq: Four times a day (QID) | ORAL | Status: DC | PRN

## 2018-06-02 MED ORDER — HYDRALAZINE HCL 25 MG PO TABS
25.0000 mg | ORAL_TABLET | Freq: Three times a day (TID) | ORAL | Status: DC
  Administered 2018-06-02 – 2018-06-03 (×5): 25 mg via ORAL
  Filled 2018-06-02 (×5): qty 1

## 2018-06-02 MED ORDER — METOPROLOL TARTRATE 25 MG PO TABS
25.0000 mg | ORAL_TABLET | Freq: Two times a day (BID) | ORAL | Status: DC
  Administered 2018-06-02 – 2018-06-03 (×3): 25 mg via ORAL
  Filled 2018-06-02 (×3): qty 1

## 2018-06-02 MED ORDER — FERROUS FUMARATE 324 (106 FE) MG PO TABS
1.0000 | ORAL_TABLET | Freq: Every day | ORAL | Status: DC
  Administered 2018-06-02 – 2018-06-08 (×6): 106 mg via ORAL
  Filled 2018-06-02 (×7): qty 1

## 2018-06-02 MED ORDER — DOCUSATE SODIUM 100 MG PO CAPS
200.0000 mg | ORAL_CAPSULE | Freq: Every day | ORAL | Status: DC
  Administered 2018-06-03 – 2018-06-08 (×6): 200 mg via ORAL
  Filled 2018-06-02 (×6): qty 2

## 2018-06-02 MED ORDER — BENZTROPINE MESYLATE 2 MG PO TABS
2.0000 mg | ORAL_TABLET | Freq: Two times a day (BID) | ORAL | Status: DC
  Administered 2018-06-02 (×2): 2 mg via ORAL
  Filled 2018-06-02: qty 2
  Filled 2018-06-02 (×2): qty 1

## 2018-06-02 MED ORDER — AMLODIPINE BESYLATE 5 MG PO TABS
5.0000 mg | ORAL_TABLET | Freq: Every day | ORAL | Status: DC
  Administered 2018-06-02 – 2018-06-07 (×6): 5 mg via ORAL
  Filled 2018-06-02 (×6): qty 1

## 2018-06-02 MED ORDER — ONDANSETRON HCL 4 MG/2ML IJ SOLN: 4.0000 mg | Freq: Four times a day (QID) | INTRAMUSCULAR | Status: DC | PRN

## 2018-06-02 MED ORDER — ACETAMINOPHEN 325 MG PO TABS
650.0000 mg | ORAL_TABLET | ORAL | Status: DC | PRN
  Administered 2018-06-02 – 2018-06-07 (×4): 650 mg via ORAL
  Filled 2018-06-02 (×4): qty 2

## 2018-06-02 MED ORDER — ADULT MULTIVITAMIN W/MINERALS CH
1.0000 | ORAL_TABLET | Freq: Every day | ORAL | Status: DC
  Administered 2018-06-02 – 2018-06-08 (×7): 1 via ORAL
  Filled 2018-06-02 (×7): qty 1

## 2018-06-02 MED ORDER — FUROSEMIDE 20 MG PO TABS
20.0000 mg | ORAL_TABLET | Freq: Every day | ORAL | Status: DC
  Administered 2018-06-02: 20 mg via ORAL
  Filled 2018-06-02: qty 1

## 2018-06-02 MED ORDER — INSULIN ASPART 100 UNIT/ML ~~LOC~~ SOLN
8.0000 [IU] | Freq: Three times a day (TID) | SUBCUTANEOUS | Status: DC
  Administered 2018-06-02 – 2018-06-07 (×13): 8 [IU] via SUBCUTANEOUS
  Filled 2018-06-02: qty 1

## 2018-06-02 MED ORDER — SODIUM CHLORIDE 0.9 % IV BOLUS
500.0000 mL | Freq: Once | INTRAVENOUS | Status: AC
  Administered 2018-06-02: 500 mL via INTRAVENOUS

## 2018-06-02 MED ORDER — GABAPENTIN 300 MG PO CAPS: 300.0000 mg | ORAL_CAPSULE | Freq: Three times a day (TID) | ORAL | Status: DC | PRN
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
## 2018-06-02 NOTE — ED Notes (Signed)
Gave pt cranberry juice per Kayla-RN.
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
## 2018-06-02 NOTE — ED Notes (Signed)
Patient transported to X-ray 

## 2018-06-02 NOTE — ED Notes (Signed)
Checked patient blood sugar it was 286 notified RN of blood sugar patient is resting with call bell in reach
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
## 2018-06-02 NOTE — ED Notes (Signed)
Pt ambulatory to bathroom

## 2018-06-03 ENCOUNTER — Encounter (HOSPITAL_COMMUNITY): Payer: Self-pay | Admitting: Nephrology

## 2018-06-03 DIAGNOSIS — R079 Chest pain, unspecified: Secondary | ICD-10-CM | POA: Diagnosis not present

## 2018-06-03 DIAGNOSIS — Z794 Long term (current) use of insulin: Secondary | ICD-10-CM | POA: Diagnosis not present

## 2018-06-03 DIAGNOSIS — I160 Hypertensive urgency: Secondary | ICD-10-CM

## 2018-06-03 DIAGNOSIS — E1165 Type 2 diabetes mellitus with hyperglycemia: Secondary | ICD-10-CM | POA: Diagnosis not present

## 2018-06-03 LAB — CBC
HCT: 30.5 % — ABNORMAL LOW (ref 36.0–46.0)
Hemoglobin: 9.5 g/dL — ABNORMAL LOW (ref 12.0–15.0)
MCH: 25.3 pg — ABNORMAL LOW (ref 26.0–34.0)
MCHC: 31.1 g/dL (ref 30.0–36.0)
MCV: 81.1 fL (ref 78.0–100.0)
PLATELETS: 330 10*3/uL (ref 150–400)
RBC: 3.76 MIL/uL — ABNORMAL LOW (ref 3.87–5.11)
RDW: 14.9 % (ref 11.5–15.5)
WBC: 7 10*3/uL (ref 4.0–10.5)

## 2018-06-03 LAB — GLUCOSE, CAPILLARY
GLUCOSE-CAPILLARY: 284 mg/dL — AB (ref 70–99)
Glucose-Capillary: 203 mg/dL — ABNORMAL HIGH (ref 70–99)
Glucose-Capillary: 231 mg/dL — ABNORMAL HIGH (ref 70–99)
Glucose-Capillary: 165 mg/dL — ABNORMAL HIGH (ref 70–99)

## 2018-06-03 LAB — BASIC METABOLIC PANEL
Anion gap: 9 (ref 5–15)
BUN: 27 mg/dL — AB (ref 6–20)
CALCIUM: 8.6 mg/dL — AB (ref 8.9–10.3)
CO2: 20 mmol/L — ABNORMAL LOW (ref 22–32)
CREATININE: 4.17 mg/dL — AB (ref 0.44–1.00)
Chloride: 103 mmol/L (ref 98–111)
GFR calc Af Amer: 13 mL/min — ABNORMAL LOW (ref >60)
GFR, EST NON AFRICAN AMERICAN: 11 mL/min — AB (ref >60)
Glucose, Bld: 219 mg/dL — ABNORMAL HIGH (ref 70–99)
Potassium: 3.8 mmol/L (ref 3.5–5.1)
Sodium: 132 mmol/L — ABNORMAL LOW (ref 135–145)

## 2018-06-03 LAB — HEMOGLOBIN A1C
HEMOGLOBIN A1C: 11.8 % — AB (ref 4.8–5.6)
Mean Plasma Glucose: 291.96 mg/dL

## 2018-06-03 MED ORDER — FUROSEMIDE 40 MG PO TABS
40.0000 mg | ORAL_TABLET | Freq: Every day | ORAL | Status: DC
  Administered 2018-06-03 – 2018-06-08 (×6): 40 mg via ORAL
  Filled 2018-06-03 (×6): qty 1

## 2018-06-03 MED ORDER — METOPROLOL TARTRATE 50 MG PO TABS
50.0000 mg | ORAL_TABLET | Freq: Two times a day (BID) | ORAL | Status: DC
  Administered 2018-06-03 – 2018-06-08 (×10): 50 mg via ORAL
  Filled 2018-06-03 (×10): qty 1

## 2018-06-03 MED ORDER — INSULIN ASPART 100 UNIT/ML ~~LOC~~ SOLN
0.0000 [IU] | Freq: Three times a day (TID) | SUBCUTANEOUS | Status: DC
  Administered 2018-06-03: 5 [IU] via SUBCUTANEOUS
  Administered 2018-06-06: 3 [IU] via SUBCUTANEOUS
  Administered 2018-06-06: 2 [IU] via SUBCUTANEOUS
  Administered 2018-06-07: 7 [IU] via SUBCUTANEOUS
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
## 2018-06-04 ENCOUNTER — Inpatient Hospital Stay (HOSPITAL_COMMUNITY): Payer: Medicare Other

## 2018-06-04 ENCOUNTER — Observation Stay (HOSPITAL_COMMUNITY): Payer: Medicare Other

## 2018-06-04 ENCOUNTER — Other Ambulatory Visit: Payer: Self-pay

## 2018-06-04 DIAGNOSIS — I10 Essential (primary) hypertension: Secondary | ICD-10-CM | POA: Diagnosis not present

## 2018-06-04 DIAGNOSIS — M25512 Pain in left shoulder: Secondary | ICD-10-CM | POA: Diagnosis not present

## 2018-06-04 DIAGNOSIS — R600 Localized edema: Secondary | ICD-10-CM

## 2018-06-04 DIAGNOSIS — Z9114 Patient's other noncompliance with medication regimen: Secondary | ICD-10-CM | POA: Diagnosis not present

## 2018-06-04 DIAGNOSIS — Z87891 Personal history of nicotine dependence: Secondary | ICD-10-CM | POA: Diagnosis not present

## 2018-06-04 DIAGNOSIS — Z590 Homelessness: Secondary | ICD-10-CM | POA: Diagnosis not present

## 2018-06-04 DIAGNOSIS — I129 Hypertensive chronic kidney disease with stage 1 through stage 4 chronic kidney disease, or unspecified chronic kidney disease: Secondary | ICD-10-CM | POA: Diagnosis present

## 2018-06-04 DIAGNOSIS — F209 Schizophrenia, unspecified: Secondary | ICD-10-CM | POA: Diagnosis present

## 2018-06-04 DIAGNOSIS — Z79899 Other long term (current) drug therapy: Secondary | ICD-10-CM | POA: Diagnosis not present

## 2018-06-04 DIAGNOSIS — Z9119 Patient's noncompliance with other medical treatment and regimen: Secondary | ICD-10-CM | POA: Diagnosis not present

## 2018-06-04 DIAGNOSIS — E119 Type 2 diabetes mellitus without complications: Secondary | ICD-10-CM | POA: Diagnosis not present

## 2018-06-04 DIAGNOSIS — I2510 Atherosclerotic heart disease of native coronary artery without angina pectoris: Secondary | ICD-10-CM | POA: Diagnosis present

## 2018-06-04 DIAGNOSIS — R079 Chest pain, unspecified: Secondary | ICD-10-CM | POA: Diagnosis present

## 2018-06-04 DIAGNOSIS — G40909 Epilepsy, unspecified, not intractable, without status epilepticus: Secondary | ICD-10-CM | POA: Diagnosis present

## 2018-06-04 DIAGNOSIS — Z880 Allergy status to penicillin: Secondary | ICD-10-CM | POA: Diagnosis not present

## 2018-06-04 DIAGNOSIS — I161 Hypertensive emergency: Secondary | ICD-10-CM | POA: Diagnosis present

## 2018-06-04 DIAGNOSIS — N185 Chronic kidney disease, stage 5: Secondary | ICD-10-CM | POA: Diagnosis present

## 2018-06-04 DIAGNOSIS — Z888 Allergy status to other drugs, medicaments and biological substances status: Secondary | ICD-10-CM | POA: Diagnosis not present

## 2018-06-04 DIAGNOSIS — D631 Anemia in chronic kidney disease: Secondary | ICD-10-CM | POA: Diagnosis present

## 2018-06-04 DIAGNOSIS — Z885 Allergy status to narcotic agent status: Secondary | ICD-10-CM | POA: Diagnosis not present

## 2018-06-04 DIAGNOSIS — E1165 Type 2 diabetes mellitus with hyperglycemia: Secondary | ICD-10-CM | POA: Diagnosis present

## 2018-06-04 DIAGNOSIS — N179 Acute kidney failure, unspecified: Secondary | ICD-10-CM | POA: Diagnosis present

## 2018-06-04 DIAGNOSIS — N170 Acute kidney failure with tubular necrosis: Secondary | ICD-10-CM | POA: Diagnosis not present

## 2018-06-04 DIAGNOSIS — Z794 Long term (current) use of insulin: Secondary | ICD-10-CM | POA: Diagnosis not present

## 2018-06-04 DIAGNOSIS — E1122 Type 2 diabetes mellitus with diabetic chronic kidney disease: Secondary | ICD-10-CM | POA: Diagnosis present

## 2018-06-04 DIAGNOSIS — I82611 Acute embolism and thrombosis of superficial veins of right upper extremity: Secondary | ICD-10-CM | POA: Diagnosis present

## 2018-06-04 DIAGNOSIS — D509 Iron deficiency anemia, unspecified: Secondary | ICD-10-CM | POA: Diagnosis present

## 2018-06-04 DIAGNOSIS — E785 Hyperlipidemia, unspecified: Secondary | ICD-10-CM | POA: Diagnosis present

## 2018-06-04 LAB — BASIC METABOLIC PANEL
Anion gap: 7 (ref 5–15)
BUN: 37 mg/dL — ABNORMAL HIGH (ref 6–20)
CALCIUM: 8.5 mg/dL — AB (ref 8.9–10.3)
CO2: 22 mmol/L (ref 22–32)
CREATININE: 4.16 mg/dL — AB (ref 0.44–1.00)
Chloride: 108 mmol/L (ref 98–111)
GFR calc non Af Amer: 11 mL/min — ABNORMAL LOW (ref >60)
GFR, EST AFRICAN AMERICAN: 13 mL/min — AB (ref >60)
Glucose, Bld: 92 mg/dL (ref 70–99)
Potassium: 3.7 mmol/L (ref 3.5–5.1)
SODIUM: 137 mmol/L (ref 135–145)

## 2018-06-04 LAB — RETICULOCYTES
RBC.: 3.64 MIL/uL — ABNORMAL LOW (ref 3.87–5.11)
Retic Count, Absolute: 51 10*3/uL (ref 19.0–186.0)
Retic Ct Pct: 1.4 % (ref 0.4–3.1)

## 2018-06-04 LAB — GLUCOSE, CAPILLARY
GLUCOSE-CAPILLARY: 109 mg/dL — AB (ref 70–99)
GLUCOSE-CAPILLARY: 86 mg/dL (ref 70–99)
Glucose-Capillary: 82 mg/dL (ref 70–99)
Glucose-Capillary: 122 mg/dL — ABNORMAL HIGH (ref 70–99)

## 2018-06-04 LAB — IRON AND TIBC
IRON: 24 ug/dL — AB (ref 28–170)
SATURATION RATIOS: 10 % — AB (ref 10.4–31.8)
TIBC: 231 ug/dL — AB (ref 250–450)
UIBC: 207 ug/dL

## 2018-06-04 LAB — VITAMIN B12: Vitamin B-12: 939 pg/mL — ABNORMAL HIGH (ref 180–914)

## 2018-06-04 LAB — FOLATE: FOLATE: 9.7 ng/mL (ref >5.9)

## 2018-06-04 LAB — FERRITIN: Ferritin: 45 ng/mL (ref 11–307)

## 2018-06-04 MED ORDER — INSULIN GLARGINE 100 UNIT/ML ~~LOC~~ SOLN
60.0000 [IU] | Freq: Every day | SUBCUTANEOUS | Status: DC
  Administered 2018-06-04 – 2018-06-06 (×3): 60 [IU] via SUBCUTANEOUS
  Filled 2018-06-04 (×3): qty 0.6

## 2018-06-04 MED ORDER — SODIUM CHLORIDE 0.9 % IV SOLN
510.0000 mg | INTRAVENOUS | Status: AC
  Administered 2018-06-04 – 2018-06-07 (×2): 510 mg via INTRAVENOUS
  Filled 2018-06-04 (×2): qty 17

## 2018-06-04 NOTE — Care Management Note (Signed)
Case Management Note  Patient Details  Name: Amber Lara MRN: [id number] Date of Birth: 03-02-1961  Subjective/Objective:  Pt presented for Hypertension Urgency. Patient states she is homeless and living in her car. Patient has family in [place], however she is not able to stay with them. CSW consulted for resources for apartment vs shelter. Pt has [place] Medicare PCP- on file listed as Patient [place]. Patient will be able to utilize the [facility] for Medications- cost ranges from $4.00-$10.00.  Rx's will need to be sent to [facility] to be filled.                     Action/Plan: Hospital Follow up scheduled for Sept 3rd, 2019 with Kathe Becton NP. Information placed on AVS.  No further needs from CM at this time.   Expected Discharge Date:                  Expected Discharge Plan:  Home/Self Care  In-House Referral:  NA  Discharge planning Services  CM Consult, Follow-up appt scheduled, [facility], Medication Assistance  Post Acute Care Choice:  NA Choice offered to:  NA  DME Arranged:  N/A DME Agency:  NA  HH Arranged:  NA HH Agency:  NA  Status of Service:  Completed, signed off  If discussed at [place] of Stay Meetings, dates discussed:    Additional Comments:  Bethena Roys, RN 06/04/2018, 11:47 AM

## 2018-06-04 NOTE — Progress Notes (Signed)
Inpatient Diabetes Program Recommendations  AACE/ADA: New Consensus Statement on Inpatient Glycemic Control (2015)  Target Ranges:  Prepandial:   less than 140 mg/dL      Peak postprandial:   less than 180 mg/dL (1-2 hours)      Critically ill patients:  140 - 180 mg/dL   Spoke with patient about diabetes and home regimen for diabetes control. Patient reports she has relocated within the past 1-2 weeks here to [facility], Patient says she has children here. Patient repots seeing her PCP about 3 months ago. Patient also says she has been rationing her insulin so she wouldn't run out. Patient takes Novolog and Lantus at home. Patient reports she only takes Lantus 50 units at home due to her feeling like she is having a low glucose. And says her fasting glucose is in the low 100's but she spikes during the daytime.   Spoke with patient about current inpatient regimen and also discussed current A1c and glucose goals. Discussed lifestyle changes. Patient had many questions about dietary changes. Encouraged physical activity.  Patient will need insulin refills at time of d/c.  CM to set patient up with the clinic. Patient verbalized understanding of information and have no further questions at this time related to diabetes.  Thanks,  Tama Headings RN, MSN, [facility] Inpatient Diabetes Coordinator Team Pager [phone] (8a-5p)

## 2018-06-04 NOTE — Care Management Obs Status (Signed)
[place] NOTIFICATION   Patient Details  Name: Shalona Harbour MRN: [id number] Date of Birth: 21-Sep-1961   Medicare Observation Status Notification Given:  Yes    Bethena Roys, RN 06/04/2018, 11:38 AM
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
## 2018-06-04 NOTE — Plan of Care (Signed)
  Problem: Clinical Measurements: Goal: Respiratory complications will improve Outcome: Completed/Met   Problem: Pain Managment: Goal: General experience of comfort will improve Outcome: Completed/Met
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
## 2018-06-04 NOTE — Progress Notes (Signed)
UPPER EXTREMITY VEIN MAPPING COMPLETED:       Abram Sander 06/04/2018 3:44 PM

## 2018-06-05 ENCOUNTER — Encounter (HOSPITAL_COMMUNITY): Payer: Self-pay | Admitting: Physician Assistant

## 2018-06-05 DIAGNOSIS — N189 Chronic kidney disease, unspecified: Secondary | ICD-10-CM

## 2018-06-05 DIAGNOSIS — M25511 Pain in right shoulder: Secondary | ICD-10-CM

## 2018-06-05 DIAGNOSIS — E119 Type 2 diabetes mellitus without complications: Secondary | ICD-10-CM

## 2018-06-05 DIAGNOSIS — N179 Acute kidney failure, unspecified: Secondary | ICD-10-CM

## 2018-06-05 DIAGNOSIS — I10 Essential (primary) hypertension: Secondary | ICD-10-CM

## 2018-06-05 DIAGNOSIS — N185 Chronic kidney disease, stage 5: Secondary | ICD-10-CM

## 2018-06-05 LAB — MAGNESIUM: Magnesium: 2.3 mg/dL (ref 1.7–2.4)

## 2018-06-05 LAB — RENAL FUNCTION PANEL
ALBUMIN: 2.4 g/dL — AB (ref 3.5–5.0)
ANION GAP: 8 (ref 5–15)
BUN: 37 mg/dL — ABNORMAL HIGH (ref 6–20)
CHLORIDE: 109 mmol/L (ref 98–111)
CO2: 21 mmol/L — ABNORMAL LOW (ref 22–32)
Calcium: 8.9 mg/dL (ref 8.9–10.3)
Creatinine, Ser: 4.09 mg/dL — ABNORMAL HIGH (ref 0.44–1.00)
GFR calc Af Amer: 13 mL/min — ABNORMAL LOW (ref >60)
GFR calc non Af Amer: 11 mL/min — ABNORMAL LOW (ref >60)
GLUCOSE: 127 mg/dL — AB (ref 70–99)
PHOSPHORUS: 3.8 mg/dL (ref 2.5–4.6)
POTASSIUM: 3.8 mmol/L (ref 3.5–5.1)
Sodium: 138 mmol/L (ref 135–145)

## 2018-06-05 LAB — GLUCOSE, CAPILLARY
GLUCOSE-CAPILLARY: 122 mg/dL — AB (ref 70–99)
Glucose-Capillary: 91 mg/dL (ref 70–99)
Glucose-Capillary: 105 mg/dL — ABNORMAL HIGH (ref 70–99)
Glucose-Capillary: 176 mg/dL — ABNORMAL HIGH (ref 70–99)

## 2018-06-05 MED ORDER — LEVETIRACETAM 500 MG PO TABS
500.0000 mg | ORAL_TABLET | Freq: Two times a day (BID) | ORAL | Status: DC
  Administered 2018-06-05 – 2018-06-08 (×6): 500 mg via ORAL
  Filled 2018-06-05 (×6): qty 1

## 2018-06-05 NOTE — Clinical Social Work Note (Signed)
Clinical Social Work Assessment  Patient Details  Name: Amber Lara MRN: [id number] Date of Birth: 09-Dec-1960  Date of referral:  06/05/18               Reason for consult:  Housing Concerns/Homelessness, Tax inspector sought to share information with:    Permission granted to share information::     Name::        Agency::     Relationship::     Contact Information:     Housing/Transportation Living arrangements for the past 2 months:  Homeless Source of Information:  Patient Patient Interpreter Needed:  None Criminal Activity/Legal Involvement Pertinent to Current Situation/Hospitalization:  No - Comment as needed Significant Relationships:  Adult Children, Siblings Lives with:  Self Do you feel safe going back to the place where you live?  No Need for family participation in patient care:  No (Coment)  Care giving concerns: CSW consulted for patient homelessness.   Social Worker assessment / plan: CSW met with patient at bedside. Patient alert and oriented, ambulating around room. CSW introduced self and role and discussed reason for consult. Patient reported she has been living in her car for about three weeks, since leaving [place]. Patient reported she has family here in [place] but will not stay with them. Patient reported she has income from social security and is looking for permanent housing.  CSW offered shelter list, senior housing list, list of free meals in the community, and information on [facility]). Patient appreciative of resources.  Patient reported her car is located at [facility], where she was visiting a friend. Patient will need assistance getting back to her car at discharge. CSW to support with transportation.   Employment status:  Disabled (Comment on whether or not currently receiving Disability) Insurance information:  Managed Medicare([employer]) PT Recommendations:  Not assessed at this  time Information / Referral to community resources:  Shelter, Other (Comment Required)  Patient/Family's Response to care: Patient appreciative of care.  Patient/Family's Understanding of and Emotional Response to Diagnosis, Current Treatment, and Prognosis: Patient with good understanding of her condition.   Emotional Assessment Appearance:  Appears stated age Attitude/Demeanor/Rapport:  Engaged Affect (typically observed):  Accepting, Appropriate, Pleasant Orientation:  Oriented to Self, Oriented to Place, Oriented to  Time, Oriented to Situation Alcohol / Substance use:  Not Applicable Psych involvement (Current and /or in the community):  No (Comment)  Discharge Needs  Concerns to be addressed:  Homelessness Readmission within the last 30 days:  No Current discharge risk:  Homeless Barriers to Discharge:  Continued Medical Work up, Homeless with medical needs   Estanislado Emms, LCSW 06/05/2018, 12:42 PM
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
## 2018-06-06 DIAGNOSIS — I161 Hypertensive emergency: Principal | ICD-10-CM

## 2018-06-06 LAB — RENAL FUNCTION PANEL
Albumin: 2.6 g/dL — ABNORMAL LOW (ref 3.5–5.0)
Anion gap: 10 (ref 5–15)
BUN: 46 mg/dL — AB (ref 6–20)
CALCIUM: 9 mg/dL (ref 8.9–10.3)
CHLORIDE: 105 mmol/L (ref 98–111)
CO2: 21 mmol/L — AB (ref 22–32)
CREATININE: 4.35 mg/dL — AB (ref 0.44–1.00)
GFR, EST AFRICAN AMERICAN: 12 mL/min — AB (ref >60)
GFR, EST NON AFRICAN AMERICAN: 10 mL/min — AB (ref >60)
Glucose, Bld: 134 mg/dL — ABNORMAL HIGH (ref 70–99)
Phosphorus: 4.2 mg/dL (ref 2.5–4.6)
Potassium: 4.7 mmol/L (ref 3.5–5.1)
SODIUM: 136 mmol/L (ref 135–145)

## 2018-06-06 LAB — VITAMIN D 25 HYDROXY (VIT D DEFICIENCY, FRACTURES): Vit D, 25-Hydroxy: 8.8 ng/mL — ABNORMAL LOW (ref 30.0–100.0)

## 2018-06-06 LAB — GLUCOSE, CAPILLARY
GLUCOSE-CAPILLARY: 169 mg/dL — AB (ref 70–99)
GLUCOSE-CAPILLARY: 79 mg/dL (ref 70–99)
Glucose-Capillary: 219 mg/dL — ABNORMAL HIGH (ref 70–99)
Glucose-Capillary: 139 mg/dL — ABNORMAL HIGH (ref 70–99)

## 2018-06-06 LAB — PTH, INTACT AND CALCIUM
Calcium, Total (PTH): 8.9 mg/dL (ref 8.7–10.2)
PTH: 99 pg/mL — AB (ref 15–65)

## 2018-06-06 MED ORDER — VITAMIN D (ERGOCALCIFEROL) 1.25 MG (50000 UNIT) PO CAPS
50000.0000 [IU] | ORAL_CAPSULE | ORAL | Status: DC
  Administered 2018-06-06: 50000 [IU] via ORAL
  Filled 2018-06-06: qty 1
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
## 2018-06-06 NOTE — Plan of Care (Signed)
  Problem: Coping: Goal: Level of anxiety will decrease Outcome: Completed/Met   Problem: Safety: Goal: Ability to remain free from injury will improve Outcome: Completed/Met

## 2018-06-07 ENCOUNTER — Inpatient Hospital Stay (HOSPITAL_COMMUNITY): Payer: Medicare Other | Admitting: Certified Registered"

## 2018-06-07 ENCOUNTER — Encounter (HOSPITAL_COMMUNITY): Payer: Self-pay

## 2018-06-07 ENCOUNTER — Encounter (HOSPITAL_COMMUNITY)
Admission: EM | Disposition: A | Discharge status: Home / Self Care | Payer: Self-pay | Source: Home / Self Care | Attending: Internal Medicine

## 2018-06-07 HISTORY — PX: AV FISTULA PLACEMENT: SHX1204

## 2018-06-07 LAB — CBC
HEMATOCRIT: 26.5 % — AB (ref 36.0–46.0)
Hemoglobin: 8.1 g/dL — ABNORMAL LOW (ref 12.0–15.0)
MCH: 25.2 pg — AB (ref 26.0–34.0)
MCHC: 30.6 g/dL (ref 30.0–36.0)
MCV: 82.6 fL (ref 78.0–100.0)
Platelets: 268 10*3/uL (ref 150–400)
RBC: 3.21 MIL/uL — ABNORMAL LOW (ref 3.87–5.11)
RDW: 15.4 % (ref 11.5–15.5)
WBC: 8.9 10*3/uL (ref 4.0–10.5)

## 2018-06-07 LAB — BASIC METABOLIC PANEL
Anion gap: 13 (ref 5–15)
BUN: 46 mg/dL — AB (ref 6–20)
CO2: 17 mmol/L — AB (ref 22–32)
Calcium: 8.5 mg/dL — ABNORMAL LOW (ref 8.9–10.3)
Chloride: 105 mmol/L (ref 98–111)
Creatinine, Ser: 4.23 mg/dL — ABNORMAL HIGH (ref 0.44–1.00)
GFR calc Af Amer: 13 mL/min — ABNORMAL LOW (ref >60)
GFR calc non Af Amer: 11 mL/min — ABNORMAL LOW (ref >60)
GLUCOSE: 163 mg/dL — AB (ref 70–99)
POTASSIUM: 4.2 mmol/L (ref 3.5–5.1)
Sodium: 135 mmol/L (ref 135–145)

## 2018-06-07 LAB — GLUCOSE, CAPILLARY
GLUCOSE-CAPILLARY: 67 mg/dL — AB (ref 70–99)
Glucose-Capillary: 110 mg/dL — ABNORMAL HIGH (ref 70–99)
Glucose-Capillary: 85 mg/dL (ref 70–99)
Glucose-Capillary: 312 mg/dL — ABNORMAL HIGH (ref 70–99)
Glucose-Capillary: 287 mg/dL — ABNORMAL HIGH (ref 70–99)

## 2018-06-07 LAB — LIPID PANEL
CHOLESTEROL: 267 mg/dL — AB (ref 0–200)
HDL: 53 mg/dL (ref >40)
LDL Cholesterol: 154 mg/dL — ABNORMAL HIGH (ref 0–99)
Total CHOL/HDL Ratio: 5 RATIO
Triglycerides: 301 mg/dL — ABNORMAL HIGH (ref <150)
VLDL: 60 mg/dL — AB (ref 0–40)

## 2018-06-07 LAB — SURGICAL PCR SCREEN
MRSA, PCR: NEGATIVE
STAPHYLOCOCCUS AUREUS: POSITIVE — AB

## 2018-06-07 LAB — HEMOGLOBIN AND HEMATOCRIT, BLOOD
HEMATOCRIT: 30 % — AB (ref 36.0–46.0)
HEMOGLOBIN: 9.2 g/dL — AB (ref 12.0–15.0)

## 2018-06-07 LAB — PROTIME-INR
INR: 1.07
Prothrombin Time: 13.8 seconds (ref 11.4–15.2)

## 2018-06-07 SURGERY — ARTERIOVENOUS (AV) FISTULA CREATION
Anesthesia: Monitor Anesthesia Care | Site: Arm Lower | Laterality: Right

## 2018-06-07 MED ORDER — AMLODIPINE BESYLATE 10 MG PO TABS
10.0000 mg | ORAL_TABLET | Freq: Every day | ORAL | Status: DC
  Administered 2018-06-08: 10 mg via ORAL
  Filled 2018-06-07: qty 1

## 2018-06-07 MED ORDER — CHLORHEXIDINE GLUCONATE CLOTH 2 % EX PADS: 6.0000 | MEDICATED_PAD | Freq: Every day | CUTANEOUS | Status: DC

## 2018-06-07 MED ORDER — SODIUM CHLORIDE 0.9 % IV SOLN
INTRAVENOUS | Status: DC
  Administered 2018-06-07: 09:00:00 via INTRAVENOUS

## 2018-06-07 MED ORDER — LIDOCAINE 2% (20 MG/ML) 5 ML SYRINGE
INTRAMUSCULAR | Status: DC | PRN
  Administered 2018-06-07: 40 mg via INTRAVENOUS

## 2018-06-07 MED ORDER — CHOLECALCIFEROL 1.25 MG (50000 UT) PO TABS: 1.0000 | ORAL_TABLET | ORAL | 0 refills | Status: AC

## 2018-06-07 MED ORDER — PROPOFOL 10 MG/ML IV BOLUS
INTRAVENOUS | Status: AC
  Filled 2018-06-07: qty 20

## 2018-06-07 MED ORDER — MEPERIDINE HCL 50 MG/ML IJ SOLN: 6.2500 mg | INTRAMUSCULAR | Status: DC | PRN

## 2018-06-07 MED ORDER — 0.9 % SODIUM CHLORIDE (POUR BTL) OPTIME
TOPICAL | Status: DC | PRN
  Administered 2018-06-07: 1000 mL

## 2018-06-07 MED ORDER — ONDANSETRON HCL 4 MG/2ML IJ SOLN
INTRAMUSCULAR | Status: AC
  Filled 2018-06-07: qty 2

## 2018-06-07 MED ORDER — SODIUM CHLORIDE 0.9 % IV SOLN
INTRAVENOUS | Status: AC
  Filled 2018-06-07: qty 1.2

## 2018-06-07 MED ORDER — PROMETHAZINE HCL 25 MG/ML IJ SOLN: 6.2500 mg | INTRAMUSCULAR | Status: DC | PRN

## 2018-06-07 MED ORDER — DEXAMETHASONE SODIUM PHOSPHATE 10 MG/ML IJ SOLN
INTRAMUSCULAR | Status: AC
  Filled 2018-06-07: qty 1

## 2018-06-07 MED ORDER — LIDOCAINE-EPINEPHRINE (PF) 1 %-1:200000 IJ SOLN
INTRAMUSCULAR | Status: DC | PRN
  Administered 2018-06-07: 6 mL via INTRADERMAL

## 2018-06-07 MED ORDER — FUROSEMIDE 20 MG PO TABS: 40.0000 mg | ORAL_TABLET | Freq: Every day | ORAL | 0 refills | Status: DC

## 2018-06-07 MED ORDER — ASPIRIN EC 81 MG PO TBEC
81.0000 mg | DELAYED_RELEASE_TABLET | Freq: Every day | ORAL | Status: DC
  Administered 2018-06-08: 81 mg via ORAL
  Filled 2018-06-07: qty 1

## 2018-06-07 MED ORDER — MUPIROCIN 2 % EX OINT: 1.0000 "application " | TOPICAL_OINTMENT | Freq: Two times a day (BID) | CUTANEOUS | Status: DC

## 2018-06-07 MED ORDER — SODIUM CHLORIDE 0.9 % IV SOLN
INTRAVENOUS | Status: DC | PRN
  Administered 2018-06-07: 40 ug/min via INTRAVENOUS

## 2018-06-07 MED ORDER — LIDOCAINE 2% (20 MG/ML) 5 ML SYRINGE
INTRAMUSCULAR | Status: AC
  Filled 2018-06-07: qty 5

## 2018-06-07 MED ORDER — FENTANYL CITRATE (PF) 100 MCG/2ML IJ SOLN
INTRAMUSCULAR | Status: DC | PRN
  Administered 2018-06-07: 25 ug via INTRAVENOUS
  Administered 2018-06-07: 50 ug via INTRAVENOUS

## 2018-06-07 MED ORDER — ASPIRIN EC 81 MG PO TBEC: 81.0000 mg | DELAYED_RELEASE_TABLET | Freq: Every day | ORAL | 0 refills | Status: AC

## 2018-06-07 MED ORDER — PROPOFOL 1000 MG/100ML IV EMUL
INTRAVENOUS | Status: AC
  Filled 2018-06-07: qty 100

## 2018-06-07 MED ORDER — METOPROLOL TARTRATE 50 MG PO TABS: 50.0000 mg | ORAL_TABLET | Freq: Two times a day (BID) | ORAL | 0 refills | Status: DC

## 2018-06-07 MED ORDER — FENTANYL CITRATE (PF) 100 MCG/2ML IJ SOLN: 25.0000 ug | INTRAMUSCULAR | Status: DC | PRN

## 2018-06-07 MED ORDER — LIDOCAINE-EPINEPHRINE (PF) 1 %-1:200000 IJ SOLN
INTRAMUSCULAR | Status: AC
  Filled 2018-06-07: qty 30

## 2018-06-07 MED ORDER — PROPOFOL 500 MG/50ML IV EMUL
INTRAVENOUS | Status: DC | PRN
  Administered 2018-06-07: 100 ug/kg/min via INTRAVENOUS

## 2018-06-07 MED ORDER — EPHEDRINE SULFATE-NACL 50-0.9 MG/10ML-% IV SOSY
PREFILLED_SYRINGE | INTRAVENOUS | Status: DC | PRN
  Administered 2018-06-07: 5 mg via INTRAVENOUS
  Administered 2018-06-07: 10 mg via INTRAVENOUS
  Administered 2018-06-07: 15 mg via INTRAVENOUS

## 2018-06-07 MED ORDER — ATORVASTATIN CALCIUM 40 MG PO TABS
40.0000 mg | ORAL_TABLET | Freq: Every day | ORAL | Status: DC
  Administered 2018-06-07: 40 mg via ORAL
  Filled 2018-06-07: qty 1

## 2018-06-07 MED ORDER — INSULIN GLARGINE 100 UNIT/ML ~~LOC~~ SOLN
50.0000 [IU] | Freq: Every day | SUBCUTANEOUS | Status: DC
  Administered 2018-06-07: 50 [IU] via SUBCUTANEOUS
  Filled 2018-06-07: qty 0.5

## 2018-06-07 MED ORDER — CEFAZOLIN SODIUM 1 G IJ SOLR
INTRAMUSCULAR | Status: AC
  Filled 2018-06-07: qty 20

## 2018-06-07 MED ORDER — SODIUM CHLORIDE 0.9 % IV SOLN
INTRAVENOUS | Status: DC | PRN
  Administered 2018-06-07: 500 mL

## 2018-06-07 MED ORDER — FENTANYL CITRATE (PF) 250 MCG/5ML IJ SOLN
INTRAMUSCULAR | Status: AC
  Filled 2018-06-07: qty 5

## 2018-06-07 MED ORDER — LEVETIRACETAM 500 MG PO TABS: 500.0000 mg | ORAL_TABLET | Freq: Two times a day (BID) | ORAL | 0 refills | Status: DC

## 2018-06-07 MED ORDER — MUPIROCIN 2 % EX OINT
1.0000 "application " | TOPICAL_OINTMENT | Freq: Two times a day (BID) | CUTANEOUS | Status: DC
  Administered 2018-06-07: 1 via NASAL
  Filled 2018-06-07: qty 22

## 2018-06-07 MED ORDER — PHENYLEPHRINE 40 MCG/ML (10ML) SYRINGE FOR IV PUSH (FOR BLOOD PRESSURE SUPPORT)
PREFILLED_SYRINGE | INTRAVENOUS | Status: DC | PRN
  Administered 2018-06-07: 80 ug via INTRAVENOUS

## 2018-06-07 MED ORDER — AMLODIPINE BESYLATE 10 MG PO TABS: 10.0000 mg | ORAL_TABLET | Freq: Every day | ORAL | 0 refills | Status: DC

## 2018-06-07 MED ORDER — MIDAZOLAM HCL 2 MG/2ML IJ SOLN
INTRAMUSCULAR | Status: AC
  Filled 2018-06-07: qty 2

## 2018-06-07 MED ORDER — PHENYLEPHRINE 40 MCG/ML (10ML) SYRINGE FOR IV PUSH (FOR BLOOD PRESSURE SUPPORT)
PREFILLED_SYRINGE | INTRAVENOUS | Status: AC
  Filled 2018-06-07: qty 20

## 2018-06-07 MED ORDER — GABAPENTIN 100 MG PO CAPS: 100.0000 mg | ORAL_CAPSULE | Freq: Every day | ORAL | 0 refills | Status: DC

## 2018-06-07 MED ORDER — ONDANSETRON HCL 4 MG/2ML IJ SOLN
INTRAMUSCULAR | Status: DC | PRN
  Administered 2018-06-07: 4 mg via INTRAVENOUS

## 2018-06-07 MED ORDER — FERROUS FUMARATE 324 (106 FE) MG PO TABS: 1.0000 | ORAL_TABLET | Freq: Every day | ORAL | 0 refills | Status: DC

## 2018-06-07 MED ORDER — PROPOFOL 10 MG/ML IV BOLUS
INTRAVENOUS | Status: DC | PRN
  Administered 2018-06-07: 20 mg via INTRAVENOUS
  Administered 2018-06-07: 10 mg via INTRAVENOUS

## 2018-06-07 MED ORDER — VANCOMYCIN HCL 10 G IV SOLR
1500.0000 mg | INTRAVENOUS | Status: AC
  Administered 2018-06-07: 1500 mg via INTRAVENOUS
  Filled 2018-06-07 (×2): qty 1500

## 2018-06-07 MED ORDER — DEXAMETHASONE SODIUM PHOSPHATE 4 MG/ML IJ SOLN
INTRAMUSCULAR | Status: DC | PRN
  Administered 2018-06-07: 4 mg via INTRAVENOUS

## 2018-06-07 MED ORDER — DEXTROSE 50 % IV SOLN
INTRAVENOUS | Status: AC
  Administered 2018-06-07: 50 mL
  Filled 2018-06-07: qty 50

## 2018-06-07 MED ORDER — ATORVASTATIN CALCIUM 40 MG PO TABS: 40.0000 mg | ORAL_TABLET | Freq: Every day | ORAL | 0 refills | Status: DC

## 2018-06-07 MED ORDER — MIDAZOLAM HCL 5 MG/5ML IJ SOLN
INTRAMUSCULAR | Status: DC | PRN
  Administered 2018-06-07: 1 mg via INTRAVENOUS

## 2018-06-07 SURGICAL SUPPLY — 35 items
ARMBAND PINK RESTRICT EXTREMIT (MISCELLANEOUS) ×4 IMPLANT
CANISTER SUCT 3000ML PPV (MISCELLANEOUS) ×3 IMPLANT
CATH EMB 4FR 40CM (CATHETERS) ×2 IMPLANT
CLIP VESOCCLUDE MED 6/CT (CLIP) ×3 IMPLANT
CLIP VESOCCLUDE SM WIDE 6/CT (CLIP) ×3 IMPLANT
COVER PROBE W GEL 5X96 (DRAPES) ×3 IMPLANT
DECANTER SPIKE VIAL GLASS SM (MISCELLANEOUS) ×2 IMPLANT
DERMABOND ADHESIVE PROPEN (GAUZE/BANDAGES/DRESSINGS) ×2
DERMABOND ADVANCED (GAUZE/BANDAGES/DRESSINGS) ×2
DERMABOND ADVANCED .7 DNX12 (GAUZE/BANDAGES/DRESSINGS) ×1 IMPLANT
DERMABOND ADVANCED .7 DNX6 (GAUZE/BANDAGES/DRESSINGS) IMPLANT
ELECT REM PT RETURN 9FT ADLT (ELECTROSURGICAL) ×3
ELECTRODE REM PT RTRN 9FT ADLT (ELECTROSURGICAL) ×1 IMPLANT
GLOVE BIOGEL PI IND STRL 7.5 (GLOVE) ×1 IMPLANT
GLOVE BIOGEL PI INDICATOR 7.5 (GLOVE) ×2
GLOVE SURG SS PI 7.5 STRL IVOR (GLOVE) ×3 IMPLANT
GOWN STRL REUS W/ TWL LRG LVL3 (GOWN DISPOSABLE) ×2 IMPLANT
GOWN STRL REUS W/ TWL XL LVL3 (GOWN DISPOSABLE) ×1 IMPLANT
GOWN STRL REUS W/TWL LRG LVL3 (GOWN DISPOSABLE) ×4
GOWN STRL REUS W/TWL XL LVL3 (GOWN DISPOSABLE) ×2
HEMOSTAT SNOW SURGICEL 2X4 (HEMOSTASIS) IMPLANT
KIT BASIN OR (CUSTOM PROCEDURE TRAY) ×3 IMPLANT
KIT TURNOVER KIT B (KITS) ×3 IMPLANT
NS IRRIG 1000ML POUR BTL (IV SOLUTION) ×3 IMPLANT
PACK CV ACCESS (CUSTOM PROCEDURE TRAY) ×3 IMPLANT
PAD ARMBOARD 7.5X6 YLW CONV (MISCELLANEOUS) ×6 IMPLANT
SUT MNCRL AB 4-0 PS2 18 (SUTURE) ×2 IMPLANT
SUT PROLENE 6 0 CC (SUTURE) ×3 IMPLANT
SUT VIC AB 3-0 SH 27 (SUTURE) ×2
SUT VIC AB 3-0 SH 27X BRD (SUTURE) ×1 IMPLANT
SUT VICRYL 4-0 PS2 18IN ABS (SUTURE) ×3 IMPLANT
SYR 3ML LL SCALE MARK (SYRINGE) ×2 IMPLANT
TOWEL GREEN STERILE (TOWEL DISPOSABLE) ×3 IMPLANT
UNDERPAD 30X30 (UNDERPADS AND DIAPERS) ×3 IMPLANT
WATER STERILE IRR 1000ML POUR (IV SOLUTION) ×1 IMPLANT
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
## 2018-06-07 NOTE — Anesthesia Postprocedure Evaluation (Signed)
Anesthesia Post Note  Patient: Amber Lara  Procedure(s) Performed: CREATION OF RIGHT BRACHIOCEPHALIC ARTERIOVENOUS FISTULA (Right Arm Lower)     Patient location during evaluation: PACU Anesthesia Type: MAC Level of consciousness: awake and alert Pain management: pain level controlled Vital Signs Assessment: post-procedure vital signs reviewed and stable Respiratory status: spontaneous breathing Cardiovascular status: stable Anesthetic complications: no    Last Vitals:  Vitals:   06/07/18 1203 06/07/18 1236  BP: (!) 156/74 (!) 185/87  Pulse:  65  Resp:    Temp:  36.5 C  SpO2:  100%    Last Pain:  Vitals:   06/07/18 1236  TempSrc: Oral  PainSc:                  Nolon Nations

## 2018-06-07 NOTE — Interval H&P Note (Signed)
History and Physical Interval Note:  06/07/2018 9:11 AM  Amber Lara  has presented today for surgery, with the diagnosis of END STAGE RENAL DISEASE FOR HEMODIALYSIS ACCESS  The various methods of treatment have been discussed with the patient and family. After consideration of risks, benefits and other options for treatment, the patient has consented to  Procedure(s): CREATION OF RIGHT BRACHIOCEPHALIC ARTERIOVENOUS FISTULA (Right) as a surgical intervention .  The patient's history has been reviewed, patient examined, no change in status, stable for surgery.  I have reviewed the patient's chart and labs.  Questions were answered to the patient's satisfaction.     [name]

## 2018-06-07 NOTE — Progress Notes (Signed)
Orthopedic Tech Progress Note Patient Details:  Amber Lara [birth year] [id number]  Ortho Devices Type of Ortho Device: Sling immobilizer   Post Interventions Instructions Provided: Care of device   [name] 06/07/2018, 6:45 PM

## 2018-06-07 NOTE — Transfer of Care (Signed)
Immediate Anesthesia Transfer of Care Note  Patient: Amber Lara  Procedure(s) Performed: CREATION OF RIGHT BRACHIOCEPHALIC ARTERIOVENOUS FISTULA (Right Arm Lower)  Patient Location: PACU  Anesthesia Type:MAC  Level of Consciousness: awake and patient cooperative  Airway & Oxygen Therapy: Patient Spontanous Breathing and Patient connected to nasal cannula oxygen  Post-op Assessment: Report given to RN and Post -op Vital signs reviewed and stable  Post vital signs: Reviewed and stable  Last Vitals:  Vitals Value Taken Time  BP    Temp 36.5 C 06/07/2018 11:58 AM  Pulse 61 06/07/2018 11:54 AM  Resp 16 06/07/2018 11:54 AM  SpO2 100 % 06/07/2018 11:54 AM    Last Pain:  Vitals:   06/07/18 1130  TempSrc:   PainSc: 0-No pain      Patients Stated Pain Goal: 0 (25/36/64 4034)  Complications: No apparent anesthesia complications

## 2018-06-07 NOTE — Anesthesia Preprocedure Evaluation (Addendum)
Anesthesia Evaluation  Patient identified by MRN, date of birth, ID band Patient awake    Reviewed: Allergy & Precautions, NPO status , Patient's Chart, lab work & pertinent test results  Airway Mallampati: III  TM Distance: >3 FB Neck ROM: Full    Dental  (+) Dental Advisory Given   Pulmonary former smoker,    Pulmonary exam normal breath sounds clear to auscultation       Cardiovascular hypertension, Pt. on medications Normal cardiovascular exam Rhythm:Regular Rate:Normal     Neuro/Psych Seizures -,  PSYCHIATRIC DISORDERS Schizophrenia    GI/Hepatic negative GI ROS, Neg liver ROS,   Endo/Other  diabetes, Type 2Morbid obesity  Renal/GU ESRF and Renal InsufficiencyRenal disease     Musculoskeletal negative musculoskeletal ROS (+)   Abdominal   Peds  Hematology  (+) anemia ,   Anesthesia Other Findings   Reproductive/Obstetrics negative OB ROS                            Anesthesia Physical Anesthesia Plan  ASA: III  Anesthesia Plan: MAC   Post-op Pain Management:    Induction: Intravenous  PONV Risk Score and Plan: 2 and Ondansetron and Treatment may vary due to age or medical condition  Airway Management Planned:   Additional Equipment:   Intra-op Plan:   Post-operative Plan: Extubation in OR  Informed Consent: I have reviewed the patients History and Physical, chart, labs and discussed the procedure including the risks, benefits and alternatives for the proposed anesthesia with the patient or authorized representative who has indicated his/her understanding and acceptance.   Dental advisory given  Plan Discussed with: CRNA  Anesthesia Plan Comments:         Anesthesia Quick Evaluation

## 2018-06-07 NOTE — Op Note (Signed)
    Patient name: Amber Lara MRN: [id number] DOB: 1961/05/07 Sex: female  06/07/2018 Pre-operative Diagnosis: CKD Post-operative diagnosis:  Same Surgeon:  [name] Assistants: Laurence Slate Procedure:   #1: Right brachiocephalic fistula   #2: Thrombectomy of right cephalic vein Anesthesia: MAC Blood Loss: Minimal Specimens: None  Findings: There was subacute thrombus within the cephalic vein which was able to be removed with a Fogarty catheter.  The vein was approximately 4 mm.  She had a healthy appearing brachial artery, 3.5 mm.  Indications: Permanent access has been requested for the patient who is left-handed.  Vein mapping revealed an adequate right cephalic vein.  She did have a IV in her right antecubital crease which was removed the day before procedure.  Procedure:  The patient was identified in the holding area and taken to [facility] [address]  The patient was then placed supine on the table. MAC anesthesia was administered.  The patient was prepped and draped in the usual sterile fashion.  A time out was called and antibiotics were administered.  Ultrasound was used to evaluate the cephalic vein in the upper arm.  This did appear to be of adequate caliber throughout the upper arm.  1% lidocaine was used for local anesthesia.  A transverse incision was made at the antecubital crease.  I first dissected out the brachial artery which was a disease-free 3.5 mm artery.  Attention was then turned towards the cephalic vein.  This was fully mobilized, ligating side branches.  The vein measured approximately 4 mm.  It was marked for orientation and then ligated distally.  There was subacute thrombus within the vein which I removed with DeBakey forceps.  I then passed a #4 Fogarty catheter up the cephalic vein and removed additional thrombus and got good backbleeding.  Next, the brachial artery was occluded with vascular clamps.  A #11 blade was used to make an arteriotomy which was extended  longitudinally with Potts scissors.  The vein was then cut the appropriate length and spatulated to fit the size of the arteriotomy.  A running anastomosis was created with 6-0 Prolene.  Prior to completion, the appropriate flushing maneuvers were performed and the anastomosis was completed.  Patient had a good thrill within her fistula and brisk Doppler signals in the radial and ulnar artery.  I made sure that there were no kinks within the course of the vein.  Hemostasis was then achieved and the incision was closed with a deep layer 3-0 Vicryl and then 4-0 Monocryl on the skin.  Dermabond was applied.  There were no immediate complications.   Disposition: To PACU stable   V. [name], M.D. [facility] Office: [phone] Pager:  [phone]

## 2018-06-07 NOTE — Anesthesia Procedure Notes (Signed)
Procedure Name: MAC Date/Time: 06/07/2018 10:20 AM Performed by: Orlie Dakin, CRNA Pre-anesthesia Checklist: Patient identified, Emergency Drugs available, Suction available and Patient being monitored Patient Re-evaluated:Patient Re-evaluated prior to induction Oxygen Delivery Method: Simple face mask Preoxygenation: Pre-oxygenation with 100% oxygen Induction Type: IV induction Ventilation: Oral airway inserted - appropriate to patient size

## 2018-06-08 ENCOUNTER — Telehealth: Payer: Self-pay | Admitting: Surgery

## 2018-06-08 ENCOUNTER — Encounter (HOSPITAL_COMMUNITY): Payer: Self-pay | Admitting: Surgery

## 2018-06-08 ENCOUNTER — Other Ambulatory Visit: Payer: Self-pay

## 2018-06-08 DIAGNOSIS — E1165 Type 2 diabetes mellitus with hyperglycemia: Secondary | ICD-10-CM

## 2018-06-08 DIAGNOSIS — N170 Acute kidney failure with tubular necrosis: Secondary | ICD-10-CM

## 2018-06-08 DIAGNOSIS — N185 Chronic kidney disease, stage 5: Secondary | ICD-10-CM

## 2018-06-08 DIAGNOSIS — Z794 Long term (current) use of insulin: Secondary | ICD-10-CM

## 2018-06-08 LAB — BASIC METABOLIC PANEL
Anion gap: 10 (ref 5–15)
BUN: 54 mg/dL — AB (ref 6–20)
CALCIUM: 9.3 mg/dL (ref 8.9–10.3)
CO2: 20 mmol/L — AB (ref 22–32)
Chloride: 102 mmol/L (ref 98–111)
Creatinine, Ser: 4.32 mg/dL — ABNORMAL HIGH (ref 0.44–1.00)
GFR calc Af Amer: 12 mL/min — ABNORMAL LOW (ref >60)
GFR, EST NON AFRICAN AMERICAN: 11 mL/min — AB (ref >60)
GLUCOSE: 177 mg/dL — AB (ref 70–99)
POTASSIUM: 5.1 mmol/L (ref 3.5–5.1)
Sodium: 132 mmol/L — ABNORMAL LOW (ref 135–145)

## 2018-06-08 LAB — CBC
HEMATOCRIT: 30.9 % — AB (ref 36.0–46.0)
Hemoglobin: 9.7 g/dL — ABNORMAL LOW (ref 12.0–15.0)
MCH: 25.5 pg — AB (ref 26.0–34.0)
MCHC: 31.4 g/dL (ref 30.0–36.0)
MCV: 81.1 fL (ref 78.0–100.0)
PLATELETS: 323 10*3/uL (ref 150–400)
RBC: 3.81 MIL/uL — ABNORMAL LOW (ref 3.87–5.11)
RDW: 15.6 % — AB (ref 11.5–15.5)
WBC: 15.4 10*3/uL — ABNORMAL HIGH (ref 4.0–10.5)

## 2018-06-08 LAB — MAGNESIUM: Magnesium: 2.7 mg/dL — ABNORMAL HIGH (ref 1.7–2.4)

## 2018-06-08 LAB — GLUCOSE, CAPILLARY: Glucose-Capillary: 153 mg/dL — ABNORMAL HIGH (ref 70–99)

## 2018-06-08 MED ORDER — INSULIN GLARGINE 100 UNIT/ML ~~LOC~~ SOLN: 50.0000 [IU] | Freq: Every day | SUBCUTANEOUS | 0 refills | Status: DC

## 2018-06-08 MED ORDER — AMLODIPINE BESYLATE 10 MG PO TABS: 10.0000 mg | ORAL_TABLET | Freq: Every day | ORAL | 0 refills | Status: DC

## 2018-06-08 NOTE — Discharge Instructions (Signed)
Preventing Hypertension °Hypertension, commonly called high blood pressure, is when the force of blood pumping through the arteries is too strong. Arteries are blood vessels that carry blood from the heart throughout the body. Over time, hypertension can damage the arteries and decrease blood flow to important parts of the body, including the brain, heart, and kidneys. Often, hypertension does not cause symptoms until blood pressure is very high. For this reason, it is important to have your blood pressure checked on a regular basis. °Hypertension can often be prevented with diet and lifestyle changes. If you already have hypertension, you can control it with diet and lifestyle changes, as well as medicine. °What nutrition changes can be made? °Maintain a healthy diet. This includes: °· Eating less salt (sodium). Ask your health care provider how much sodium is safe for you to have. The general recommendation is to consume less than 1 tsp (2,300 mg) of sodium a day. °? Do not add salt to your food. °? Choose low-sodium options when grocery shopping and eating out. °· Limiting fats in your diet. You can do this by eating low-fat or fat-free dairy products and by eating less red meat. °· Eating more fruits, vegetables, and whole grains. Make a goal to eat: °? 1½-2 cups of fresh fruits and vegetables each day. °? 3-4 servings of whole grains each day. °· Avoiding foods and beverages that have added sugars. °· Eating fish that contain healthy fats (omega-3 fatty acids), such as mackerel or salmon. ° °If you need help putting together a healthy eating plan, try the DASH diet. This diet is high in fruits, vegetables, and whole grains. It is low in sodium, red meat, and added sugars. DASH stands for Dietary Approaches to Stop Hypertension. °What lifestyle changes can be made? °· Lose weight if you are overweight. Losing just 3?5% of your body weight can help prevent or control hypertension. °? For example, if your present  weight is 200 lb (91 kg), a loss of 3-5% of your weight means losing 6-10 lb (2.7-4.5 kg). °? Ask your health care provider to help you with a diet and exercise plan to safely lose weight. °· Get enough exercise. Do at least 150 minutes of moderate-intensity exercise each week. °? You could do this in short exercise sessions several times a day, or you could do longer exercise sessions a few times a week. For example, you could take a brisk 10-minute walk or bike ride, 3 times a day, for 5 days a week. °· Find ways to reduce stress, such as exercising, meditating, listening to music, or taking a yoga class. If you need help reducing stress, ask your health care provider. °· Do not smoke. This includes e-cigarettes. Chemicals in tobacco and nicotine products raise your blood pressure each time you smoke. If you need help quitting, ask your health care provider. °· Avoid alcohol. If you drink alcohol, limit alcohol intake to no more than 1 drink a day for nonpregnant women and 2 drinks a day for men. One drink equals 12 oz of beer, 5 oz of wine, or 1½ oz of hard liquor. °Why are these changes important? °Diet and lifestyle changes can help you prevent hypertension, and they may make you feel better overall and improve your quality of life. If you have hypertension, making these changes will help you control it and help prevent major complications, such as: °· Hardening and narrowing of arteries that supply blood to: °? Your heart. This can cause a heart   attack. ? Your brain. This can cause a stroke. ? Your kidneys. This can cause kidney failure.  Stress on your heart muscle, which can cause heart failure.  What can I do to lower my risk?  Work with your health care provider to make a hypertension prevention plan that works for you. Follow your plan and keep all follow-up visits as told by your health care provider.  Learn how to check your blood pressure at home. Make sure that you know your personal target  blood pressure, as told by your health care provider. How is this treated? In addition to diet and lifestyle changes, your health care provider may recommend medicines to help lower your blood pressure. You may need to try a few different medicines to find what works best for you. You also may need to take more than one medicine. Take over-the-counter and prescription medicines only as told by your health care provider. Where to find support: Your health care provider can help you prevent hypertension and help you keep your blood pressure at a healthy level. Your local hospital or your community may also provide support services and prevention programs. The American Heart Association offers an online support network at: CheapBootlegs.com.cy Where to find more information: Learn more about hypertension from:  National Heart, Lung, and Blood Institute: ElectronicHangman.is  Centers for Disease Control and Prevention: https://ingram.com/  American Academy of Family Physicians: http://familydoctor.org/familydoctor/en/diseases-conditions/high-blood-pressure.printerview.all.html  Learn more about the DASH diet from:  [place], [facility], and [place]: https://www.reyes.com/  Contact a health care provider if:  You think you are having a reaction to medicines you have taken.  You have recurrent headaches or feel dizzy.  You have swelling in your ankles.  You have trouble with your vision. Summary  Hypertension often does not cause any symptoms until blood pressure is very high. It is important to get your blood pressure checked regularly.  Diet and lifestyle changes are the most important steps in preventing hypertension.  By keeping your blood pressure in a healthy range, you can prevent complications like heart attack, heart failure, stroke, and kidney failure.  Work with your health  care provider to make a hypertension prevention plan that works for you. This information is not intended to replace advice given to you by your health care provider. Make sure you discuss any questions you have with your health care provider. Document Released: 10/11/2015 Document Revised: 06/06/2016 Document Reviewed: 06/06/2016 Elsevier Interactive Patient Education  2018 [employer].   [facility]  Discharge Instructions  AV Fistula or Graft Surgery for Dialysis Access  Please refer to the following instructions for your post-procedure care. Your surgeon or physician assistant will discuss any changes with you.  Activity  You may drive the day following your surgery, if you are comfortable and no longer taking prescription pain medication. Resume full activity as the soreness in your incision resolves.  Bathing/Showering  You may shower after you go home. Keep your incision dry for 48 hours. Do not soak in a bathtub, hot tub, or swim until the incision heals completely. You may not shower if you have a hemodialysis catheter.  Incision Care  Clean your incision with mild soap and water after 48 hours. Pat the area dry with a clean towel. You do not need a bandage unless otherwise instructed. Do not apply any ointments or creams to your incision. You may have skin glue on your incision. Do not peel it off. It will come off  on its own in about one week. Your arm may swell a bit after surgery. To reduce swelling use pillows to elevate your arm so it is above your heart. Your doctor will tell you if you need to lightly wrap your arm with an ACE bandage.  Diet  Resume your normal diet. There are not special food restrictions following this procedure. In order to heal from your surgery, it is CRITICAL to get adequate nutrition. Your body requires vitamins, minerals, and protein. Vegetables are the best source of vitamins and minerals. Vegetables also provide  the perfect balance of protein. Processed food has little nutritional value, so try to avoid this.  Medications  Resume taking all of your medications. If your incision is causing pain, you may take over-the counter pain relievers such as acetaminophen (Tylenol). If you were prescribed a stronger pain medication, please be aware these medications can cause nausea and constipation. Prevent nausea by taking the medication with a snack or meal. Avoid constipation by drinking plenty of fluids and eating foods with high amount of fiber, such as fruits, vegetables, and grains. Do not take Tylenol if you are taking prescription pain medications.     Follow up Your surgeon may want to see you in the office following your access surgery. If so, this will be arranged at the time of your surgery.  Please call us immediately for any of the following conditions:  Increased pain, redness, drainage (pus) from your incision site Fever of 101 degrees or higher Severe or worsening pain at your incision site Hand pain or numbness.  Reduce your risk of vascular disease:  Stop smoking. If you would like help, call [facility] at 1-800-QUIT-NOW (640)655-0394) or [place] at [place] your cholesterol Maintain a desired weight Control your diabetes Keep your blood pressure down  Dialysis  It will take several weeks to several months for your new dialysis access to be ready for use. Your surgeon will determine when it is OK to use it. Your nephrologist will continue to direct your dialysis. You can continue to use your Permcath until your new access is ready for use.  If you have any questions, please call the office at [phone].

## 2018-06-08 NOTE — Telephone Encounter (Signed)
sch appt vm full mld ltr 07/16/18 10am Dialysis Duplex 11am p/o MD
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
## 2018-06-12 ENCOUNTER — Ambulatory Visit: Payer: Medicare Other | Admitting: Family Medicine

## 2018-07-15 ENCOUNTER — Encounter (HOSPITAL_COMMUNITY): Payer: Self-pay | Admitting: Emergency Medicine

## 2018-07-15 ENCOUNTER — Emergency Department (HOSPITAL_COMMUNITY): Payer: Medicare Other

## 2018-07-15 ENCOUNTER — Emergency Department (HOSPITAL_COMMUNITY)
Admission: EM | Admit: 2018-07-15 | Discharge: 2018-07-16 | Disposition: A | Discharge status: Home / Self Care | Payer: Medicare Other | Attending: Emergency Medicine | Admitting: Emergency Medicine

## 2018-07-15 DIAGNOSIS — K429 Umbilical hernia without obstruction or gangrene: Secondary | ICD-10-CM | POA: Diagnosis not present

## 2018-07-15 DIAGNOSIS — R1084 Generalized abdominal pain: Secondary | ICD-10-CM | POA: Insufficient documentation

## 2018-07-15 DIAGNOSIS — I10 Essential (primary) hypertension: Secondary | ICD-10-CM | POA: Diagnosis not present

## 2018-07-15 DIAGNOSIS — I120 Hypertensive chronic kidney disease with stage 5 chronic kidney disease or end stage renal disease: Secondary | ICD-10-CM | POA: Insufficient documentation

## 2018-07-15 DIAGNOSIS — F17210 Nicotine dependence, cigarettes, uncomplicated: Secondary | ICD-10-CM | POA: Insufficient documentation

## 2018-07-15 DIAGNOSIS — R197 Diarrhea, unspecified: Secondary | ICD-10-CM

## 2018-07-15 DIAGNOSIS — Z79899 Other long term (current) drug therapy: Secondary | ICD-10-CM | POA: Insufficient documentation

## 2018-07-15 DIAGNOSIS — N184 Chronic kidney disease, stage 4 (severe): Secondary | ICD-10-CM

## 2018-07-15 DIAGNOSIS — E1165 Type 2 diabetes mellitus with hyperglycemia: Secondary | ICD-10-CM | POA: Diagnosis not present

## 2018-07-15 DIAGNOSIS — Z794 Long term (current) use of insulin: Secondary | ICD-10-CM | POA: Insufficient documentation

## 2018-07-15 DIAGNOSIS — R1111 Vomiting without nausea: Secondary | ICD-10-CM | POA: Diagnosis not present

## 2018-07-15 DIAGNOSIS — K529 Noninfective gastroenteritis and colitis, unspecified: Secondary | ICD-10-CM

## 2018-07-15 DIAGNOSIS — R231 Pallor: Secondary | ICD-10-CM | POA: Diagnosis not present

## 2018-07-15 DIAGNOSIS — N185 Chronic kidney disease, stage 5: Secondary | ICD-10-CM | POA: Diagnosis not present

## 2018-07-15 DIAGNOSIS — E119 Type 2 diabetes mellitus without complications: Secondary | ICD-10-CM | POA: Insufficient documentation

## 2018-07-15 DIAGNOSIS — R112 Nausea with vomiting, unspecified: Secondary | ICD-10-CM

## 2018-07-15 LAB — CBC
HEMATOCRIT: 29.1 % — AB (ref 36.0–46.0)
Hemoglobin: 8.9 g/dL — ABNORMAL LOW (ref 12.0–15.0)
MCH: 25.9 pg — AB (ref 26.0–34.0)
MCHC: 30.6 g/dL (ref 30.0–36.0)
MCV: 84.8 fL (ref 78.0–100.0)
Platelets: 234 10*3/uL (ref 150–400)
RBC: 3.43 MIL/uL — AB (ref 3.87–5.11)
RDW: 15.7 % — ABNORMAL HIGH (ref 11.5–15.5)
WBC: 7.4 10*3/uL (ref 4.0–10.5)

## 2018-07-15 LAB — COMPREHENSIVE METABOLIC PANEL
ALBUMIN: 2.6 g/dL — AB (ref 3.5–5.0)
ALT: 12 U/L (ref 0–44)
ANION GAP: 8 (ref 5–15)
AST: 16 U/L (ref 15–41)
Alkaline Phosphatase: 88 U/L (ref 38–126)
BUN: 30 mg/dL — ABNORMAL HIGH (ref 6–20)
CO2: 21 mmol/L — AB (ref 22–32)
Calcium: 8.3 mg/dL — ABNORMAL LOW (ref 8.9–10.3)
Chloride: 101 mmol/L (ref 98–111)
Creatinine, Ser: 4.22 mg/dL — ABNORMAL HIGH (ref 0.44–1.00)
GFR calc Af Amer: 13 mL/min — ABNORMAL LOW (ref >60)
GFR calc non Af Amer: 11 mL/min — ABNORMAL LOW (ref >60)
GLUCOSE: 318 mg/dL — AB (ref 70–99)
POTASSIUM: 4.2 mmol/L (ref 3.5–5.1)
SODIUM: 130 mmol/L — AB (ref 135–145)
Total Bilirubin: 0.6 mg/dL (ref 0.3–1.2)
Total Protein: 5.8 g/dL — ABNORMAL LOW (ref 6.5–8.1)

## 2018-07-15 LAB — LIPASE, BLOOD: LIPASE: 29 U/L (ref 11–51)

## 2018-07-15 MED ORDER — PROMETHAZINE HCL 25 MG/ML IJ SOLN
25.0000 mg | Freq: Once | INTRAMUSCULAR | Status: AC
  Administered 2018-07-15: 25 mg via INTRAVENOUS
  Filled 2018-07-15: qty 1

## 2018-07-15 NOTE — ED Triage Notes (Signed)
Pt transported from home by [facility] for c/o n/v/d onset 1900 tonight. Per family emesis x 5 then once with EMS. Diarrhea 3 total times. Productive cough x 3 days. Denies pain. # 20 L AC, Zofran given. Maturing fistula R upper arm. Pt reports no one else in home sick.

## 2018-07-15 NOTE — ED Notes (Signed)
Pt returned from CT, no emesis or diarrhea noted.

## 2018-07-15 NOTE — ED Notes (Signed)
Pt to CT via stretcher

## 2018-07-15 NOTE — ED Notes (Signed)
Pt aware of need for urine Pt medicated per MD order.

## 2018-07-16 ENCOUNTER — Telehealth: Payer: Self-pay

## 2018-07-16 ENCOUNTER — Inpatient Hospital Stay (HOSPITAL_COMMUNITY): Admission: RE | Admit: 2018-07-16 | Payer: Medicare Other | Source: Ambulatory Visit

## 2018-07-16 ENCOUNTER — Encounter: Payer: Medicare Other | Admitting: Surgery

## 2018-07-16 MED ORDER — PROMETHAZINE HCL 25 MG PO TABS: 25.0000 mg | ORAL_TABLET | Freq: Four times a day (QID) | ORAL | 0 refills | Status: DC | PRN

## 2018-07-16 NOTE — Discharge Instructions (Signed)
Follow-up with your primary care doctor.  Return here as needed.  Slowly increase your fluid intake.

## 2018-07-16 NOTE — Telephone Encounter (Signed)
Returned patient call. She was calling to get a prescription for Gabapentin. Had an appt with [place] today but cancelled. Has never been seen in the office here. Encouraged to call internal medicine, who has been following her, to get prescription. Rescheduled her appt with [place] to November. Encouraged to keep appointment.

## 2018-07-16 NOTE — ED Notes (Signed)
Pt and friend verbalize understanding of d/c instructions. Pt assisted to vehicle by WC.

## 2018-07-16 NOTE — ED Provider Notes (Signed)
[facility] EMERGENCY DEPARTMENT Provider Note   CSN: [id number] Arrival date & time: 07/15/18  2027     History   Chief Complaint Chief Complaint  Patient presents with  . Emesis    HPI Amber Lara is a 57 y.o. female.  HPI Patient presents to the emergency department with nausea vomiting diarrhea that started around 6 this evening.  Patient states that she had 5-6 episodes of vomiting and 3 bowel movements or diarrhea.  The patient states that nothing seems to make the condition better or worse.  Patient states she was eating just prior to this happening.  The patient denies chest pain, shortness of breath, headache,blurred vision, neck pain, fever, cough, weakness, numbness, dizziness, anorexia, edema,  rash, back pain, dysuria, hematemesis, bloody stool, near syncope, or syncope. Past Medical History:  Diagnosis Date  . Chronic anemia   . CKD (chronic kidney disease), stage IV ([place])   . Diabetes mellitus ([place])   . Hypertension   . Psychosis ([place])    a. prior adm in [facility] ([place]) in 2015 where pt was admitted with hallucinations, seeing men in black outfits, family members on a non-existent camera, hearing voices and seeing animals - UDS positive for methamphetamines at that time and patient stated she believed someone had put this in her body.  . Seizures [facility])     Patient Active Problem List   Diagnosis Date Noted  . CKD (chronic kidney disease), stage IV ([place])   . CKD (chronic kidney disease), stage V ([place]) 06/04/2018  . Shoulder pain   . Hypertensive emergency 06/02/2018  . Acute on chronic renal failure ([place]) 08/03/2017  . Hyperglycemia 08/03/2017  . Seizure ([place]) 08/03/2017  . Chest pain 07/14/2017  . Hyperkalemia 07/14/2017  . Acute kidney injury superimposed on chronic kidney disease ([place]) 07/14/2017  . Type 2 diabetes mellitus with hyperglycemia ([place]) 06/23/2017  . Normochromic normocytic anemia 06/23/2017  . Essential  hypertension 06/23/2017  . Acute kidney injury superimposed on CKD ([place]) 06/23/2017    Past Surgical History:  Procedure Laterality Date  . ABDOMINAL HYSTERECTOMY    . AV FISTULA PLACEMENT Right 06/07/2018   Procedure: CREATION OF RIGHT BRACHIOCEPHALIC ARTERIOVENOUS FISTULA;  Surgeon: Serafina Mitchell, MD;  Location: [facility] OR;  Service: Vascular;  Laterality: Right;     OB History   None      Home Medications    Prior to Admission medications   Medication Sig Start Date End Date Taking? Authorizing Provider  acetaminophen (TYLENOL) 325 MG tablet Take 2 tablets (650 mg total) by mouth every 6 (six) hours as needed for mild pain or moderate pain (or Fever >/= 101). 07/15/17   Hongalgi, Lenis Dickinson, MD  amLODipine (NORVASC) 10 MG tablet Take 1 tablet (10 mg total) by mouth daily. 06/08/18 07/08/18  Dhungel, Flonnie Overman, MD  atorvastatin (LIPITOR) 40 MG tablet Take 1 tablet (40 mg total) by mouth daily at 6 PM. 06/07/18 07/07/18  Elodia Florence., MD  benztropine (COGENTIN) 2 MG tablet Take 2 mg by mouth 2 (two) times daily. 03/24/17   [provider]  Cholecalciferol 50000 units TABS Take 1 tablet by mouth once a week for 7 doses. (start on 06/13/18) 06/07/18 07/20/18  Elodia Florence., MD  diclofenac sodium (VOLTAREN) 1 % GEL Apply 2 g topically 4 (four) times daily. Apply to front of your mid chest. Use regularly for 3-5 days then use as needed. Patient not taking: Reported on 06/02/2018 07/15/17  Hongalgi, Lenis Dickinson, MD  docusate sodium (COLACE) 100 MG capsule Take 200 mg by mouth daily.    [provider]  Ferrous Fumarate (HEMOCYTE - 106 MG FE) 324 (106 Fe) MG TABS tablet Take 1 tablet (106 mg of iron total) by mouth daily. 06/07/18 07/07/18  Elodia Florence., MD  furosemide (LASIX) 20 MG tablet Take 2 tablets (40 mg total) by mouth daily. 06/07/18 07/07/18  Elodia Florence., MD  gabapentin (NEURONTIN) 100 MG capsule Take 1 capsule (100 mg total) by mouth at bedtime.  06/07/18 07/07/18  Elodia Florence., MD  insulin aspart (NOVOLOG) 100 UNIT/ML injection 8 units TID with meals Patient taking differently: Inject 0-20 Units into the skin 3 (three) times daily. Pt states her max is 20 units. Greater than  300- call doctor 08/07/17 08/07/18  Scot Jun, FNP  insulin glargine (LANTUS) 100 UNIT/ML injection Inject 0.5 mLs (50 Units total) into the skin at bedtime. 06/08/18 07/08/18  Dhungel, Nishant, MD  Insulin Pen Needle (NOVOFINE) 30G X 8 MM MISC Inject insulin into skin per units prescribed. 07/19/17   Scot Jun, FNP  Insulin Syringe-Needle U-100 (TRUEPLUS INSULIN SYRINGE) 31G X 5/16" 0.3 ML MISC Use as directed 07/19/17   Tresa Garter, MD  levETIRAcetam (KEPPRA) 500 MG tablet Take 1 tablet (500 mg total) by mouth 2 (two) times daily. 06/07/18 07/07/18  Elodia Florence., MD  metoprolol tartrate (LOPRESSOR) 50 MG tablet Take 1 tablet (50 mg total) by mouth 2 (two) times daily. 06/07/18 07/07/18  Elodia Florence., MD  Multiple Vitamin (MULTIVITAMIN WITH MINERALS) TABS tablet Take 1 tablet by mouth daily.    [provider]    Family History Family History  Problem Relation Age of Onset  . Diabetes Mellitus II Mother   . CAD Mother        First MI in her 56s, passed away before age 20  . Renal Disease Mother   . Diabetes Mellitus II Sister   . CAD Sister        Stents in her early 42s  . Renal Disease Sister   . Diabetes Mellitus II Brother     Social History Social History   Tobacco Use  . Smoking status: Current Some Day Smoker  . Smokeless tobacco: Never Used  Substance Use Topics  . Alcohol use: No  . Drug use: No     Allergies   Penicillins; Tetracyclines & related; and Morphine and related   Review of Systems Review of Systems All other systems negative except as documented in the HPI. All pertinent positives and negatives as reviewed in the HPI.  Physical Exam Updated Vital Signs BP (!)  182/85   Pulse 70   Temp 97.7 F (36.5 C) (Oral)   Resp 12   Ht 5\' 3"  (1.6 m)   Wt 100.4 kg   SpO2 97%   BMI 39.21 kg/m   Physical Exam  Constitutional: She is oriented to person, place, and time. She appears well-developed and well-nourished. No distress.  HENT:  Head: Normocephalic and atraumatic.  Mouth/Throat: Oropharynx is clear and moist.  Eyes: Pupils are equal, round, and reactive to light.  Neck: Normal range of motion. Neck supple.  Cardiovascular: Normal rate, regular rhythm and normal heart sounds. Exam reveals no gallop and no friction rub.  No murmur heard. Pulmonary/Chest: Effort normal and breath sounds normal. No respiratory distress. She has no wheezes.  Abdominal: Soft. Bowel sounds are  normal. She exhibits no distension. There is tenderness. There is no rebound and no guarding.  Neurological: She is alert and oriented to person, place, and time. She exhibits normal muscle tone. Coordination normal.  Skin: Skin is warm and dry. Capillary refill takes less than 2 seconds. No rash noted. No erythema.  Psychiatric: She has a normal mood and affect. Her behavior is normal.  Nursing note and vitals reviewed.    ED Treatments / Results  Labs (all labs ordered are listed, but only abnormal results are displayed) Labs Reviewed  COMPREHENSIVE METABOLIC PANEL - Abnormal; Notable for the following components:      Result Value   Sodium 130 (*)    CO2 21 (*)    Glucose, Bld 318 (*)    BUN 30 (*)    Creatinine, Ser 4.22 (*)    Calcium 8.3 (*)    Total Protein 5.8 (*)    Albumin 2.6 (*)    GFR calc non Af Amer 11 (*)    GFR calc Af Amer 13 (*)    All other components within normal limits  CBC - Abnormal; Notable for the following components:   RBC 3.43 (*)    Hemoglobin 8.9 (*)    HCT 29.1 (*)    MCH 25.9 (*)    RDW 15.7 (*)    All other components within normal limits  LIPASE, BLOOD  URINALYSIS, ROUTINE W REFLEX MICROSCOPIC    EKG None  Radiology Ct  Abdomen Pelvis Wo Contrast  Result Date: 07/15/2018 CLINICAL DATA:  Acute abdominal pain with emesis x5 per family and diarrhea 3 times. Productive cough x3 days. EXAM: CT ABDOMEN AND PELVIS WITHOUT CONTRAST TECHNIQUE: Multidetector CT imaging of the abdomen and pelvis was performed following the standard protocol without IV contrast due to elevated renal function tests. COMPARISON:  None. FINDINGS: Lower chest: Borderline cardiomegaly. No pericardial effusion. Bibasilar dependent atelectasis. Hepatobiliary: No focal liver abnormality is seen. No gallstones, gallbladder wall thickening, or biliary dilatation. Pancreas: Unremarkable. No pancreatic ductal dilatation or surrounding inflammatory changes. Spleen: Normal in size without focal abnormality. Adrenals/Urinary Tract: Normal bilateral adrenal glands. No nephrolithiasis nor obstructive uropathy. Mild ectasia of the ureters proximally. Physiologic distention of the urinary bladder without focal mural thickening or calculi. Stomach/Bowel: Stomach is within normal limits. Appendix appears normal. No evidence of bowel wall thickening, distention, or inflammatory changes. Vascular/Lymphatic: No significant vascular findings are present. No enlarged abdominal or pelvic lymph nodes. Reproductive: Status post hysterectomy. No adnexal masses. Other: Tiny periumbilical fat containing hernia. No free air nor free fluid. Musculoskeletal: No acute or significant osseous findings. IMPRESSION: No acute abdominal nor pelvic abnormality to account for the patient's symptoms. Electronically Signed   By: Ashley Royalty M.D.   On: 07/15/2018 23:00    Procedures Procedures (including critical care time)  Medications Ordered in ED Medications  promethazine (PHENERGAN) injection 25 mg (25 mg Intravenous Given 07/15/18 2145)     Initial Impression / Assessment and Plan / ED Course  I have reviewed the triage vital signs and the nursing notes.  Pertinent labs & imaging  results that were available during my care of the patient were reviewed by me and considered in my medical decision making (see chart for details).     She is laboratory testing and CT scans were reviewed.  The patient's laboratory testing show fairly stable chronic conditions.  The patient is feeling better at this time.  Patient did receive antiemetics here in the ER and she  is able to tolerate oral fluids.  I did give her the results for testing and all questions were answered.  The patient does appear to be feeling much better versus when she was first seen by me.  Final Clinical Impressions(s) / ED Diagnoses   Final diagnoses:  CKD (chronic kidney disease), stage IV [facility])    ED Discharge Orders    None       Dalia Heading, PA-C 07/16/18 0034    Carmin Muskrat, MD 07/16/18 2307

## 2018-07-19 ENCOUNTER — Encounter: Payer: Self-pay | Admitting: Family Medicine

## 2018-07-19 ENCOUNTER — Ambulatory Visit: Payer: Medicare Other | Attending: Family Medicine

## 2018-07-19 ENCOUNTER — Ambulatory Visit (INDEPENDENT_AMBULATORY_CARE_PROVIDER_SITE_OTHER): Payer: Medicare Other | Admitting: Family Medicine

## 2018-07-19 VITALS — BP 141/84 | HR 92 | Temp 98.2°F | Resp 17 | Ht 64.0 in | Wt 225.4 lb

## 2018-07-19 DIAGNOSIS — I161 Hypertensive emergency: Secondary | ICD-10-CM

## 2018-07-19 DIAGNOSIS — G40909 Epilepsy, unspecified, not intractable, without status epilepticus: Secondary | ICD-10-CM

## 2018-07-19 DIAGNOSIS — D638 Anemia in other chronic diseases classified elsewhere: Secondary | ICD-10-CM | POA: Diagnosis not present

## 2018-07-19 DIAGNOSIS — Z794 Long term (current) use of insulin: Secondary | ICD-10-CM | POA: Diagnosis not present

## 2018-07-19 DIAGNOSIS — E1165 Type 2 diabetes mellitus with hyperglycemia: Secondary | ICD-10-CM

## 2018-07-19 DIAGNOSIS — N185 Chronic kidney disease, stage 5: Secondary | ICD-10-CM | POA: Diagnosis not present

## 2018-07-19 MED ORDER — "INSULIN SYRINGE-NEEDLE U-100 31G X 5/16"" 0.3 ML MISC": 5 refills | Status: DC

## 2018-07-19 MED ORDER — FERROUS FUMARATE 324 (106 FE) MG PO TABS: 1.0000 | ORAL_TABLET | Freq: Every day | ORAL | 0 refills | Status: DC

## 2018-07-19 MED ORDER — METOPROLOL TARTRATE 50 MG PO TABS: 50.0000 mg | ORAL_TABLET | Freq: Two times a day (BID) | ORAL | 0 refills | Status: DC

## 2018-07-19 MED ORDER — AMLODIPINE BESYLATE 10 MG PO TABS: 10.0000 mg | ORAL_TABLET | Freq: Every day | ORAL | 0 refills | Status: DC

## 2018-07-19 MED ORDER — LEVETIRACETAM 500 MG PO TABS: 500.0000 mg | ORAL_TABLET | Freq: Two times a day (BID) | ORAL | 0 refills | Status: DC

## 2018-07-19 MED ORDER — INSULIN ASPART 100 UNIT/ML ~~LOC~~ SOLN: SUBCUTANEOUS | 0 refills | Status: DC

## 2018-07-19 MED ORDER — BLOOD GLUCOSE MONITOR KIT: PACK | 0 refills | Status: DC

## 2018-07-19 MED ORDER — INSULIN GLARGINE 100 UNIT/ML ~~LOC~~ SOLN: 50.0000 [IU] | Freq: Every day | SUBCUTANEOUS | 1 refills | Status: DC

## 2018-07-19 MED ORDER — GABAPENTIN 100 MG PO CAPS: 100.0000 mg | ORAL_CAPSULE | Freq: Two times a day (BID) | ORAL | 1 refills | Status: DC

## 2018-07-19 MED ORDER — ATORVASTATIN CALCIUM 40 MG PO TABS: 40.0000 mg | ORAL_TABLET | Freq: Every day | ORAL | 0 refills | Status: DC

## 2018-07-19 MED ORDER — PROMETHAZINE HCL 25 MG PO TABS: 25.0000 mg | ORAL_TABLET | Freq: Four times a day (QID) | ORAL | 0 refills | Status: DC | PRN

## 2018-07-19 MED FILL — levETIRAcetam 500 MG TABS: 500 | 30 days supply | Qty: 60 | Fill #0

## 2018-07-19 MED FILL — ATORVASTATIN 40 MG TABLET: 40 | 30 days supply | Qty: 30 | Fill #0

## 2018-07-19 MED FILL — PROMETHAZINE 25 MG TABLET: 25 | 2 days supply | Qty: 10 | Fill #0

## 2018-07-19 MED FILL — GABAPENTIN 100 MG CAPSULE: 100 | 30 days supply | Qty: 60 | Fill #0

## 2018-07-19 MED FILL — METOPROLOL TARTRATE 50 MG T: 50 | 30 days supply | Qty: 60 | Fill #0

## 2018-07-19 NOTE — Progress Notes (Signed)
Amber Lara, is a 57 y.o. female  HCW:[id number]  VVO:[id number]  DOB - [birth year]  CC:  Chief Complaint  Patient presents with  . Establish Care  . Follow-up    07/15/18 for nausea, vomiting, diarrhea. states that her symptoms have resolved  . Medication Refill    needs refills on her Gabapentin       HPI: Amber Lara is a 57 y.o. female is here today to establish care.   Chronic health problems include:has Type 2 diabetes mellitus with hyperglycemia ([place]); Normochromic normocytic anemia; Essential hypertension; Hyperkalemia; Acute kidney injury superimposed on chronic kidney disease ([place]); Acute on chronic renal failure ([place]); Seizure ([place]); Hypertensive emergency; and CKD (chronic kidney disease), stage V ([place]) on their problem list.     Concerns related to today's visit:  Amber Lara recently presented to the ED with a complaint of abdominal pain, nausea, and vomiting. CT of abdomen was negative. Patient's glucose was in the 300's. Report resolution of abdominal pain and nausea/vomitig. She suffers from CKD-5. Last GFR/Creatinine -13/4.22. She has been followed by [facility] in the past, however relocated out of town over 1 year ago and has not had recent nephrology follow-up. She reports a scheduled follow-up with [facility] within 2 weeks. Presently has a AV Fistula placed in right arm. Reports plans to eventually start dialysis which she is very resistant to starting. Last hemoglobin A1C 11.8 05/2018. She reports inconsistent monitoring of blood sugar at home and inconsistent administration of insulin. No home monitoring of blood pressure. Blood pressure in the past has been significant elevated secondary to medication noncompliance.  Blood pressure elevated on arrival today, however she has not taken medication this morning. Patient denies new headaches, chest pain, abdominal pain, nausea, new weakness , numbness or tingling, SOB, edema, or worrisome cough. Current  medications: Current Outpatient Medications:  .  acetaminophen (TYLENOL) 325 MG tablet, Take 2 tablets (650 mg total) by mouth every 6 (six) hours as needed for mild pain or moderate pain (or Fever >/= 101)., Disp: , Rfl:  .  amLODipine (NORVASC) 10 MG tablet, Take 1 tablet (10 mg total) by mouth daily., Disp: 30 tablet, Rfl: 0 .  atorvastatin (LIPITOR) 40 MG tablet, Take 1 tablet (40 mg total) by mouth daily at 6 PM., Disp: 30 tablet, Rfl: 0 .  benztropine (COGENTIN) 2 MG tablet, Take 2 mg by mouth 2 (two) times daily., Disp: , Rfl: 0 .  Cholecalciferol 50000 units TABS, Take 1 tablet by mouth once a week for 7 doses. (start on 06/13/18), Disp: 7 tablet, Rfl: 0 .  diclofenac sodium (VOLTAREN) 1 % GEL, Apply 2 g topically 4 (four) times daily. Apply to front of your mid chest. Use regularly for 3-5 days then use as needed. (Patient not taking: Reported on 06/02/2018), Disp: 100 g, Rfl: 0 .  docusate sodium (COLACE) 100 MG capsule, Take 200 mg by mouth daily., Disp: , Rfl:  .  Ferrous Fumarate (HEMOCYTE - 106 MG FE) 324 (106 Fe) MG TABS tablet, Take 1 tablet (106 mg of iron total) by mouth daily., Disp: 30 tablet, Rfl: 0 .  furosemide (LASIX) 20 MG tablet, Take 2 tablets (40 mg total) by mouth daily., Disp: 60 tablet, Rfl: 0 .  gabapentin (NEURONTIN) 100 MG capsule, Take 1 capsule (100 mg total) by mouth at bedtime., Disp: 30 capsule, Rfl: 0 .  insulin aspart (NOVOLOG) 100 UNIT/ML injection, 8 units TID with meals (Patient taking differently: Inject 0-20 Units into the skin 3 (three)  times daily. Pt states her max is 20 units. Greater than  300- call doctor), Disp: 10 mL, Rfl: 0 .  insulin glargine (LANTUS) 100 UNIT/ML injection, Inject 0.5 mLs (50 Units total) into the skin at bedtime., Disp: 10 mL, Rfl: 0 .  Insulin Pen Needle (NOVOFINE) 30G X 8 MM MISC, Inject insulin into skin per units prescribed., Disp: 200 each, Rfl: 1 .  Insulin Syringe-Needle U-100 (TRUEPLUS INSULIN SYRINGE) 31G X 5/16" 0.3 ML  MISC, Use as directed, Disp: 100 each, Rfl: 5 .  levETIRAcetam (KEPPRA) 500 MG tablet, Take 1 tablet (500 mg total) by mouth 2 (two) times daily., Disp: 60 tablet, Rfl: 0 .  metoprolol tartrate (LOPRESSOR) 50 MG tablet, Take 1 tablet (50 mg total) by mouth 2 (two) times daily., Disp: 60 tablet, Rfl: 0 .  Multiple Vitamin (MULTIVITAMIN WITH MINERALS) TABS tablet, Take 1 tablet by mouth daily., Disp: , Rfl:  .  promethazine (PHENERGAN) 25 MG tablet, Take 1 tablet (25 mg total) by mouth every 6 (six) hours as needed for nausea or vomiting., Disp: 10 tablet, Rfl: 0  Current Facility-Administered Medications:  .  injection device for insulin, , Other, Once, Scot Jun, FNP   Pertinent family medical history: family history includes CAD in her mother and sister; Diabetes Mellitus II in her brother, mother, and sister; Renal Disease in her mother and sister.    Social History   Socioeconomic History  . Marital status: Widowed    Spouse name: Not on file  . Number of children: Not on file  . Years of education: Not on file  . Highest education level: Not on file  Occupational History  . Not on file  Social Needs  . Financial resource strain: Not on file  . Food insecurity:    Worry: Not on file    Inability: Not on file  . Transportation needs:    Medical: Not on file    Non-medical: Not on file  Tobacco Use  . Smoking status: Current Some Day Smoker  . Smokeless tobacco: Never Used  Substance and Sexual Activity  . Alcohol use: No  . Drug use: No  . Sexual activity: Not on file  Lifestyle  . Physical activity:    Days per week: Not on file    Minutes per session: Not on file  . Stress: Not on file  Relationships  . Social connections:    Talks on phone: Not on file    Gets together: Not on file    Attends religious service: Not on file    Active member of club or organization: Not on file    Attends meetings of clubs or organizations: Not on file    Relationship  status: Not on file  . Intimate partner violence:    Fear of current or ex partner: Not on file    Emotionally abused: Not on file    Physically abused: Not on file    Forced sexual activity: Not on file  Other Topics Concern  . Not on file  Social History Narrative  . Not on file    Review of Systems: Constitutional: Negative for fever, chills, diaphoresis, activity change, appetite change and fatigue. HENT: Negative for ear pain, nosebleeds, congestion, facial swelling, rhinorrhea, neck pain, neck stiffness and ear discharge.  Eyes: Negative for pain, discharge, redness, itching and visual disturbance. Respiratory: Negative for cough, choking, chest tightness, shortness of breath, wheezing and stridor.  Cardiovascular: Negative for chest pain, palpitations and leg  swelling. Gastrointestinal: Negative for abdominal distention. Musculoskeletal: Negative for back pain, joint swelling, arthralgia and gait problem. Neurological: Negative for dizziness, tremors, seizures, syncope, facial asymmetry, speech difficulty, weakness, light-headedness, numbness and headaches.  Hematological: Negative for adenopathy. Does not bruise/bleed easily. Psychiatric/Behavioral: Negative for hallucinations, behavioral problems, confusion, dysphoric mood, decreased concentration and agitation.  Objective:   Vitals:   07/19/18 0927  BP: (!) 141/84  Pulse: 92  Resp: 17  Temp: 98.2 F (36.8 C)  SpO2: 98%    BP Readings from Last 3 Encounters:  07/16/18 (!) 184/79  06/08/18 (!) 170/72  08/09/17 (!) 186/95    Filed Weights   07/19/18 0927  Weight: 225 lb 6.4 oz (102.2 kg)      Physical Exam: Constitutional: Patient appears well-developed and well-nourished. No distress. HENT: Normocephalic, atraumatic, External right and left ear normal.  Eyes: Conjunctivae and EOM are normal. PERRLA, no scleral icterus. Neck: Normal ROM. Neck supple. No JVD. No tracheal deviation. No thyromegaly. CVS: RRR,  S1/S2 +, no murmurs, no gallops, no carotid bruit.  Pulmonary: Effort and breath sounds normal, no stridor, rhonchi, wheezes, rales.  Abdominal: Soft. BS +, no distension, tenderness, rebound or guarding.  Musculoskeletal: Normal range of motion. No edema and no tenderness.  Neuro: Alert. Normal reflexes, muscle tone coordination. No cranial nerve deficit. Skin: Skin is warm and dry. No rash noted. Not diaphoretic. No erythema. No pallor. Psychiatric: Normal mood and affect. Behavior, judgment, thought content normal.  Lab Results  Component Value Date   WBC 7.4 07/15/2018   HGB 8.9 (L) 07/15/2018   HCT 29.1 (L) 07/15/2018   MCV 84.8 07/15/2018   PLT 234 07/15/2018   Lab Results  Component Value Date   CREATININE 4.22 (H) 07/15/2018   BUN 30 (H) 07/15/2018   NA 130 (L) 07/15/2018   K 4.2 07/15/2018   CL 101 07/15/2018   CO2 21 (L) 07/15/2018    Lab Results  Component Value Date   HGBA1C 11.8 (H) 06/03/2018    Lipid Panel     Component Value Date/Time   CHOL 267 (H) 06/07/2018 0205   TRIG 301 (H) 06/07/2018 0205   HDL 53 06/07/2018 0205   CHOLHDL 5.0 06/07/2018 0205   VLDL 60 (H) 06/07/2018 0205   LDLCALC 154 (H) 06/07/2018 0205       Assessment and plan:  1. Type 2 diabetes mellitus with hyperglycemia, with long-term current use of insulin ([place]), A1c 11.3 uncontrolled. Patient is not consistently administering insulin as prescribed.  Aim for 30 minutes of exercise most days, with a goal of 150 minutes per week. -Glucose monitoring at minimal of twice daily and keep a log of readings. -Commit to medication adherence and self-adjustment as needed -increase foods containing whole grains (one-half of grain intake). -saturated fat intake should be reduced -reduce intake of trans fat (lowers LDL cholesterol and increases HDL cholesterol) -Eat 4-5 small meals during the day to reduce the risk of becoming hungry. - Basic metabolic panel; Future  2. Hypertension,  essential, uncontrolled We have discussed target BP range and blood pressure goal. I have advised patient to check BP regularly and to call us back or report to clinic if the numbers are consistently higher than 140/90. We discussed the importance of compliance with medical therapy and DASH diet recommended, consequences of uncontrolled hypertension discussed. Resume consistent adherence of current BP medications - Basic metabolic panel; Future, pending   3. Anemia, chronic disease, hemoglobin 9.9. Will check  CBC with Differential and  Iron, TIBC and Ferritin Panel  4. Seizure disorder ([place]), controlled Medication refill only  5. Chronic Renal Failure, stage 5 -Keep follow-up with nephrology. -Counseled patient regarding the importance of avoiding nephrotoxic medications. -Importance of maintaining stable glucose readings and controlled blood pressure   Return in about 2 weeks (around 08/02/2018), or Diabetes follow-up and medication evaluation .  The patient was given clear instructions to go to ER or return to medical center if symptoms don't improve, worsen or new problems develop. The patient verbalized understanding. The patient was told to call to get lab results if they haven't heard anything in the next week.    Molli Barrows, FNP Primary Care at [facility] [address] [place] [address] [phone]: [phone]    This note has been created with Dragon speech recognition software and Engineer, materials. Any transcriptional errors are unintentional.

## 2018-07-19 NOTE — Patient Instructions (Signed)
Review medication list attached and take all medications as prescribed today.  To pick-up medications and have labs drawn, go to:  [facility] [place], [place], [place] [address] [phone]   Thank you for choosing Primary Care at [facility] for your medical home!    Amber Lara was seen by Molli Barrows, FNP today.   Amber Lara's primary care doctor is Molli Barrows, FNP   For the best care possible,  you should try to see Molli Barrows, FNP  whenever you come to clinic.   We look forward to seeing you again soon!  If you have any questions about your visit today,  please call us at   Or feel free to reach your provider via [facility].

## 2018-07-20 LAB — CBC WITH DIFFERENTIAL/PLATELET
BASOS: 0 %
Basophils Absolute: 0 10*3/uL (ref 0.0–0.2)
EOS (ABSOLUTE): 0.2 10*3/uL (ref 0.0–0.4)
EOS: 3 %
HEMATOCRIT: 31.7 % — AB (ref 34.0–46.6)
HEMOGLOBIN: 9.9 g/dL — AB (ref 11.1–15.9)
Immature Grans (Abs): 0 10*3/uL (ref 0.0–0.1)
Immature Granulocytes: 0 %
LYMPHS ABS: 2.1 10*3/uL (ref 0.7–3.1)
Lymphs: 27 %
MCH: 26.1 pg — ABNORMAL LOW (ref 26.6–33.0)
MCHC: 31.2 g/dL — AB (ref 31.5–35.7)
MCV: 84 fL (ref 79–97)
MONOCYTES: 7 %
MONOS ABS: 0.6 10*3/uL (ref 0.1–0.9)
NEUTROS ABS: 4.7 10*3/uL (ref 1.4–7.0)
Neutrophils: 63 %
Platelets: 251 10*3/uL (ref 150–450)
RBC: 3.79 x10E6/uL (ref 3.77–5.28)
RDW: 16.4 % — AB (ref 12.3–15.4)
WBC: 7.5 10*3/uL (ref 3.4–10.8)

## 2018-07-20 LAB — BASIC METABOLIC PANEL
BUN/Creatinine Ratio: 8 — ABNORMAL LOW (ref 9–23)
BUN: 30 mg/dL — ABNORMAL HIGH (ref 6–24)
CO2: 20 mmol/L (ref 20–29)
Calcium: 8.8 mg/dL (ref 8.7–10.2)
Chloride: 100 mmol/L (ref 96–106)
Creatinine, Ser: 3.97 mg/dL — ABNORMAL HIGH (ref 0.57–1.00)
GFR calc Af Amer: 14 mL/min/{1.73_m2} — ABNORMAL LOW (ref >59)
GFR, EST NON AFRICAN AMERICAN: 12 mL/min/{1.73_m2} — AB (ref >59)
GLUCOSE: 287 mg/dL — AB (ref 65–99)
POTASSIUM: 4.2 mmol/L (ref 3.5–5.2)
Sodium: 136 mmol/L (ref 134–144)

## 2018-07-20 LAB — IRON,TIBC AND FERRITIN PANEL
Ferritin: 414 ng/mL — ABNORMAL HIGH (ref 15–150)
IRON: 30 ug/dL (ref 27–159)
Iron Saturation: 15 % (ref 15–55)
Total Iron Binding Capacity: 201 ug/dL — ABNORMAL LOW (ref 250–450)
UIBC: 171 ug/dL (ref 131–425)

## 2018-07-21 MED ORDER — INSULIN ASPART 100 UNIT/ML ~~LOC~~ SOLN: SUBCUTANEOUS | 3 refills | Status: DC

## 2018-07-23 ENCOUNTER — Other Ambulatory Visit: Payer: Self-pay

## 2018-07-23 DIAGNOSIS — Z794 Long term (current) use of insulin: Principal | ICD-10-CM

## 2018-07-23 DIAGNOSIS — E1165 Type 2 diabetes mellitus with hyperglycemia: Secondary | ICD-10-CM

## 2018-07-23 MED ORDER — AMLODIPINE BESYLATE 10 MG PO TABS: 10.0000 mg | ORAL_TABLET | Freq: Every day | ORAL | 0 refills | Status: DC

## 2018-07-23 MED ORDER — BLOOD GLUCOSE MONITOR KIT: PACK | 0 refills | Status: DC

## 2018-07-23 MED ORDER — INSULIN ASPART 100 UNIT/ML ~~LOC~~ SOLN: SUBCUTANEOUS | 3 refills | Status: DC

## 2018-07-23 MED FILL — !TRUE METRIX BLOOD GLUCOSE: 30 days supply | Qty: 1 | Fill #0

## 2018-07-23 MED FILL — AMLODIPINE BESYLATE 10 MG T: 10 | 30 days supply | Qty: 30 | Fill #0

## 2018-07-23 MED FILL — TRUE METRIX TEST STRIP: 30 days supply | Qty: 100 | Fill #0

## 2018-07-23 MED FILL — !LANTUS 100 UNITS/ML VIAL: 100 | 40 days supply | Qty: 20 | Fill #0

## 2018-07-23 MED FILL — TRUEplus LANCETS 28G MISC: 30 days supply | Qty: 100 | Fill #0

## 2018-07-25 ENCOUNTER — Other Ambulatory Visit: Payer: Self-pay

## 2018-07-25 MED ORDER — FERROUS FUMARATE 324 (106 FE) MG PO TABS: 1.0000 | ORAL_TABLET | Freq: Every day | ORAL | 0 refills | Status: DC

## 2018-07-25 NOTE — Progress Notes (Signed)
Patient notified of results. Expressed understanding.

## 2018-08-02 ENCOUNTER — Ambulatory Visit: Payer: Medicare Other | Admitting: Family Medicine

## 2018-08-14 ENCOUNTER — Encounter (HOSPITAL_COMMUNITY): Payer: Self-pay | Admitting: Emergency Medicine

## 2018-08-14 ENCOUNTER — Emergency Department (HOSPITAL_COMMUNITY)
Admission: EM | Admit: 2018-08-14 | Discharge: 2018-08-15 | Disposition: A | Discharge status: Home / Self Care | Payer: Medicare Other | Attending: Emergency Medicine | Admitting: Emergency Medicine

## 2018-08-14 ENCOUNTER — Emergency Department (HOSPITAL_COMMUNITY): Payer: Medicare Other

## 2018-08-14 DIAGNOSIS — R0789 Other chest pain: Secondary | ICD-10-CM | POA: Diagnosis present

## 2018-08-14 DIAGNOSIS — E119 Type 2 diabetes mellitus without complications: Secondary | ICD-10-CM | POA: Insufficient documentation

## 2018-08-14 DIAGNOSIS — I120 Hypertensive chronic kidney disease with stage 5 chronic kidney disease or end stage renal disease: Secondary | ICD-10-CM | POA: Insufficient documentation

## 2018-08-14 DIAGNOSIS — F17210 Nicotine dependence, cigarettes, uncomplicated: Secondary | ICD-10-CM | POA: Insufficient documentation

## 2018-08-14 DIAGNOSIS — Z794 Long term (current) use of insulin: Secondary | ICD-10-CM | POA: Insufficient documentation

## 2018-08-14 DIAGNOSIS — N185 Chronic kidney disease, stage 5: Secondary | ICD-10-CM | POA: Insufficient documentation

## 2018-08-14 DIAGNOSIS — Z91040 Latex allergy status: Secondary | ICD-10-CM | POA: Diagnosis not present

## 2018-08-14 DIAGNOSIS — H538 Other visual disturbances: Secondary | ICD-10-CM | POA: Diagnosis not present

## 2018-08-14 LAB — BASIC METABOLIC PANEL
ANION GAP: 10 (ref 5–15)
BUN: 48 mg/dL — ABNORMAL HIGH (ref 6–20)
CALCIUM: 8.8 mg/dL — AB (ref 8.9–10.3)
CO2: 20 mmol/L — ABNORMAL LOW (ref 22–32)
Chloride: 110 mmol/L (ref 98–111)
Creatinine, Ser: 5.04 mg/dL — ABNORMAL HIGH (ref 0.44–1.00)
GFR, EST AFRICAN AMERICAN: 10 mL/min — AB (ref >60)
GFR, EST NON AFRICAN AMERICAN: 9 mL/min — AB (ref >60)
GLUCOSE: 81 mg/dL (ref 70–99)
Potassium: 4.4 mmol/L (ref 3.5–5.1)
SODIUM: 140 mmol/L (ref 135–145)

## 2018-08-14 LAB — CBC
HCT: 31.2 % — ABNORMAL LOW (ref 36.0–46.0)
Hemoglobin: 9.4 g/dL — ABNORMAL LOW (ref 12.0–15.0)
MCH: 25.4 pg — AB (ref 26.0–34.0)
MCHC: 30.1 g/dL (ref 30.0–36.0)
MCV: 84.3 fL (ref 80.0–100.0)
NRBC: 0 % (ref 0.0–0.2)
PLATELETS: 264 10*3/uL (ref 150–400)
RBC: 3.7 MIL/uL — AB (ref 3.87–5.11)
RDW: 15.3 % (ref 11.5–15.5)
WBC: 6.5 10*3/uL (ref 4.0–10.5)

## 2018-08-14 LAB — I-STAT BETA HCG BLOOD, ED (MC, WL, AP ONLY): I-stat hCG, quantitative: <5 m[IU]/mL (ref <5)

## 2018-08-14 LAB — I-STAT TROPONIN, ED: TROPONIN I, POC: 0 ng/mL (ref 0.00–0.08)

## 2018-08-14 NOTE — ED Triage Notes (Signed)
Pt arrives from home with right sided chest pain with a productive cough for 3 days. Pt also reports sob while lying flat.

## 2018-08-14 NOTE — ED Provider Notes (Signed)
[place] [facility] DEPART[facility] Provider Note  CSN: [id number] Arrival date & time: 08/14/18 1446  Chief Complaint(s) Chest Pain  HPI Amber Lara is a 57 y.o. female with extensive past medical history listed below  The history is provided by the patient.  Chest Pain   This is a new problem. Episode onset: 3 days. The problem occurs constantly. Progression since onset: fluctuating. The pain is associated with movement. Pain location: right anterior chest. Pain severity now: moderate to severe. The quality of the pain is described as sharp. The pain does not radiate. Duration of episode(s) is 3 days. The symptoms are aggravated by certain positions. Associated symptoms include cough, shortness of breath (only when pain is severe) and sputum production. Pertinent negatives include no back pain, no exertional chest pressure, no fever, no hemoptysis, no nausea and no vomiting. Risk factors include obesity.  Her past medical history is significant for CHF (diastolic), diabetes and hypertension.  Pertinent negatives for past medical history include no MI and no PE.  Procedure history is negative for cardiac catheterization.    Past Medical History Past Medical History:  Diagnosis Date  . Arthritis   . Chronic anemia   . CKD (chronic kidney disease), stage V ([place])   . Diabetes mellitus ([place])   . History of stroke   . Hypertension   . Obesity   . Psychosis ([place])    a. prior adm in [place] ([place]) in 2015 where pt was admitted with hallucinations, seeing men in black outfits, family members on a non-existent camera, hearing voices and seeing animals - UDS positive for methamphetamines at that time and patient stated she believed someone had put this in her body.  . Seizures [facility])    Patient Active Problem List   Diagnosis Date Noted  . CKD (chronic kidney disease), stage V ([place]) 06/04/2018  . Hypertensive emergency 06/02/2018  . Acute on chronic  renal failure ([place]) 08/03/2017  . Seizure ([place]) 08/03/2017  . Hyperkalemia 07/14/2017  . Acute kidney injury superimposed on chronic kidney disease ([place]) 07/14/2017  . Type 2 diabetes mellitus with hyperglycemia ([place]) 06/23/2017  . Normochromic normocytic anemia 06/23/2017  . Essential hypertension 06/23/2017   Home Medication(s) Prior to Admission medications   Medication Sig Start Date End Date Taking? Authorizing Provider  acetaminophen (TYLENOL) 325 MG tablet Take 2 tablets (650 mg total) by mouth every 6 (six) hours as needed for mild pain or moderate pain (or Fever >/= 101). 07/15/17  Yes Hongalgi, Lenis Dickinson, MD  amLODipine (NORVASC) 10 MG tablet Take 1 tablet (10 mg total) by mouth daily. 07/23/18  Yes Scot Jun, FNP  atorvastatin (LIPITOR) 40 MG tablet Take 1 tablet (40 mg total) by mouth daily at 6 PM. 07/19/18  Yes Scot Jun, FNP  benztropine (COGENTIN) 2 MG tablet Take 2 mg by mouth 2 (two) times daily. 03/24/17   [provider]  blood glucose meter kit and supplies KIT Dispense based on patient and insurance preference. Use up to four times daily as directed. (FOR ICD-9 250.00, 250.01). 07/23/18   Scot Jun, FNP  docusate sodium (COLACE) 100 MG capsule Take 200 mg by mouth daily.    [provider]  Ferrous Fumarate (HEMOCYTE - 106 MG FE) 324 (106 Fe) MG TABS tablet Take 1 tablet (106 mg of iron total) by mouth daily. 07/25/18   Scot Jun, FNP  furosemide (LASIX) 20 MG tablet Take 2 tablets (40 mg total) by mouth  daily. 06/07/18 07/07/18  Elodia Florence., MD  gabapentin (NEURONTIN) 100 MG capsule Take 1 capsule (100 mg total) by mouth 2 (two) times daily. 07/19/18   Scot Jun, FNP  insulin aspart (NOVOLOG) 100 UNIT/ML injection Inject 3 times daily based on sliding scale: 125=0 units 126-180=1 unit, 181-200=2 units 201-250 administer= 4 units 251-300 administer =6 units 301 or more 8 units-call provider 07/23/18    Scot Jun, FNP  insulin glargine (LANTUS) 100 UNIT/ML injection Inject 0.5 mLs (50 Units total) into the skin at bedtime. 07/19/18 11/16/18  Scot Jun, FNP  Insulin Pen Needle (NOVOFINE) 30G X 8 MM MISC Inject insulin into skin per units prescribed. 07/19/17   Scot Jun, FNP  Insulin Syringe-Needle U-100 (TRUEPLUS INSULIN SYRINGE) 31G X 5/16" 0.3 ML MISC Use as directed 07/19/18   Scot Jun, FNP  levETIRAcetam (KEPPRA) 500 MG tablet Take 1 tablet (500 mg total) by mouth 2 (two) times daily. 07/19/18 08/18/18  Scot Jun, FNP  metoprolol tartrate (LOPRESSOR) 50 MG tablet Take 1 tablet (50 mg total) by mouth 2 (two) times daily. 07/19/18 08/18/18  Scot Jun, FNP  Multiple Vitamin (MULTIVITAMIN WITH MINERALS) TABS tablet Take 1 tablet by mouth daily.    [provider]  promethazine (PHENERGAN) 25 MG tablet Take 1 tablet (25 mg total) by mouth every 6 (six) hours as needed for nausea or vomiting. 07/19/18   Scot Jun, FNP                                                                                                                                    Past Surgical History Past Surgical History:  Procedure Laterality Date  . ABDOMINAL HYSTERECTOMY    . AV FISTULA PLACEMENT Right 06/07/2018   Procedure: CREATION OF RIGHT BRACHIOCEPHALIC ARTERIOVENOUS FISTULA;  Surgeon: Serafina Mitchell, MD;  Location: [facility] OR;  Service: Vascular;  Laterality: Right;   Family History Family History  Problem Relation Age of Onset  . Diabetes Mellitus II Mother   . CAD Mother        First MI in her 85s, passed away before age 62  . Renal Disease Mother   . Diabetes Mellitus II Sister   . CAD Sister        Stents in her early 16s  . Renal Disease Sister   . Diabetes Mellitus II Brother     Social History Social History   Tobacco Use  . Smoking status: Current Some Day Smoker  . Smokeless tobacco: Never Used  Substance Use Topics  . Alcohol  use: No  . Drug use: No   Allergies Penicillins; Tetracyclines & related; Morphine and related; Latex; and Tape  Review of Systems Review of Systems  Constitutional: Negative for fever.  Respiratory: Positive for cough, sputum production and shortness of breath (only when pain is severe). Negative for hemoptysis.   Cardiovascular: Positive  for chest pain.  Gastrointestinal: Negative for nausea and vomiting.  Musculoskeletal: Negative for back pain.   All other systems are reviewed and are negative for acute change except as noted in the HPI  Physical Exam Vital Signs  I have reviewed the triage vital signs BP (!) 162/102   Pulse 80   Temp 98.7 F (37.1 C) (Oral)   Resp 16   SpO2 99%   Physical Exam  Constitutional: She is oriented to person, place, and time. She appears well-developed and well-nourished. No distress.  HENT:  Head: Normocephalic and atraumatic.  Nose: Nose normal.  Eyes: Pupils are equal, round, and reactive to light. Conjunctivae and EOM are normal. Right eye exhibits no discharge. Left eye exhibits no discharge. No scleral icterus.  Neck: Normal range of motion. Neck supple.  Cardiovascular: Normal rate and regular rhythm. Exam reveals no gallop and no friction rub.  No murmur heard. Pulmonary/Chest: Effort normal and breath sounds normal. No stridor. No respiratory distress. She has no rales.        Abdominal: Soft. She exhibits no distension. There is no tenderness.  Musculoskeletal: She exhibits no edema or tenderness.  Neurological: She is alert and oriented to person, place, and time.  Skin: Skin is warm and dry. No rash noted. She is not diaphoretic. No erythema.  Psychiatric: She has a normal mood and affect.  Vitals reviewed.   ED Results and Treatments Labs (all labs ordered are listed, but only abnormal results are displayed) Labs Reviewed  BASIC METABOLIC PANEL - Abnormal; Notable for the following components:      Result Value    CO2 20 (*)    BUN 48 (*)    Creatinine, Ser 5.04 (*)    Calcium 8.8 (*)    GFR calc non Af Amer 9 (*)    GFR calc Af Amer 10 (*)    All other components within normal limits  CBC - Abnormal; Notable for the following components:   RBC 3.70 (*)    Hemoglobin 9.4 (*)    HCT 31.2 (*)    MCH 25.4 (*)    All other components within normal limits  I-STAT TROPONIN, ED  I-STAT BETA HCG BLOOD, ED ([facility], [facility], [facility] ONLY)                                                                                                                         EKG  EKG Interpretation  Date/Time:  Tuesday August 14 2018 14:51:02 EST Ventricular Rate:  72 PR Interval:  210 QRS Duration: 88 QT Interval:  412 QTC Calculation: 451 R Axis:   57 Text Interpretation:  Sinus rhythm with 1st degree A-V block with Premature atrial complexes Cannot rule out Anterior infarct , age undetermined Abnormal ECG No significant change since last tracing Reconfirmed by Addison Lank [phone]) on 08/14/2018 11:08:39 PM      Radiology Dg Chest 2 View  Result Date: 08/14/2018 CLINICAL DATA:  Right chest pain  and productive cough for 3 days EXAM: CHEST - 2 VIEW COMPARISON:  June 02, 2018 FINDINGS: The mediastinal contour is normal. The heart size is mildly enlarged. Both lungs are clear. The visualized skeletal structures are unremarkable. IMPRESSION: No active cardiopulmonary disease. Electronically Signed   By: Abelardo Diesel M.D.   On: 08/14/2018 15:21   Pertinent labs & imaging results that were available during my care of the patient were reviewed by me and considered in my medical decision making (see chart for details).  Medications Ordered in ED Medications  acetaminophen (TYLENOL) tablet 1,000 mg (has no administration in time range)                                                                                                                                    Procedures Procedures  (including critical care  time)  Medical Decision Making / ED Course I have reviewed the nursing notes for this encounter and the patient's prior records (if available in EHR or on provided paperwork).    1. Chest pain Highly atypical pain most consistent with MSK.  However given comorbidities triage obtain cardiac work-up.  EKG without acute ischemic changes or evidence of pericarditis.  Troponin negative.  Given the fact that her pain is been ongoing for 3 days constantly, feel that this is sufficient to rule out ACS.  Low suspicion for pulmonary embolism.  Doubt aortic dissection or esophageal perforation. Chest x-ray without evidence suggestive of pneumonia, pneumothorax, pneumomediastinum.  No abnormal contour of the mediastinum to suggest dissection. No evidence of acute injuries.   2. Right eye blurry vision 3 days of right eye blurry vision.  On funduscope I am unable to see the optic disc on the right.  Suspicion for vitreous hemorrhage.  Discussed with ophthalmology who will see the patient in clinic.  3. CKD Worsening renal function, but close to her new baseline.  No hyperkalemia or significantly elevated uremia.  She does not volume overloaded requiring emergent dialysis.  Patient already has a right brachiocephalic AV fistula with good thrill.  She reports that she already has a follow-up appointment with her nephrologist within the week.  Feel she is appropriate for discharge given the close follow-up.  The patient appears reasonably screened and/or stabilized for discharge and I doubt any other medical condition or other [facility] requiring further screening, evaluation, or treatment in the ED at this time prior to discharge.  The patient is safe for discharge with strict return precautions.   Final Clinical Impression(s) / ED Diagnoses Final diagnoses:  Chest wall pain  Stage 5 chronic kidney disease not on chronic dialysis (HCC)  Blurry vision, right eye   Disposition: Discharge  Condition: Good  I  have discussed the results, Dx and Tx plan with the patient who expressed understanding and agree(s) with the plan. Discharge instructions discussed at great length. The patient was given strict return precautions who verbalized understanding  of the instructions. No further questions at time of discharge.    ED Discharge Orders    None       Follow Up: Scot Jun, [place] [id number] [phone]  Schedule an appointment as soon as possible for a visit  As needed  Nephrology  On 08/20/2018 As scheduled  Luberta Mutter, MD [address] [place]  [id number] [phone]  On 08/15/2018 at 10 am to follow up for your blurry vision      This chart was dictated using voice recognition software.  Despite best efforts to proofread,  errors can occur which can change the documentation meaning.   Fatima Blank, MD 08/15/18 [phone]

## 2018-08-14 NOTE — ED Notes (Signed)
Pt given crackers and water, OK per MD

## 2018-08-14 NOTE — ED Notes (Signed)
ED Provider at bedside. 

## 2018-08-15 MED ORDER — ACETAMINOPHEN 500 MG PO TABS
1000.0000 mg | ORAL_TABLET | Freq: Once | ORAL | Status: AC
  Administered 2018-08-15: 1000 mg via ORAL
  Filled 2018-08-15: qty 2

## 2018-08-15 NOTE — Discharge Instructions (Signed)
You may use over-the-counter Acetaminophen (Tylenol), topical muscle creams such as SalonPas, Icy Hot, Bengay, etc. Please stretch, apply heat, and have massage therapy for additional assistance.  

## 2018-08-15 NOTE — ED Notes (Signed)
Patient verbalizes understanding of discharge instructions. Opportunity for questioning and answers were provided. Armband removed by staff, pt discharged from ED in wheelchair with family.

## 2018-08-15 NOTE — ED Notes (Signed)
ED Provider at bedside. 

## 2018-08-20 ENCOUNTER — Encounter: Payer: Self-pay | Admitting: Surgery

## 2018-08-20 ENCOUNTER — Encounter (HOSPITAL_COMMUNITY): Payer: Self-pay

## 2018-08-27 ENCOUNTER — Encounter (HOSPITAL_COMMUNITY): Payer: Self-pay

## 2018-08-27 ENCOUNTER — Encounter: Payer: Self-pay | Admitting: Surgery

## 2018-08-28 ENCOUNTER — Encounter: Payer: Self-pay | Admitting: Surgery

## 2018-09-21 ENCOUNTER — Encounter (HOSPITAL_COMMUNITY): Payer: Self-pay | Admitting: Emergency Medicine

## 2018-09-21 ENCOUNTER — Emergency Department (HOSPITAL_COMMUNITY)
Admission: EM | Admit: 2018-09-21 | Discharge: 2018-09-21 | Disposition: A | Discharge status: Home / Self Care | Payer: Medicare Other | Attending: Emergency Medicine | Admitting: Emergency Medicine

## 2018-09-21 DIAGNOSIS — Z79899 Other long term (current) drug therapy: Secondary | ICD-10-CM | POA: Diagnosis not present

## 2018-09-21 DIAGNOSIS — E1122 Type 2 diabetes mellitus with diabetic chronic kidney disease: Secondary | ICD-10-CM | POA: Insufficient documentation

## 2018-09-21 DIAGNOSIS — Z794 Long term (current) use of insulin: Secondary | ICD-10-CM | POA: Insufficient documentation

## 2018-09-21 DIAGNOSIS — N185 Chronic kidney disease, stage 5: Secondary | ICD-10-CM | POA: Diagnosis not present

## 2018-09-21 DIAGNOSIS — F22 Delusional disorders: Secondary | ICD-10-CM | POA: Diagnosis not present

## 2018-09-21 DIAGNOSIS — Z8673 Personal history of transient ischemic attack (TIA), and cerebral infarction without residual deficits: Secondary | ICD-10-CM | POA: Insufficient documentation

## 2018-09-21 DIAGNOSIS — Z91040 Latex allergy status: Secondary | ICD-10-CM | POA: Diagnosis not present

## 2018-09-21 DIAGNOSIS — I120 Hypertensive chronic kidney disease with stage 5 chronic kidney disease or end stage renal disease: Secondary | ICD-10-CM | POA: Insufficient documentation

## 2018-09-21 DIAGNOSIS — F3170 Bipolar disorder, currently in remission, most recent episode unspecified: Secondary | ICD-10-CM | POA: Diagnosis not present

## 2018-09-21 DIAGNOSIS — F17200 Nicotine dependence, unspecified, uncomplicated: Secondary | ICD-10-CM | POA: Diagnosis not present

## 2018-09-21 LAB — CBC
HEMATOCRIT: 34.1 % — AB (ref 36.0–46.0)
Hemoglobin: 10.8 g/dL — ABNORMAL LOW (ref 12.0–15.0)
MCH: 25.2 pg — ABNORMAL LOW (ref 26.0–34.0)
MCHC: 31.7 g/dL (ref 30.0–36.0)
MCV: 79.7 fL — ABNORMAL LOW (ref 80.0–100.0)
Platelets: 308 10*3/uL (ref 150–400)
RBC: 4.28 MIL/uL (ref 3.87–5.11)
RDW: 14.3 % (ref 11.5–15.5)
WBC: 7.6 10*3/uL (ref 4.0–10.5)
nRBC: 0 % (ref 0.0–0.2)

## 2018-09-21 LAB — COMPREHENSIVE METABOLIC PANEL
ALBUMIN: 2.8 g/dL — AB (ref 3.5–5.0)
ALT: 15 U/L (ref 0–44)
ANION GAP: 15 (ref 5–15)
AST: 15 U/L (ref 15–41)
Alkaline Phosphatase: 139 U/L — ABNORMAL HIGH (ref 38–126)
BILIRUBIN TOTAL: 0.6 mg/dL (ref 0.3–1.2)
BUN: 50 mg/dL — ABNORMAL HIGH (ref 6–20)
CO2: 16 mmol/L — ABNORMAL LOW (ref 22–32)
Calcium: 9 mg/dL (ref 8.9–10.3)
Chloride: 101 mmol/L (ref 98–111)
Creatinine, Ser: 6.3 mg/dL — ABNORMAL HIGH (ref 0.44–1.00)
GFR calc non Af Amer: 7 mL/min — ABNORMAL LOW (ref >60)
GFR, EST AFRICAN AMERICAN: 8 mL/min — AB (ref >60)
GLUCOSE: 445 mg/dL — AB (ref 70–99)
POTASSIUM: 4.2 mmol/L (ref 3.5–5.1)
Sodium: 132 mmol/L — ABNORMAL LOW (ref 135–145)
TOTAL PROTEIN: 6.9 g/dL (ref 6.5–8.1)

## 2018-09-21 LAB — ETHANOL

## 2018-09-21 MED ORDER — OLANZAPINE 5 MG PO TBDP
5.0000 mg | ORAL_TABLET | Freq: Three times a day (TID) | ORAL | Status: DC | PRN
  Filled 2018-09-21: qty 1

## 2018-09-21 MED ORDER — LORAZEPAM 1 MG PO TABS: 1.0000 mg | ORAL_TABLET | ORAL | Status: DC | PRN

## 2018-09-21 MED ORDER — ZIPRASIDONE MESYLATE 20 MG IM SOLR: 20.0000 mg | INTRAMUSCULAR | Status: DC | PRN

## 2018-09-21 MED ORDER — ACETAMINOPHEN 325 MG PO TABS: 650.0000 mg | ORAL_TABLET | ORAL | Status: DC | PRN

## 2018-09-21 NOTE — ED Provider Notes (Signed)
The patient has been seen by psychiatry, she has been cleared from a psychiatric standpoint, will consult with social work to help with housing assistance or placement or guidelines.  The patient does not appear to be a danger to herself or others.  There is no indication for psychiatric placement  SW has assisted in getting patient to the family justice center.   Noemi Chapel, MD 09/21/18 [phone]

## 2018-09-21 NOTE — Progress Notes (Signed)
Patient is seen by me via tele-psych and I have consulted with Dr. Dwyane Dee.  Patient denies any suicidal or homicidal ideations and denies any hallucinations.  Patient states that she came to the emergency department seeking assistance with housing.  She reports that she lives in a boardinghouse and is a very poor living situation.  She states that she does have a history of bipolar disorder and has not been on her medications in approximately 5 years, but she denies having any severe episodes requiring hospitalization or needing any medications.  Patient carries on a very logical conversation and discusses her past of living in [place] and the weather that is outside today and she is used to it.  Patient does not meet inpatient criteria and is psychiatrically cleared.  I have contacted Dr. Sabra Heck and notified him of the recommendations.  We will request social work consult to possibly assist patient with other housing options.
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
## 2018-09-21 NOTE — Discharge Instructions (Signed)
Substance Abuse Treatment Programs ° °Intensive Outpatient Programs °[facility]     °[address]      °[place], [place]                   °[phone]      ° °[facility] °[address] °[place], [place] °[phone] ° °[facility]     °(Inpatient and outpatient)     °[address]           °[phone]   ° °[facility] °[phone] (Suboxone and Methadone) ° °[address]      °[place], [place] [address]      °[phone]      ° °[address] °[place], [place] °852-3033 ° °[facility] (Outpatient/Inpatient, [facility])    °(insurance only) [phone]      °       °[facility] (Groups & Residential) °[place], [place] °[phone] ° °   °[facility]     °[address]     °[place], [place]      °[phone]      ° °[facility] (for caregivers and family) °[address] °[place], [place] °336-299-4655 ° ° ° ° ° °Residential Treatment Programs °[facility]      °[address], [place], [place] [address]  °[phone]      ° °[facility] °[address] [place], [place] [address] °[phone] ° °[facility]        °[phone]      ° °[facility] °1-800-659-3381 ° °[facility]             °[address]                                         °[place], [place]                                                °[phone] or [phone]                              ° °[facility] [place] °[address] °[place], [address] °[phone] ° °[facility]    °[address]      °[place], [place]     °[phone]      ° °[facility] °[address] °[place], [place] °336-285-9073 ° °[facility]   °[address]     °[place], [place] [address]     °[phone]      °Admissions: 8am-3pm M-F ° °[facility] °[address] °[place],  [place] °336-227-7417 ° °[facility]: Residential Program (90 Days)   °[place], [place]      °336-725-8389 or 800-758-6077    ° °[facility] °[place],  °(Walk in Hours over the weekend or by referral) ° °[facility] °[address], [place],  [address] °([phone] ° °C[facility] Mobile: [facility]:  1-877-626-1772 (for crisis response 24 hours a day) °[facility] Hotline:      1-800-256-2452 °Outpatient Psychiatry and Counseling ° °[facility]: Mobile Crisis   Management 24 hours:  1-877-626-1772 ° °[facility] sliding scale fee and walk in schedule: M-F 8am-12pm/1pm-3pm °[address]  °[place], [place] [address] °[phone] ° °[facility] °[address] °[place], [place] [address] °336-703-9650 ° °[facility] ([facility])- new patient walk-in appointments available Monday - Friday 8am -3pm.          °[address] °[place], [place] [address] °336-676-6840 or crisis line- 336-676-6905 ° °[facility] Intensive [facility]patient Therapy Program °[address] °[place], [place] [address] °336-832-9804 ° °[facility]                  °Crisis Services      °[phone]      °[address]     °[place], [place] [address]                ° °[facility]   °[facility] °[phone] °[address] °[place], [place] [address] ° ° °[facility]          °[address]  °[place], [place] [address]       °([phone] ° °[facility] °[address] °[place], [place] [address] °336-292-1510 ° °[facility]    °[address]    °[place], [place] [address]     °[phone]      ° °Parish McKinney, MD     °[address]     °[place] [address]     °336-282-1251     °  °[facility] °[address] °[place] [address] ° °[facility]     °[address]     °[place], [place]      °336-542-2076      ° °[facility] °Shamsher Ahluwalia, MD °[address] °[place], [place] [address] °[phone] ° °[facility]     °[address]     °[place], [place] [address]     °336-274-1237      ° °[facility] °[address] °[place], [place] [address] °336-854-4450 °Resources for Temporary Residential Assistance/Crisis Centers ° °DAY CENTERS °[facility] °M-F 8am-3pm   °[address] [place], [place] [address]   336-332-0824 °Services include: laundry, barbering, support groups, case management, phone  & computer access, showers, AA/NA mtgs, mental health/substance abuse nurse, job skills class, disability information, VA assistance, spiritual classes, etc.  ° °HOMELESS SHELTERS ° °[facility]     °[facility]   °[address], [place]     °[phone]       °       °[facility] (women and children)       °[address] °[place], [place] [address] °336-275-0820 °[email or web address] for application and process °Application Required ° °[facility]   °[address]    °[place] [address]     °[phone]       °             °[facility] °[address] °,  [address] °336.273.5572 °336-235-0363(schedule application appt.) °Application Required ° °[facility] (women only)    °[address]     °[place],  [address]     °336-884-1039      °  Intake starts 6pm daily °Need valid ID, SSC, & Police report °[facility] [place] °[address] °[place], [place] °336-881-5420 °Application Required ° °[facility] (men only)     °[address]      °[place], [place]     °[phone]      ° °[facility] °(Pregnant women only) °[address] °[place], [place] °336-275-0206 ° °[facility]      °[address]      °[place], [place] [address]     °[phone]      °       °[facility] °[address] °[place], [place] °336-723-1848 °90 day commitment/SA/Application process ° °[facility](men only)     °[address]     °[place], [place]     °[phone]       °Check-in at 7pm     °       °[facility] °[address] °[place], [place] [address] °336-248-6684 °Men/Women/Women and Children must be there by 7 pm ° °[facility] °[place], [place] °336-722-8721                ° °
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
## 2018-09-21 NOTE — ED Notes (Signed)
TTS done and  Then pa spoke to dr

## 2018-10-05 ENCOUNTER — Inpatient Hospital Stay (HOSPITAL_COMMUNITY)
Admission: EM | Admit: 2018-10-05 | Discharge: 2018-10-16 | DRG: 674 | Disposition: A | Discharge status: Skilled Nursing Facility | Payer: Medicare Other | Attending: Internal Medicine | Admitting: Internal Medicine

## 2018-10-05 ENCOUNTER — Emergency Department (HOSPITAL_BASED_OUTPATIENT_CLINIC_OR_DEPARTMENT_OTHER): Payer: Medicare Other

## 2018-10-05 ENCOUNTER — Encounter (HOSPITAL_COMMUNITY): Payer: Self-pay

## 2018-10-05 ENCOUNTER — Other Ambulatory Visit: Payer: Self-pay

## 2018-10-05 ENCOUNTER — Emergency Department (HOSPITAL_COMMUNITY): Payer: Medicare Other

## 2018-10-05 DIAGNOSIS — I10 Essential (primary) hypertension: Secondary | ICD-10-CM

## 2018-10-05 DIAGNOSIS — E669 Obesity, unspecified: Secondary | ICD-10-CM | POA: Diagnosis present

## 2018-10-05 DIAGNOSIS — M7989 Other specified soft tissue disorders: Secondary | ICD-10-CM

## 2018-10-05 DIAGNOSIS — E872 Acidosis: Secondary | ICD-10-CM | POA: Diagnosis present

## 2018-10-05 DIAGNOSIS — Z7901 Long term (current) use of anticoagulants: Secondary | ICD-10-CM

## 2018-10-05 DIAGNOSIS — I161 Hypertensive emergency: Secondary | ICD-10-CM | POA: Diagnosis not present

## 2018-10-05 DIAGNOSIS — T829XXA Unspecified complication of cardiac and vascular prosthetic device, implant and graft, initial encounter: Secondary | ICD-10-CM

## 2018-10-05 DIAGNOSIS — N2581 Secondary hyperparathyroidism of renal origin: Secondary | ICD-10-CM | POA: Diagnosis present

## 2018-10-05 DIAGNOSIS — R739 Hyperglycemia, unspecified: Secondary | ICD-10-CM

## 2018-10-05 DIAGNOSIS — D631 Anemia in chronic kidney disease: Secondary | ICD-10-CM | POA: Diagnosis not present

## 2018-10-05 DIAGNOSIS — E1165 Type 2 diabetes mellitus with hyperglycemia: Secondary | ICD-10-CM | POA: Diagnosis present

## 2018-10-05 DIAGNOSIS — Z6839 Body mass index (BMI) 39.0-39.9, adult: Secondary | ICD-10-CM | POA: Diagnosis not present

## 2018-10-05 DIAGNOSIS — I120 Hypertensive chronic kidney disease with stage 5 chronic kidney disease or end stage renal disease: Secondary | ICD-10-CM | POA: Diagnosis not present

## 2018-10-05 DIAGNOSIS — Z841 Family history of disorders of kidney and ureter: Secondary | ICD-10-CM

## 2018-10-05 DIAGNOSIS — Z9114 Patient's other noncompliance with medication regimen: Secondary | ICD-10-CM

## 2018-10-05 DIAGNOSIS — R569 Unspecified convulsions: Secondary | ICD-10-CM

## 2018-10-05 DIAGNOSIS — N179 Acute kidney failure, unspecified: Principal | ICD-10-CM

## 2018-10-05 DIAGNOSIS — E785 Hyperlipidemia, unspecified: Secondary | ICD-10-CM | POA: Diagnosis present

## 2018-10-05 DIAGNOSIS — I82411 Acute embolism and thrombosis of right femoral vein: Secondary | ICD-10-CM

## 2018-10-05 DIAGNOSIS — Z590 Homelessness: Secondary | ICD-10-CM

## 2018-10-05 DIAGNOSIS — R52 Pain, unspecified: Secondary | ICD-10-CM

## 2018-10-05 DIAGNOSIS — Z79899 Other long term (current) drug therapy: Secondary | ICD-10-CM

## 2018-10-05 DIAGNOSIS — Z794 Long term (current) use of insulin: Secondary | ICD-10-CM

## 2018-10-05 DIAGNOSIS — E1122 Type 2 diabetes mellitus with diabetic chronic kidney disease: Secondary | ICD-10-CM | POA: Diagnosis not present

## 2018-10-05 DIAGNOSIS — Z833 Family history of diabetes mellitus: Secondary | ICD-10-CM

## 2018-10-05 DIAGNOSIS — Z90710 Acquired absence of both cervix and uterus: Secondary | ICD-10-CM | POA: Diagnosis not present

## 2018-10-05 DIAGNOSIS — Z23 Encounter for immunization: Secondary | ICD-10-CM | POA: Diagnosis not present

## 2018-10-05 DIAGNOSIS — R079 Chest pain, unspecified: Secondary | ICD-10-CM | POA: Diagnosis not present

## 2018-10-05 DIAGNOSIS — Z8673 Personal history of transient ischemic attack (TIA), and cerebral infarction without residual deficits: Secondary | ICD-10-CM

## 2018-10-05 DIAGNOSIS — Z8249 Family history of ischemic heart disease and other diseases of the circulatory system: Secondary | ICD-10-CM

## 2018-10-05 DIAGNOSIS — N186 End stage renal disease: Secondary | ICD-10-CM

## 2018-10-05 DIAGNOSIS — G40909 Epilepsy, unspecified, not intractable, without status epilepticus: Secondary | ICD-10-CM | POA: Diagnosis not present

## 2018-10-05 DIAGNOSIS — F17200 Nicotine dependence, unspecified, uncomplicated: Secondary | ICD-10-CM | POA: Diagnosis present

## 2018-10-05 DIAGNOSIS — R609 Edema, unspecified: Secondary | ICD-10-CM | POA: Diagnosis not present

## 2018-10-05 DIAGNOSIS — D649 Anemia, unspecified: Secondary | ICD-10-CM | POA: Diagnosis present

## 2018-10-05 DIAGNOSIS — Z9119 Patient's noncompliance with other medical treatment and regimen: Secondary | ICD-10-CM

## 2018-10-05 DIAGNOSIS — N185 Chronic kidney disease, stage 5: Secondary | ICD-10-CM | POA: Diagnosis present

## 2018-10-05 DIAGNOSIS — F22 Delusional disorders: Secondary | ICD-10-CM | POA: Diagnosis not present

## 2018-10-05 DIAGNOSIS — Z9111 Patient's noncompliance with dietary regimen: Secondary | ICD-10-CM

## 2018-10-05 LAB — BASIC METABOLIC PANEL
Anion gap: 10 (ref 5–15)
BUN: 70 mg/dL — AB (ref 6–20)
CO2: 18 mmol/L — ABNORMAL LOW (ref 22–32)
Calcium: 9.2 mg/dL (ref 8.9–10.3)
Chloride: 106 mmol/L (ref 98–111)
Creatinine, Ser: 7.09 mg/dL — ABNORMAL HIGH (ref 0.44–1.00)
GFR calc Af Amer: 7 mL/min — ABNORMAL LOW (ref >60)
GFR calc non Af Amer: 6 mL/min — ABNORMAL LOW (ref >60)
Glucose, Bld: 309 mg/dL — ABNORMAL HIGH (ref 70–99)
Potassium: 4.3 mmol/L (ref 3.5–5.1)
Sodium: 134 mmol/L — ABNORMAL LOW (ref 135–145)

## 2018-10-05 LAB — CBC
HEMATOCRIT: 32.7 % — AB (ref 36.0–46.0)
Hemoglobin: 10.2 g/dL — ABNORMAL LOW (ref 12.0–15.0)
MCH: 26.1 pg (ref 26.0–34.0)
MCHC: 31.2 g/dL (ref 30.0–36.0)
MCV: 83.6 fL (ref 80.0–100.0)
Platelets: 281 10*3/uL (ref 150–400)
RBC: 3.91 MIL/uL (ref 3.87–5.11)
RDW: 14.6 % (ref 11.5–15.5)
WBC: 7.5 10*3/uL (ref 4.0–10.5)
nRBC: 0 % (ref 0.0–0.2)

## 2018-10-05 LAB — APTT: aPTT: 31 seconds (ref 24–36)

## 2018-10-05 LAB — PROTIME-INR
INR: 1.04
Prothrombin Time: 13.5 seconds (ref 11.4–15.2)

## 2018-10-05 LAB — TROPONIN I: Troponin I: <0.03 ng/mL (ref <0.03)

## 2018-10-05 MED ORDER — ONDANSETRON HCL 4 MG/2ML IJ SOLN: 4.0000 mg | Freq: Four times a day (QID) | INTRAMUSCULAR | Status: DC | PRN

## 2018-10-05 MED ORDER — ATORVASTATIN CALCIUM 40 MG PO TABS
40.0000 mg | ORAL_TABLET | Freq: Every day | ORAL | Status: DC
  Administered 2018-10-06 – 2018-10-15 (×10): 40 mg via ORAL
  Filled 2018-10-05 (×10): qty 1

## 2018-10-05 MED ORDER — INSULIN ASPART 100 UNIT/ML ~~LOC~~ SOLN: 0.0000 [IU] | Freq: Three times a day (TID) | SUBCUTANEOUS | Status: DC

## 2018-10-05 MED ORDER — BENZTROPINE MESYLATE 2 MG PO TABS
2.0000 mg | ORAL_TABLET | Freq: Two times a day (BID) | ORAL | Status: DC
  Administered 2018-10-06 – 2018-10-16 (×20): 2 mg via ORAL
  Filled 2018-10-05 (×23): qty 1

## 2018-10-05 MED ORDER — METOPROLOL TARTRATE 50 MG PO TABS
50.0000 mg | ORAL_TABLET | Freq: Two times a day (BID) | ORAL | Status: DC
  Administered 2018-10-06 – 2018-10-16 (×18): 50 mg via ORAL
  Filled 2018-10-05 (×20): qty 1

## 2018-10-05 MED ORDER — LEVETIRACETAM 500 MG PO TABS
500.0000 mg | ORAL_TABLET | Freq: Two times a day (BID) | ORAL | Status: DC
  Administered 2018-10-06 – 2018-10-10 (×10): 500 mg via ORAL
  Filled 2018-10-05 (×12): qty 1

## 2018-10-05 MED ORDER — AMLODIPINE BESYLATE 10 MG PO TABS
10.0000 mg | ORAL_TABLET | Freq: Every day | ORAL | Status: DC
  Administered 2018-10-06 – 2018-10-16 (×10): 10 mg via ORAL
  Filled 2018-10-05 (×10): qty 1

## 2018-10-05 MED ORDER — HEPARIN BOLUS VIA INFUSION
4500.0000 [IU] | Freq: Once | INTRAVENOUS | Status: AC
  Administered 2018-10-05: 4500 [IU] via INTRAVENOUS
  Filled 2018-10-05: qty 4500

## 2018-10-05 MED ORDER — FERROUS FUMARATE 324 (106 FE) MG PO TABS
1.0000 | ORAL_TABLET | Freq: Every day | ORAL | Status: DC
  Administered 2018-10-06 – 2018-10-10 (×5): 106 mg via ORAL
  Filled 2018-10-05 (×5): qty 1

## 2018-10-05 MED ORDER — ACETAMINOPHEN 650 MG RE SUPP: 650.0000 mg | Freq: Four times a day (QID) | RECTAL | Status: DC | PRN

## 2018-10-05 MED ORDER — GABAPENTIN 100 MG PO CAPS
100.0000 mg | ORAL_CAPSULE | Freq: Two times a day (BID) | ORAL | Status: DC
  Administered 2018-10-06 – 2018-10-07 (×4): 100 mg via ORAL
  Filled 2018-10-05 (×4): qty 1

## 2018-10-05 MED ORDER — INFLUENZA VAC SPLIT QUAD 0.5 ML IM SUSY
0.5000 mL | PREFILLED_SYRINGE | INTRAMUSCULAR | Status: AC
  Administered 2018-10-06: 0.5 mL via INTRAMUSCULAR
  Filled 2018-10-05: qty 0.5

## 2018-10-05 MED ORDER — HEPARIN (PORCINE) 25000 UT/250ML-% IV SOLN
1300.0000 [IU]/h | INTRAVENOUS | Status: DC
  Administered 2018-10-05: 1300 [IU]/h via INTRAVENOUS
  Filled 2018-10-05 (×2): qty 250

## 2018-10-05 MED ORDER — ACETAMINOPHEN 325 MG PO TABS
650.0000 mg | ORAL_TABLET | Freq: Four times a day (QID) | ORAL | Status: DC | PRN
  Administered 2018-10-06 – 2018-10-15 (×8): 650 mg via ORAL
  Filled 2018-10-05 (×7): qty 2

## 2018-10-05 MED ORDER — ONDANSETRON HCL 4 MG PO TABS
4.0000 mg | ORAL_TABLET | Freq: Four times a day (QID) | ORAL | Status: DC | PRN
  Administered 2018-10-14: 4 mg via ORAL
  Filled 2018-10-05: qty 1

## 2018-10-05 MED ORDER — INSULIN GLARGINE 100 UNIT/ML ~~LOC~~ SOLN
20.0000 [IU] | Freq: Every day | SUBCUTANEOUS | Status: DC
  Administered 2018-10-06 – 2018-10-07 (×3): 20 [IU] via SUBCUTANEOUS
  Filled 2018-10-05 (×3): qty 0.2
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
## 2018-10-05 NOTE — ED Notes (Signed)
[facility] called for transport. 

## 2018-10-05 NOTE — ED Notes (Addendum)
Unsuccessful IV attempt x1. Patient refusing attempts in hand. Charge RN asked to attempt.

## 2018-10-05 NOTE — ED Notes (Signed)
Report given to [facility]. 

## 2018-10-05 NOTE — ED Notes (Signed)
Hospitalist at bedside 
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
## 2018-10-05 NOTE — Progress Notes (Signed)
Right lower extremity venous duplex exam completed. Positive finding of acute deep vein thrombosis involving right femoral vein mid to distal segments.   Posterior tibial and peroneal veins with very limited visibility when compress, study cannot completely exclude the possible existence of thrombosis at calf veins even though flow detected.  Result called and discussed with Dr. Vernie Shanks and she is aware of this. More details please see preliminary notes on CV PROC under chart review.  Damiana Berrian H Beryle Bagsby(RDMS RVT) 10/05/18 7:45 PM

## 2018-10-05 NOTE — ED Notes (Signed)
Main phlebotomy asked to attempt blood draw.
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
## 2018-10-05 NOTE — ED Notes (Signed)
Bed: WA08 Expected date:  Expected time:  Means of arrival:  Comments: EMS hyperglycemia 

## 2018-10-05 NOTE — ED Notes (Signed)
IV team at bedside 

## 2018-10-05 NOTE — ED Notes (Signed)
ED TO INPATIENT HANDOFF REPORT  Name/Age/Gender Amber Lara 57 y.o. female  Code Status Code Status History    Date Active Date Inactive Code Status Order ID Comments User Context   09/21/2018 0642 09/21/2018 1257 Full Code [id number]  Orpah Greek, MD ED   06/02/2018 1200 06/08/2018 1223 Full Code [id number]  Amber Deutscher, MD ED   08/03/2017 1111 08/04/2017 1809 Full Code [id number]  Amber Mocha, MD Inpatient   07/14/2017 0830 07/15/2017 1948 Full Code [id number]  Amber Gravel, MD ED   06/23/2017 0248 06/23/2017 2016 Full Code [id number]  Amber Patience, MD ED      Home/SNF/Other Home  Chief Complaint Hyperglycemia  Level of Care/Admitting Diagnosis ED Disposition    ED Disposition Condition [facility]: [place] [100100]  Level of Care: Medical Telemetry [104]  I expect the patient will be discharged within 24 hours: No (not a candidate for 5C-Observation unit)  Diagnosis: Chest pain [433295]  Admitting Physician: Amber Lara [phone]  Attending Physician: Amber Lara [phone]  PT Class (Do Not Modify): Observation [104]  PT Acc Code (Do Not Modify): Observation [10022]       Medical History Past Medical History:  Diagnosis Date  . Arthritis   . Chronic anemia   . CKD (chronic kidney disease), stage V ([place])   . Diabetes mellitus ([place])   . History of stroke   . Hypertension   . Obesity   . Psychosis ([place])    a. prior adm in [place] ([place]) in 2015 where pt was admitted with hallucinations, seeing men in black outfits, family members on a non-existent camera, hearing voices and seeing animals - UDS positive for methamphetamines at that time and patient stated she believed someone had put this in her body.  . Seizures ([facility])     Allergies Allergies  Allergen Reactions  . Penicillins Anaphylaxis and Hives    Has patient had a PCN reaction causing immediate rash,  facial/tongue/throat swelling, SOB or lightheadedness with hypotension: yes Has patient had a PCN reaction causing severe rash involving mucus membranes or skin n24} If all of the above answers are "NO", then may proceed with Cephalosporin use.ecrosis: no Has patient had a PCN reaction that required hospitalization: no Has patient had a PCN reaction occurring within the last 10 years:no  . Tetracyclines & Related Anaphylaxis and Hives  . Morphine And Related Hives  . Amoxicillin     Same as penicillin  . Latex Itching and Rash  . Tape Itching and Rash    IV Location/Drains/Wounds Patient Lines/Drains/Airways Status   Active Line/Drains/Airways    Name:   Placement date:   Placement time:   Site:   Days:   Fistula / Graft Right Forearm Arteriovenous fistula   06/07/18    1036    Forearm   120   Incision (Closed) 06/07/18 Arm Right   06/07/18    1114     120          Labs/Imaging Results for orders placed or performed during the hospital encounter of 10/05/18 (from the past 48 hour(s))  Basic metabolic panel     Status: Abnormal   Collection Time: 10/05/18  7:10 PM  Result Value Ref Range   Sodium 134 (L) 135 - 145 mmol/L   Potassium 4.3 3.5 - 5.1 mmol/L   Chloride 106 98 - 111 mmol/L   CO2 18 (L) 22 - 32 mmol/L  Glucose, Bld 309 (H) 70 - 99 mg/dL   BUN 70 (H) 6 - 20 mg/dL   Creatinine, Ser 7.09 (H) 0.44 - 1.00 mg/dL   Calcium 9.2 8.9 - 10.3 mg/dL   GFR calc non Af Amer 6 (L) >60 mL/min   GFR calc Af Amer 7 (L) >60 mL/min   Anion gap 10 5 - 15    Comment: Performed at [facility], [place] [address], [place], [place] [address]  CBC     Status: Abnormal   Collection Time: 10/05/18  7:10 PM  Result Value Ref Range   WBC 7.5 4.0 - 10.5 K/uL   RBC 3.91 3.87 - 5.11 MIL/uL   Hemoglobin 10.2 (L) 12.0 - 15.0 g/dL   HCT 32.7 (L) 36.0 - 46.0 %   MCV 83.6 80.0 - 100.0 fL   MCH 26.1 26.0 - 34.0 pg   MCHC 31.2 30.0 - 36.0 g/dL   RDW 14.6 11.5 - 15.5 %   Platelets  281 150 - 400 K/uL   nRBC 0.0 0.0 - 0.2 %    Comment: Performed at [facility], [place] [address], [place], [place] [address]  Troponin I - Add-On to previous collection     Status: None   Collection Time: 10/05/18  7:10 PM  Result Value Ref Range   Troponin I <0.03 <0.03 ng/mL    Comment: Performed at [facility], [place] [address] [place], [place] [address]   Dg Chest 2 View  Result Date: 10/05/2018 CLINICAL DATA:  Chest pain EXAM: CHEST - 2 VIEW COMPARISON:  08/14/2018 FINDINGS: The heart size and mediastinal contours are within normal limits. Both lungs are clear. The visualized skeletal structures are unremarkable. IMPRESSION: No active cardiopulmonary disease. Electronically Signed   By: Ashley Royalty M.D.   On: 10/05/2018 18:24   Vas [place] Lower Extremity Venous (dvt) (mc And Wl 7a-7p)  Result Date: 10/05/2018  Lower Venous Study Indications: Pain, Swelling, and Edema.  Limitations: Body habitus and severe pain. Comparison Study: No prior study available Performing Technologist: Lorina Rabon  Examination Guidelines: A complete evaluation includes B-mode imaging, spectral Doppler, color Doppler, and power Doppler as needed of all accessible portions of each vessel. Bilateral testing is considered an integral part of a complete examination. Limited examinations for reoccurring indications may be performed as noted.  Right Venous Findings: +---------+---------------+---------+-----------+---------------+--------------+          CompressibilityPhasicitySpontaneityProperties     Summary        +---------+---------------+---------+-----------+---------------+--------------+ CFV      Full           Yes      Yes                                      +---------+---------------+---------+-----------+---------------+--------------+ SFJ      Full                                                              +---------+---------------+---------+-----------+---------------+--------------+ FV Prox  Full                                                             +---------+---------------+---------+-----------+---------------+--------------+  FV Mid   Partial                 Yes        partially      Acute                                                      re-cannalized                 +---------+---------------+---------+-----------+---------------+--------------+ FV DistalPartial                 Yes        partially      Acute                                                      re-cannalized                 +---------+---------------+---------+-----------+---------------+--------------+ PFV      Full                                                             +---------+---------------+---------+-----------+---------------+--------------+ POP      Full           Yes      Yes                                      +---------+---------------+---------+-----------+---------------+--------------+ PTV                              Yes                       poor                                                                      visibility                                                                with                                                                      compression    +---------+---------------+---------+-----------+---------------+--------------+  PERO                                                       not well                                                                  visualized     +---------+---------------+---------+-----------+---------------+--------------+  Left Venous Findings: +---+---------------+---------+-----------+----------+-------+    CompressibilityPhasicitySpontaneityPropertiesSummary +---+---------------+---------+-----------+----------+-------+ CFVFull           Yes      Yes                           +---+---------------+---------+-----------+----------+-------+    Summary: Right: Findings consistent with acute deep vein thrombosis involving the right femoral vein. No cystic structure found in the popliteal fossa. Posterior tibial and peroneal veins with very limited visibility when compress, study cannot completely exclude  the possible existence of thrombosis at calf veins even though flow detected. Left: No evidence of common femoral vein obstruction.  *See table(s) above for measurements and observations.    Preliminary    None  Pending Labs Unresulted Labs (From admission, onward)    Start     Ordered   10/06/18 0500  CBC  Daily,   R     10/05/18 2025   10/05/18 2013  Protime-INR  Add-on,   R     10/05/18 2012   10/05/18 2013  APTT  Add-on,   R     10/05/18 2012          Vitals/Pain Today's Vitals   10/05/18 1647 10/05/18 1935 10/05/18 2005  BP: (!) 161/80 (!) 178/70   Pulse: 78 72   Resp: 18 17   SpO2: 100% 100%   Weight:   99.8 kg  Height:   5\' 3"  (1.6 m)  PainSc: 7       Isolation Precautions No active isolations  Medications Medications  heparin bolus via infusion 4,500 Units (has no administration in time range)  heparin ADULT infusion 100 units/mL (25000 units/259mL sodium chloride 0.45%) (has no administration in time range)  Influenza vac split quadrivalent PF (FLUARIX) injection 0.5 mL (has no administration in time range)    Mobility walks

## 2018-10-05 NOTE — Progress Notes (Signed)
ANTICOAGULATION CONSULT NOTE - Initial Consult  Pharmacy Consult for IV heparin Indication: DVT  Allergies  Allergen Reactions  . Penicillins Anaphylaxis and Hives    Has patient had a PCN reaction causing immediate rash, facial/tongue/throat swelling, SOB or lightheadedness with hypotension: yes Has patient had a PCN reaction causing severe rash involving mucus membranes or skin n24} If all of the above answers are "NO", then may proceed with Cephalosporin use.ecrosis: no Has patient had a PCN reaction that required hospitalization: no Has patient had a PCN reaction occurring within the last 10 years:no  . Tetracyclines & Related Anaphylaxis and Hives  . Morphine And Related Hives  . Amoxicillin     Same as penicillin  . Latex Itching and Rash  . Tape Itching and Rash    Patient Measurements: Height: 5\' 3"  (160 cm) Weight: 220 lb (99.8 kg) IBW/kg (Calculated) : 52.4 Heparin Dosing Weight: 75.5 kg  Vital Signs: BP: 178/70 (12/27 1935) Pulse Rate: 72 (12/27 1935)  Labs: Recent Labs    10/05/18 1910  HGB 10.2*  HCT 32.7*  PLT 281  CREATININE 7.09*  TROPONINI <0.03    Estimated Creatinine Clearance: 10 mL/min (A) (by C-G formula based on SCr of 7.09 mg/dL (H)).   Medical History: Past Medical History:  Diagnosis Date  . Arthritis   . Chronic anemia   . CKD (chronic kidney disease), stage V ([place])   . Diabetes mellitus ([place])   . History of stroke   . Hypertension   . Obesity   . Psychosis ([place])    a. prior adm in [place] ([place]) in 2015 where pt was admitted with hallucinations, seeing men in black outfits, family members on a non-existent camera, hearing voices and seeing animals - UDS positive for methamphetamines at that time and patient stated she believed someone had put this in her body.  . Seizures ([place])     Medications:  Scheduled:  . heparin  4,500 Units Intravenous Once  . [START ON 10/06/2018] Influenza vac split quadrivalent PF   0.5 mL Intramuscular Tomorrow-1000  . injection device for insulin   Other Once    Assessment: Pharmacy is consulted to dose heparin in 57 yo female diagnosed with DVT. Venous duplex exam showed positive finding of acute deep vein thrombosis involving right femoral vein mid to distal segments.   Posterior tibial and peroneal veins with very limited visibility when compress, study cannot completely exclude the possible existence of thrombosis at calf veins even though flow detected.  No PTA anticoagulation listed.   Today, 10/05/18   Scr is 7.09   Hgb 10.2, Hct 32.7   Plt 281  PT 13.5, INR 1.04    Goal of Therapy:  Heparin level 0.3-0.7 units/ml Monitor platelets by anticoagulation protocol: Yes   Plan:   Heparin 4500 unit bolus followed by heparin 1300 units/hr  HL 8 hours after start of drip  Daily CBC  Monitor for signs and symptoms of bleeding    Royetta Asal, PharmD, BCPS Pager [phone] 10/05/2018 9:12 PM

## 2018-10-05 NOTE — ED Notes (Signed)
Patient transported to X-ray 

## 2018-10-06 ENCOUNTER — Observation Stay (HOSPITAL_COMMUNITY): Payer: Medicare Other

## 2018-10-06 DIAGNOSIS — R569 Unspecified convulsions: Secondary | ICD-10-CM

## 2018-10-06 DIAGNOSIS — N185 Chronic kidney disease, stage 5: Secondary | ICD-10-CM | POA: Diagnosis not present

## 2018-10-06 DIAGNOSIS — I10 Essential (primary) hypertension: Secondary | ICD-10-CM

## 2018-10-06 DIAGNOSIS — I82411 Acute embolism and thrombosis of right femoral vein: Secondary | ICD-10-CM | POA: Diagnosis not present

## 2018-10-06 LAB — HEPATIC FUNCTION PANEL
ALK PHOS: 99 U/L (ref 38–126)
ALT: 9 U/L (ref 0–44)
AST: 10 U/L — ABNORMAL LOW (ref 15–41)
Albumin: 2.2 g/dL — ABNORMAL LOW (ref 3.5–5.0)
Bilirubin, Direct: <0.1 mg/dL (ref 0.0–0.2)
Total Bilirubin: 0.3 mg/dL (ref 0.3–1.2)
Total Protein: 5.4 g/dL — ABNORMAL LOW (ref 6.5–8.1)

## 2018-10-06 LAB — BASIC METABOLIC PANEL
ANION GAP: 12 (ref 5–15)
BUN: 62 mg/dL — ABNORMAL HIGH (ref 6–20)
CO2: 16 mmol/L — ABNORMAL LOW (ref 22–32)
Calcium: 8.6 mg/dL — ABNORMAL LOW (ref 8.9–10.3)
Chloride: 106 mmol/L (ref 98–111)
Creatinine, Ser: 6.66 mg/dL — ABNORMAL HIGH (ref 0.44–1.00)
GFR calc Af Amer: 7 mL/min — ABNORMAL LOW (ref >60)
GFR calc non Af Amer: 6 mL/min — ABNORMAL LOW (ref >60)
GLUCOSE: 265 mg/dL — AB (ref 70–99)
Potassium: 3.8 mmol/L (ref 3.5–5.1)
Sodium: 134 mmol/L — ABNORMAL LOW (ref 135–145)

## 2018-10-06 LAB — URINALYSIS, ROUTINE W REFLEX MICROSCOPIC
Bilirubin Urine: NEGATIVE
Glucose, UA: >=500 mg/dL — AB
Ketones, ur: NEGATIVE mg/dL
LEUKOCYTES UA: NEGATIVE
Nitrite: NEGATIVE
Specific Gravity, Urine: 1.01 (ref 1.005–1.030)
pH: 5 (ref 5.0–8.0)

## 2018-10-06 LAB — PHOSPHORUS: Phosphorus: 4.9 mg/dL — ABNORMAL HIGH (ref 2.5–4.6)

## 2018-10-06 LAB — CBC
HCT: 27.6 % — ABNORMAL LOW (ref 36.0–46.0)
Hemoglobin: 9 g/dL — ABNORMAL LOW (ref 12.0–15.0)
MCH: 26.2 pg (ref 26.0–34.0)
MCHC: 32.6 g/dL (ref 30.0–36.0)
MCV: 80.2 fL (ref 80.0–100.0)
Platelets: 245 10*3/uL (ref 150–400)
RBC: 3.44 MIL/uL — ABNORMAL LOW (ref 3.87–5.11)
RDW: 14.5 % (ref 11.5–15.5)
WBC: 6.8 10*3/uL (ref 4.0–10.5)
nRBC: 0 % (ref 0.0–0.2)

## 2018-10-06 LAB — GLUCOSE, CAPILLARY
Glucose-Capillary: 137 mg/dL — ABNORMAL HIGH (ref 70–99)
Glucose-Capillary: 226 mg/dL — ABNORMAL HIGH (ref 70–99)
Glucose-Capillary: 249 mg/dL — ABNORMAL HIGH (ref 70–99)
Glucose-Capillary: 285 mg/dL — ABNORMAL HIGH (ref 70–99)
Glucose-Capillary: 450 mg/dL — ABNORMAL HIGH (ref 70–99)
Glucose-Capillary: 546 mg/dL (ref 70–99)

## 2018-10-06 LAB — TROPONIN I
Troponin I: <0.03 ng/mL (ref <0.03)
Troponin I: <0.03 ng/mL (ref <0.03)
Troponin I: <0.03 ng/mL (ref <0.03)

## 2018-10-06 LAB — HEPARIN LEVEL (UNFRACTIONATED)
Heparin Unfractionated: 0.55 IU/mL (ref 0.30–0.70)
Heparin Unfractionated: 0.67 IU/mL (ref 0.30–0.70)

## 2018-10-06 LAB — HIV ANTIBODY (ROUTINE TESTING W REFLEX): HIV Screen 4th Generation wRfx: NONREACTIVE

## 2018-10-06 MED ORDER — TECHNETIUM TC 99M DIETHYLENETRIAME-PENTAACETIC ACID
31.1000 | Freq: Once | INTRAVENOUS | Status: AC | PRN
  Administered 2018-10-06: 31.1 via RESPIRATORY_TRACT

## 2018-10-06 MED ORDER — INSULIN ASPART 100 UNIT/ML ~~LOC~~ SOLN
10.0000 [IU] | Freq: Once | SUBCUTANEOUS | Status: AC
  Administered 2018-10-06: 10 [IU] via SUBCUTANEOUS

## 2018-10-06 MED ORDER — FUROSEMIDE 10 MG/ML IJ SOLN
80.0000 mg | Freq: Once | INTRAMUSCULAR | Status: AC
  Administered 2018-10-06: 80 mg via INTRAVENOUS
  Filled 2018-10-06: qty 8

## 2018-10-06 MED ORDER — TECHNETIUM TO 99M ALBUMIN AGGREGATED
4.1800 | Freq: Once | INTRAVENOUS | Status: AC | PRN
  Administered 2018-10-06: 4.18 via INTRAVENOUS

## 2018-10-06 MED ORDER — INSULIN ASPART 100 UNIT/ML ~~LOC~~ SOLN
0.0000 [IU] | Freq: Three times a day (TID) | SUBCUTANEOUS | Status: DC
  Administered 2018-10-06: 1 [IU] via SUBCUTANEOUS
  Administered 2018-10-06: 5 [IU] via SUBCUTANEOUS
  Administered 2018-10-07: 2 [IU] via SUBCUTANEOUS
  Administered 2018-10-07: 5 [IU] via SUBCUTANEOUS
  Administered 2018-10-07: 3 [IU] via SUBCUTANEOUS
  Administered 2018-10-08: 7 [IU] via SUBCUTANEOUS
  Administered 2018-10-08 – 2018-10-09 (×3): 3 [IU] via SUBCUTANEOUS
  Administered 2018-10-09: 2 [IU] via SUBCUTANEOUS
  Administered 2018-10-10: 3 [IU] via SUBCUTANEOUS
  Administered 2018-10-10: 1 [IU] via SUBCUTANEOUS
  Administered 2018-10-11: 5 [IU] via SUBCUTANEOUS
  Administered 2018-10-12: 1 [IU] via SUBCUTANEOUS
  Administered 2018-10-12: 2 [IU] via SUBCUTANEOUS
  Administered 2018-10-13: 3 [IU] via SUBCUTANEOUS
  Administered 2018-10-13 – 2018-10-14 (×2): 2 [IU] via SUBCUTANEOUS
  Administered 2018-10-14: 1 [IU] via SUBCUTANEOUS
  Administered 2018-10-15: 9 [IU] via SUBCUTANEOUS
  Administered 2018-10-16: 1 [IU] via SUBCUTANEOUS

## 2018-10-06 MED ORDER — SODIUM BICARBONATE 650 MG PO TABS
650.0000 mg | ORAL_TABLET | Freq: Three times a day (TID) | ORAL | Status: DC
  Administered 2018-10-06 – 2018-10-07 (×4): 650 mg via ORAL
  Filled 2018-10-06 (×3): qty 1

## 2018-10-06 MED ORDER — APIXABAN 5 MG PO TABS: 5.0000 mg | ORAL_TABLET | Freq: Two times a day (BID) | ORAL | Status: DC

## 2018-10-06 MED ORDER — GUAIFENESIN-DM 100-10 MG/5ML PO SYRP
5.0000 mL | ORAL_SOLUTION | ORAL | Status: DC | PRN
  Administered 2018-10-06 – 2018-10-14 (×4): 5 mL via ORAL
  Filled 2018-10-06 (×5): qty 5

## 2018-10-06 MED ORDER — APIXABAN 5 MG PO TABS
10.0000 mg | ORAL_TABLET | Freq: Two times a day (BID) | ORAL | Status: DC
  Administered 2018-10-06 – 2018-10-08 (×4): 10 mg via ORAL
  Filled 2018-10-06 (×5): qty 2

## 2018-10-06 NOTE — Progress Notes (Signed)
Patient is very sleepy MD aware upon rounds vitals stable. Going for VQ Scan.

## 2018-10-06 NOTE — Plan of Care (Signed)

## 2018-10-06 NOTE — Progress Notes (Addendum)
[facility] PROGRESS NOTE  Amber Lara GYI:[id number] DOB: [birth year] DOA: 10/05/2018  PCP: Patient, No Pcp Per  Brief History/Interval Summary: 57 y.o. female with history of chronic kidney disease stage V has a right upper extremity AV fistula, essential hypertension, anemia, diabetes mellitus type 2, psychosis presented to the emergency department with complaints of chest pain and pain in the right lower extremity.  She was found to have a right lower extremity DVT.  She was hospitalized for further management.  She was also found to have worsening renal function.  She has been noncompliant with her medications and helps not taking them in a long time.  Reason for Visit: Acute DVT.  Acute on chronic kidney disease stage V  Consultants: None  Procedures:   Lower extremity Doppler  Positive for acute DVT   Antibiotics: None  Subjective/Interval History: Patient was very somnolent this morning when I initially evaluated her.  Patient was reevaluated after couple of hours.  She was fully awake.  She denies any chest pain.  Some shortness of breath.  No wheezing.  No cough.  Does have pain in her right leg.  Denies any road trips or air travel recently.  ROS: Denies any headaches  Objective:  Vital Signs  Vitals:   10/05/18 2208 10/06/18 0037 10/06/18 0509 10/06/18 0804  BP: (!) 187/87  (!) 134/55 136/61  Pulse: 79  68 67  Resp: 18  18 18   Temp: 98.3 F (36.8 C)  98 F (36.7 C)   TempSrc: Oral     SpO2: 91%  100% 100%  Weight:  97.2 kg    Height:  5\' 3"  (1.6 m)      Intake/Output Summary (Last 24 hours) at 10/06/2018 1032 Last data filed at 10/06/2018 1023 Gross per 24 hour  Intake 807.68 ml  Output 100 ml  Net 707.68 ml   Filed Weights   10/05/18 2005 10/06/18 0037  Weight: 99.8 kg 97.2 kg    General appearance: alert, cooperative, appears stated age and no distress Head: Normocephalic, without obvious abnormality, atraumatic Resp: clear to  auscultation bilaterally Cardio: regular rate and rhythm, S1, S2 normal, no murmur, click, rub or gallop GI: soft, non-tender; bowel sounds normal; no masses,  no organomegaly Extremities: Right lower extremity is noted to be larger than the left.  Edema is present. Pulses: 2+ and symmetric Neurologic: No focal deficits.  Alert and oriented x3.  Lab Results:  Data Reviewed: I have personally reviewed following labs and imaging studies  CBC: Recent Labs  Lab 10/05/18 1910 10/06/18 0459  WBC 7.5 6.8  HGB 10.2* 9.0*  HCT 32.7* 27.6*  MCV 83.6 80.2  PLT 281 350    Basic Metabolic Panel: Recent Labs  Lab 10/05/18 1910 10/06/18 0459  NA 134* 134*  K 4.3 3.8  CL 106 106  CO2 18* 16*  GLUCOSE 309* 265*  BUN 70* 62*  CREATININE 7.09* 6.66*  CALCIUM 9.2 8.6*  PHOS  --  4.9*    GFR: Estimated Creatinine Clearance: 10.5 mL/min (A) (by C-G formula based on SCr of 6.66 mg/dL (H)).  Liver Function Tests: Recent Labs  Lab 10/06/18 0459  AST 10*  ALT 9  ALKPHOS 99  BILITOT 0.3  PROT 5.4*  ALBUMIN 2.2*    Coagulation Profile: Recent Labs  Lab 10/05/18 2019  INR 1.04    Cardiac Enzymes: Recent Labs  Lab 10/05/18 1910 10/06/18 0042 10/06/18 0459  TROPONINI <0.03 <0.03 <0.03    CBG: Recent Labs  Lab 10/06/18 0031 10/06/18 0756  GLUCAP 285* 226*      Radiology Studies: Dg Chest 2 View  Result Date: 10/05/2018 CLINICAL DATA:  Chest pain EXAM: CHEST - 2 VIEW COMPARISON:  08/14/2018 FINDINGS: The heart size and mediastinal contours are within normal limits. Both lungs are clear. The visualized skeletal structures are unremarkable. IMPRESSION: No active cardiopulmonary disease. Electronically Signed   By: Ashley Royalty M.D.   On: 10/05/2018 18:24   Nm Pulmonary Vent And Perf (v/q Scan)  Result Date: 10/06/2018 CLINICAL DATA:  57 year old admitted yesterday for chest pain and RIGHT LOWER extremity pain and swelling. Chronic renal insufficiency precluded IV  contrast for CTA chest. EXAM: NUCLEAR MEDICINE VENTILATION - PERFUSION LUNG SCAN TECHNIQUE: Ventilation images were obtained in multiple projections using inhaled aerosol Tc-59m DTPA. Perfusion images were obtained in multiple projections after intravenous injection of Tc-70m MAA. RADIOPHARMACEUTICALS:  31.1 mCi of Tc-78m DTPA aerosol inhalation and 4.2 mCi Tc56m MAA IV COMPARISON:  No prior nuclear imaging. Chest x-ray yesterday is correlated. FINDINGS: Ventilation: No focal ventilation defect. Aerosol deposition in the trachea is noted. Perfusion: Normal pulmonary perfusion. No perfusion defects in either lung. IMPRESSION: Normal examination. Electronically Signed   By: Evangeline Dakin M.D.   On: 10/06/2018 10:13   Ct Renal Stone Study  Result Date: 10/06/2018 CLINICAL DATA:  Acute onset of flank pain. EXAM: CT ABDOMEN AND PELVIS WITHOUT CONTRAST TECHNIQUE: Multidetector CT imaging of the abdomen and pelvis was performed following the standard protocol without IV contrast. COMPARISON:  CT of the abdomen and pelvis from 07/15/2018 FINDINGS: Lower chest: Mild bibasilar atelectasis is noted. The visualized portions of the mediastinum are unremarkable. Hepatobiliary: The liver is unremarkable in appearance. The gallbladder is unremarkable in appearance. The common bile duct remains normal in caliber. Pancreas: The pancreas is within normal limits. Spleen: The spleen is unremarkable in appearance. Adrenals/Urinary Tract: The adrenal glands are unremarkable in appearance. No significant hydronephrosis is seen. There is suggestion of mild wall thickening at the left renal pelvis, which could reflect mild ureteritis. Mild nonspecific perinephric stranding is noted bilaterally. No renal or ureteral stones are identified. Stomach/Bowel: The stomach is unremarkable in appearance. The small bowel is within normal limits. The appendix is normal in caliber, without evidence of appendicitis. The colon is unremarkable in  appearance. Vascular/Lymphatic: The abdominal aorta is unremarkable in appearance. The inferior vena cava is grossly unremarkable. No retroperitoneal lymphadenopathy is seen. No pelvic sidewall lymphadenopathy is identified. Reproductive: The bladder is mildly distended and grossly unremarkable. The patient is status post hysterectomy. No suspicious adnexal masses are seen. Other: No additional soft tissue abnormalities are seen. Musculoskeletal: No acute osseous abnormalities are identified. The visualized musculature is unremarkable in appearance. IMPRESSION: 1. Suggestion of mild wall thickening at the left renal pelvis, which could reflect mild ureteritis. No evidence of hydronephrosis. 2. Mild bibasilar atelectasis noted. Electronically Signed   By: Garald Balding M.D.   On: 10/06/2018 03:46   Vas [place] Lower Extremity Venous (dvt) (mc And Wl 7a-7p)  Result Date: 10/05/2018  Lower Venous Study Indications: Pain, Swelling, and Edema.  Limitations: Body habitus and severe pain. Comparison Study: No prior study available Performing Technologist: Lorina Rabon  Examination Guidelines: A complete evaluation includes B-mode imaging, spectral Doppler, color Doppler, and power Doppler as needed of all accessible portions of each vessel. Bilateral testing is considered an integral part of a complete examination. Limited examinations for reoccurring indications may be performed as noted.  Right Venous Findings: +---------+---------------+---------+-----------+---------------+--------------+  CompressibilityPhasicitySpontaneityProperties     Summary        +---------+---------------+---------+-----------+---------------+--------------+ CFV      Full           Yes      Yes                                      +---------+---------------+---------+-----------+---------------+--------------+ SFJ      Full                                                              +---------+---------------+---------+-----------+---------------+--------------+ FV Prox  Full                                                             +---------+---------------+---------+-----------+---------------+--------------+ FV Mid   Partial                 Yes        partially      Acute                                                      re-cannalized                 +---------+---------------+---------+-----------+---------------+--------------+ FV DistalPartial                 Yes        partially      Acute                                                      re-cannalized                 +---------+---------------+---------+-----------+---------------+--------------+ PFV      Full                                                             +---------+---------------+---------+-----------+---------------+--------------+ POP      Full           Yes      Yes                                      +---------+---------------+---------+-----------+---------------+--------------+ PTV                              Yes  poor                                                                      visibility                                                                with                                                                      compression    +---------+---------------+---------+-----------+---------------+--------------+ PERO                                                       not well                                                                  visualized     +---------+---------------+---------+-----------+---------------+--------------+  Left Venous Findings: +---+---------------+---------+-----------+----------+-------+    CompressibilityPhasicitySpontaneityPropertiesSummary +---+---------------+---------+-----------+----------+-------+ CFVFull           Yes      Yes                           +---+---------------+---------+-----------+----------+-------+    Summary: Right: Findings consistent with acute deep vein thrombosis involving the right femoral vein. No cystic structure found in the popliteal fossa. Posterior tibial and peroneal veins with very limited visibility when compress, study cannot completely exclude  the possible existence of thrombosis at calf veins even though flow detected. Left: No evidence of common femoral vein obstruction.  *See table(s) above for measurements and observations.    Preliminary      Medications:  Scheduled: . amLODipine  10 mg Oral Daily  . atorvastatin  40 mg Oral q1800  . benztropine  2 mg Oral BID  . Ferrous Fumarate  1 tablet Oral Daily  . gabapentin  100 mg Oral BID  . insulin aspart  0-9 Units Subcutaneous TID WC  . insulin glargine  20 Units Subcutaneous QHS  . levETIRAcetam  500 mg Oral BID  . metoprolol tartrate  50 mg Oral BID   Continuous: . heparin 1,300 Units/hr (10/05/18 2143)   FXT:[id number] **OR** acetaminophen, ondansetron **OR** ondansetron (ZOFRAN) IV    Assessment/Plan:  Chest pain and shortness of breath Concern was for acute pulmonary embolism due to newly diagnosed DVT.  VQ scan done this morning does not show any  evidence for PE.  Symptoms appear to have resolved.  No obvious ischemic changes on EKG.  Troponins are normal.  Continue to monitor for now.  Acute lower extremity DVT, right No obvious risk factors identified.  Patient denies any road trip or travel.  Perhaps he has been sedentary.  Patient started on IV heparin which will be continued for now.  Will need to decide on oral anticoagulation keeping CKD in mind.    Acute on Chronic kidney disease stage V with normal anion gap metabolic acidosis Creatinine noted to be worse compared to previous values.  She was between 4 and 5 few months ago.  7 at the time of admission.  Improved this morning.  She has been noncompliant  with her medication which is the likely reason for her worsening renal failure.  She was seen by nephrology a few months ago and had fistula placed in her right upper extremity.  Does have good bruit in that region.  No signs or symptoms of uremia.  Her potassium is normal.  No need for urgent dialysis currently.  We will put her back on Lasix.  Recheck her renal function and then have her follow-up with nephrology in the outpatient setting.  Left flank pain CT renal study was nonspecific.  There was a suggestion of ureteritis.  Patient denies any urinary symptoms except that she has been urinating less than usual.  We will hold off on antibiotics for now.  Essential hypertension Continue with the amlodipine and metoprolol.  Monitor blood pressures closely.  IV hydralazine as needed.  Diabetes mellitus type 2, uncontrolled HbA1c was 11.8 in August.  Apparently patient has not been taking her Lantus for a week since she ran out of this medication.  Continue lower dose of Lantus.  SSI.  Monitor CBGs.  Anemia of chronic kidney disease Monitor hemoglobin.  No evidence of overt bleeding  History of seizures Continue Keppra  History of psychosis Continue Cogentin.  Has been noted to have some paranoia recently.  Delusional thoughts.  May need to involve psychiatry if symptoms persist despite medications.  Hyperlipidemia Statins.   DVT Prophylaxis: On IV heparin    Code Status: Full code Family Communication: Discussed with the patient Disposition Plan: Management as outlined above    LOS: 0 days   Bonnielee Haff  [facility] Pager [phone] 10/06/2018, 10:32 AM  If 7PM-7AM, please contact night-coverage at [email or web address], password [facility]

## 2018-10-06 NOTE — Plan of Care (Signed)
  Problem: Activity: Goal: Capacity to carry out activities will improve Note:  Up to the chair and walking in the bathroom   Problem: Cardiac: Goal: Ability to achieve and maintain adequate cardiopulmonary perfusion will improve Note:  Warm dry and intact ECHO completed today   Problem: Activity: Goal: Risk for activity intolerance will decrease Note:  Foot shuffles    Problem: Elimination: Goal: Will not experience complications related to bowel motility Note:  Adequate elimination

## 2018-10-06 NOTE — Progress Notes (Addendum)
[place] for IV heparin Indication: DVT  Allergies  Allergen Reactions  . Penicillins Anaphylaxis and Hives    Has patient had a PCN reaction causing immediate rash, facial/tongue/throat swelling, SOB or lightheadedness with hypotension: yes Has patient had a PCN reaction causing severe rash involving mucus membranes or skin n24} If all of the above answers are "NO", then may proceed with Cephalosporin use.ecrosis: no Has patient had a PCN reaction that required hospitalization: no Has patient had a PCN reaction occurring within the last 10 years:no  . Tetracyclines & Related Anaphylaxis and Hives  . Morphine And Related Hives  . Amoxicillin     Same as penicillin  . Latex Itching and Rash  . Tape Itching and Rash    Patient Measurements: Height: 5\' 3"  (160 cm) Weight: 214 lb 4.8 oz (97.2 kg)(scale a) IBW/kg (Calculated) : 52.4 Heparin Dosing Weight: 75.5 kg  Vital Signs: Temp: 98 F (36.7 C) (12/28 0509) BP: 136/61 (12/28 0804) Pulse Rate: 67 (12/28 0804)  Labs: Recent Labs    10/05/18 1910 10/05/18 2019 10/06/18 0042 10/06/18 0459 10/06/18 0934  HGB 10.2*  --   --  9.0*  --   HCT 32.7*  --   --  27.6*  --   PLT 281  --   --  245  --   APTT  --  31  --   --   --   LABPROT  --  13.5  --   --   --   INR  --  1.04  --   --   --   HEPARINUNFRC  --   --   --  0.55 0.67  CREATININE 7.09*  --   --  6.66*  --   TROPONINI <0.03  --  <0.03 <0.03  --     Estimated Creatinine Clearance: 10.5 mL/min (A) (by C-G formula based on SCr of 6.66 mg/dL (H)).   Medical History: Past Medical History:  Diagnosis Date  . Arthritis   . Chronic anemia   . CKD (chronic kidney disease), stage V ([place])   . Diabetes mellitus ([place])   . History of stroke   . Hypertension   . Obesity   . Psychosis ([place])    a. prior adm in [place] ([place]) in 2015 where pt was admitted with hallucinations, seeing men in black outfits, family  members on a non-existent camera, hearing voices and seeing animals - UDS positive for methamphetamines at that time and patient stated she believed someone had put this in her body.  . Seizures ([place])     Medications:  Scheduled:  . amLODipine  10 mg Oral Daily  . atorvastatin  40 mg Oral q1800  . benztropine  2 mg Oral BID  . Ferrous Fumarate  1 tablet Oral Daily  . furosemide  80 mg Intravenous Once  . gabapentin  100 mg Oral BID  . insulin aspart  0-9 Units Subcutaneous TID WC  . insulin glargine  20 Units Subcutaneous QHS  . levETIRAcetam  500 mg Oral BID  . metoprolol tartrate  50 mg Oral BID  . sodium bicarbonate  650 mg Oral TID    Assessment: Pharmacy is consulted to dose heparin in 57 yo female diagnosed with DVT. Venous duplex exam showed positive finding of acute deep vein thrombosis involving right femoral vein mid to distal segments. VQ scan 12/28 AM did not show evidence for PE.  Heparin level therapeutic at 0.67,  but was drawn 4.5 hours after initial heparin level. No issues with s/sx of bleeding per nurse.   Goal of Therapy:  Heparin level 0.3-0.7 units/ml Monitor platelets by anticoagulation protocol: Yes   Plan:   Continue heparin at 1300 units/hr  Daily CBC, heparin level  Monitor for signs and symptoms of bleeding  Follow up plans for transitioning to oral anticoagulation  Thank you for allowing pharmacy to be a part of this patient's care.  Leron Croak, PharmD PGY1 Pharmacy Resident Phone: [phone]  Please check AMION for all [place] phone numbers 10/06/2018 11:32 AM

## 2018-10-06 NOTE — Progress Notes (Signed)
Capillary blood glucose 546, NP paged.

## 2018-10-06 NOTE — Progress Notes (Signed)
[place] for IV heparin Indication: DVT  Allergies  Allergen Reactions  . Penicillins Anaphylaxis and Hives    Has patient had a PCN reaction causing immediate rash, facial/tongue/throat swelling, SOB or lightheadedness with hypotension: yes Has patient had a PCN reaction causing severe rash involving mucus membranes or skin n24} If all of the above answers are "NO", then may proceed with Cephalosporin use.ecrosis: no Has patient had a PCN reaction that required hospitalization: no Has patient had a PCN reaction occurring within the last 10 years:no  . Tetracyclines & Related Anaphylaxis and Hives  . Morphine And Related Hives  . Amoxicillin     Same as penicillin  . Latex Itching and Rash  . Tape Itching and Rash    Patient Measurements: Height: 5\' 3"  (160 cm) Weight: 214 lb 4.8 oz (97.2 kg)(scale a) IBW/kg (Calculated) : 52.4 Heparin Dosing Weight: 75.5 kg  Vital Signs: Temp: 98 F (36.7 C) (12/28 0509) Temp Source: Oral (12/27 2208) BP: 134/55 (12/28 0509) Pulse Rate: 68 (12/28 0509)  Labs: Recent Labs    10/05/18 1910 10/05/18 2019 10/06/18 0042 10/06/18 0459  HGB 10.2*  --   --  9.0*  HCT 32.7*  --   --  27.6*  PLT 281  --   --  245  APTT  --  31  --   --   LABPROT  --  13.5  --   --   INR  --  1.04  --   --   HEPARINUNFRC  --   --   --  0.55  CREATININE 7.09*  --   --   --   TROPONINI <0.03  --  <0.03  --     Estimated Creatinine Clearance: 9.8 mL/min (A) (by C-G formula based on SCr of 7.09 mg/dL (H)).   Medical History: Past Medical History:  Diagnosis Date  . Arthritis   . Chronic anemia   . CKD (chronic kidney disease), stage V ([place])   . Diabetes mellitus ([place])   . History of stroke   . Hypertension   . Obesity   . Psychosis ([place])    a. prior adm in [place] ([place]) in 2015 where pt was admitted with hallucinations, seeing men in black outfits, family members on a non-existent camera,  hearing voices and seeing animals - UDS positive for methamphetamines at that time and patient stated she believed someone had put this in her body.  . Seizures ([place])     Medications:  Scheduled:  . amLODipine  10 mg Oral Daily  . atorvastatin  40 mg Oral q1800  . benztropine  2 mg Oral BID  . Ferrous Fumarate  1 tablet Oral Daily  . gabapentin  100 mg Oral BID  . Influenza vac split quadrivalent PF  0.5 mL Intramuscular Tomorrow-1000  . insulin aspart  0-9 Units Subcutaneous TID WC  . insulin glargine  20 Units Subcutaneous QHS  . levETIRAcetam  500 mg Oral BID  . metoprolol tartrate  50 mg Oral BID    Assessment: Pharmacy is consulted to dose heparin in 57 yo female diagnosed with DVT. Venous duplex exam showed positive finding of acute deep vein thrombosis involving right femoral vein mid to distal segments.   Posterior tibial and peroneal veins with very limited visibility when compress, study cannot completely exclude the possible existence of thrombosis at calf veins even though flow detected.  Initial heparin level 0.55 units/ml.  Drawn a little  early  Goal of Therapy:  Heparin level 0.3-0.7 units/ml Monitor platelets by anticoagulation protocol: Yes   Plan:   Continue heparin at 1300 units/hr  HL later today  Daily CBC, heparin level  Monitor for signs and symptoms of bleeding   Thanks for allowing pharmacy to be a part of this patient's care.  Excell Seltzer, PharmD Clinical Pharmacist  10/06/2018 6:02 AM

## 2018-10-06 NOTE — Progress Notes (Signed)
Patients blood glucose 450, NP paged.

## 2018-10-07 DIAGNOSIS — D649 Anemia, unspecified: Secondary | ICD-10-CM

## 2018-10-07 DIAGNOSIS — N185 Chronic kidney disease, stage 5: Secondary | ICD-10-CM | POA: Diagnosis not present

## 2018-10-07 DIAGNOSIS — I10 Essential (primary) hypertension: Secondary | ICD-10-CM | POA: Diagnosis not present

## 2018-10-07 DIAGNOSIS — I82411 Acute embolism and thrombosis of right femoral vein: Secondary | ICD-10-CM | POA: Diagnosis not present

## 2018-10-07 LAB — BASIC METABOLIC PANEL
Anion gap: 11 (ref 5–15)
BUN: 72 mg/dL — ABNORMAL HIGH (ref 6–20)
CO2: 18 mmol/L — ABNORMAL LOW (ref 22–32)
Calcium: 8.2 mg/dL — ABNORMAL LOW (ref 8.9–10.3)
Chloride: 107 mmol/L (ref 98–111)
Creatinine, Ser: 7.6 mg/dL — ABNORMAL HIGH (ref 0.44–1.00)
GFR calc Af Amer: 6 mL/min — ABNORMAL LOW (ref >60)
GFR calc non Af Amer: 5 mL/min — ABNORMAL LOW (ref >60)
Glucose, Bld: 146 mg/dL — ABNORMAL HIGH (ref 70–99)
Potassium: 3.7 mmol/L (ref 3.5–5.1)
Sodium: 136 mmol/L (ref 135–145)

## 2018-10-07 LAB — GLUCOSE, CAPILLARY
GLUCOSE-CAPILLARY: 158 mg/dL — AB (ref 70–99)
Glucose-Capillary: 201 mg/dL — ABNORMAL HIGH (ref 70–99)
Glucose-Capillary: 225 mg/dL — ABNORMAL HIGH (ref 70–99)
Glucose-Capillary: 290 mg/dL — ABNORMAL HIGH (ref 70–99)
Glucose-Capillary: 351 mg/dL — ABNORMAL HIGH (ref 70–99)

## 2018-10-07 MED ORDER — CHLORHEXIDINE GLUCONATE CLOTH 2 % EX PADS
6.0000 | MEDICATED_PAD | Freq: Every day | CUTANEOUS | Status: DC
  Administered 2018-10-09 – 2018-10-10 (×2): 6 via TOPICAL

## 2018-10-07 MED ORDER — FUROSEMIDE 40 MG PO TABS
40.0000 mg | ORAL_TABLET | Freq: Every day | ORAL | Status: DC
  Filled 2018-10-07: qty 1

## 2018-10-07 NOTE — Progress Notes (Signed)
[facility] PROGRESS NOTE  Amber Lara BSJ:[id number] DOB: 03/06/1961 DOA: 10/05/2018  PCP: Patient, No Pcp Per  Brief History/Interval Summary: 57 y.o. female with history of chronic kidney disease stage V has a right upper extremity AV fistula, essential hypertension, anemia, diabetes mellitus type 2, psychosis presented to the emergency department with complaints of chest pain and pain in the right lower extremity.  She was found to have a right lower extremity DVT.  She was hospitalized for further management.  She was also found to have worsening renal function.  She has been noncompliant with her medications and helps not taking them in a long time.  Reason for Visit: Acute DVT.  Acute on chronic kidney disease stage V  Consultants: Nephrology  Procedures:   Lower extremity Doppler  Positive for acute DVT   Antibiotics: None  Subjective/Interval History: Patient denies any complaints this morning.  She urinated a lot after getting Lasix yesterday.  She denies any shortness of breath.  No chest pain.    ROS: Denies any nausea or vomiting  Objective:  Vital Signs  Vitals:   10/06/18 1706 10/06/18 1921 10/06/18 2159 10/07/18 0643  BP: (!) 144/64 114/83 (!) 140/57 137/63  Pulse: 60 66 70 (!) 58  Resp: 15 18  20   Temp: 98.3 F (36.8 C) 97.7 F (36.5 C)  98 F (36.7 C)  TempSrc: Oral Oral    SpO2: 100%   100%  Weight:    97.6 kg  Height:        Intake/Output Summary (Last 24 hours) at 10/07/2018 1023 Last data filed at 10/07/2018 0917 Gross per 24 hour  Intake 2062.35 ml  Output 2350 ml  Net -287.65 ml   Filed Weights   10/05/18 2005 10/06/18 0037 10/07/18 0643  Weight: 99.8 kg 97.2 kg 97.6 kg    General appearance: Awake alert.  In no distress Resp: Clear to auscultation bilaterally.  Normal effort Cardio: S1-S2 is normal regular.  No S3-S4.  No rubs murmurs or bruit GI: Abdomen is soft.  Nontender nondistended.  Bowel sounds are present  normal.  No masses organomegaly Extremities: Right lower extremity is larger than left.  Edema is noted. Neurologic: Alert and oriented x3.  No focal neurological deficits.  Lab Results:  Data Reviewed: I have personally reviewed following labs and imaging studies  CBC: Recent Labs  Lab 10/05/18 1910 10/06/18 0459  WBC 7.5 6.8  HGB 10.2* 9.0*  HCT 32.7* 27.6*  MCV 83.6 80.2  PLT 281 765    Basic Metabolic Panel: Recent Labs  Lab 10/05/18 1910 10/06/18 0459 10/07/18 0540  NA 134* 134* 136  K 4.3 3.8 3.7  CL 106 106 107  CO2 18* 16* 18*  GLUCOSE 309* 265* 146*  BUN 70* 62* 72*  CREATININE 7.09* 6.66* 7.60*  CALCIUM 9.2 8.6* 8.2*  PHOS  --  4.9*  --     GFR: Estimated Creatinine Clearance: 9.2 mL/min (A) (by C-G formula based on SCr of 7.6 mg/dL (H)).  Liver Function Tests: Recent Labs  Lab 10/06/18 0459  AST 10*  ALT 9  ALKPHOS 99  BILITOT 0.3  PROT 5.4*  ALBUMIN 2.2*    Coagulation Profile: Recent Labs  Lab 10/05/18 2019  INR 1.04    Cardiac Enzymes: Recent Labs  Lab 10/05/18 1910 10/06/18 0042 10/06/18 0459 10/06/18 1145  TROPONINI <0.03 <0.03 <0.03 <0.03    CBG: Recent Labs  Lab 10/06/18 1702 10/06/18 2108 10/06/18 2251 10/07/18 0012 10/07/18 4650  GLUCAP 249* 450* 546* 351* 158*      Radiology Studies: Dg Chest 2 View  Result Date: 10/05/2018 CLINICAL DATA:  Chest pain EXAM: CHEST - 2 VIEW COMPARISON:  08/14/2018 FINDINGS: The heart size and mediastinal contours are within normal limits. Both lungs are clear. The visualized skeletal structures are unremarkable. IMPRESSION: No active cardiopulmonary disease. Electronically Signed   By: Ashley Royalty M.D.   On: 10/05/2018 18:24   Nm Pulmonary Vent And Perf (v/q Scan)  Result Date: 10/06/2018 CLINICAL DATA:  57 year old admitted yesterday for chest pain and RIGHT LOWER extremity pain and swelling. Chronic renal insufficiency precluded IV contrast for CTA chest. EXAM: NUCLEAR  MEDICINE VENTILATION - PERFUSION LUNG SCAN TECHNIQUE: Ventilation images were obtained in multiple projections using inhaled aerosol Tc-67m DTPA. Perfusion images were obtained in multiple projections after intravenous injection of Tc-6m MAA. RADIOPHARMACEUTICALS:  31.1 mCi of Tc-53m DTPA aerosol inhalation and 4.2 mCi Tc29m MAA IV COMPARISON:  No prior nuclear imaging. Chest x-ray yesterday is correlated. FINDINGS: Ventilation: No focal ventilation defect. Aerosol deposition in the trachea is noted. Perfusion: Normal pulmonary perfusion. No perfusion defects in either lung. IMPRESSION: Normal examination. Electronically Signed   By: Evangeline Dakin M.D.   On: 10/06/2018 10:13   Ct Renal Stone Study  Result Date: 10/06/2018 CLINICAL DATA:  Acute onset of flank pain. EXAM: CT ABDOMEN AND PELVIS WITHOUT CONTRAST TECHNIQUE: Multidetector CT imaging of the abdomen and pelvis was performed following the standard protocol without IV contrast. COMPARISON:  CT of the abdomen and pelvis from 07/15/2018 FINDINGS: Lower chest: Mild bibasilar atelectasis is noted. The visualized portions of the mediastinum are unremarkable. Hepatobiliary: The liver is unremarkable in appearance. The gallbladder is unremarkable in appearance. The common bile duct remains normal in caliber. Pancreas: The pancreas is within normal limits. Spleen: The spleen is unremarkable in appearance. Adrenals/Urinary Tract: The adrenal glands are unremarkable in appearance. No significant hydronephrosis is seen. There is suggestion of mild wall thickening at the left renal pelvis, which could reflect mild ureteritis. Mild nonspecific perinephric stranding is noted bilaterally. No renal or ureteral stones are identified. Stomach/Bowel: The stomach is unremarkable in appearance. The small bowel is within normal limits. The appendix is normal in caliber, without evidence of appendicitis. The colon is unremarkable in appearance. Vascular/Lymphatic: The  abdominal aorta is unremarkable in appearance. The inferior vena cava is grossly unremarkable. No retroperitoneal lymphadenopathy is seen. No pelvic sidewall lymphadenopathy is identified. Reproductive: The bladder is mildly distended and grossly unremarkable. The patient is status post hysterectomy. No suspicious adnexal masses are seen. Other: No additional soft tissue abnormalities are seen. Musculoskeletal: No acute osseous abnormalities are identified. The visualized musculature is unremarkable in appearance. IMPRESSION: 1. Suggestion of mild wall thickening at the left renal pelvis, which could reflect mild ureteritis. No evidence of hydronephrosis. 2. Mild bibasilar atelectasis noted. Electronically Signed   By: Garald Balding M.D.   On: 10/06/2018 03:46   Vas [place] Lower Extremity Venous (dvt) (mc And Wl 7a-7p)  Result Date: 10/07/2018  Lower Venous Study Indications: Pain, Swelling, and Edema.  Limitations: Body habitus and severe pain. Comparison Study: No prior study available Performing Technologist: Lorina Rabon  Examination Guidelines: A complete evaluation includes B-mode imaging, spectral Doppler, color Doppler, and power Doppler as needed of all accessible portions of each vessel. Bilateral testing is considered an integral part of a complete examination. Limited examinations for reoccurring indications may be performed as noted.  Right Venous Findings: +---------+---------------+---------+-----------+---------------+--------------+  CompressibilityPhasicitySpontaneityProperties     Summary        +---------+---------------+---------+-----------+---------------+--------------+ CFV      Full           Yes      Yes                                      +---------+---------------+---------+-----------+---------------+--------------+ SFJ      Full                                                              +---------+---------------+---------+-----------+---------------+--------------+ FV Prox  Full                                                             +---------+---------------+---------+-----------+---------------+--------------+ FV Mid   Partial                 Yes        partially      Acute                                                      re-cannalized                 +---------+---------------+---------+-----------+---------------+--------------+ FV DistalPartial                 Yes        partially      Acute                                                      re-cannalized                 +---------+---------------+---------+-----------+---------------+--------------+ PFV      Full                                                             +---------+---------------+---------+-----------+---------------+--------------+ POP      Full           Yes      Yes                                      +---------+---------------+---------+-----------+---------------+--------------+ PTV                              Yes  poor                                                                      visibility                                                                with                                                                      compression    +---------+---------------+---------+-----------+---------------+--------------+ PERO                                                       not well                                                                  visualized     +---------+---------------+---------+-----------+---------------+--------------+  Left Venous Findings: +---+---------------+---------+-----------+----------+-------+    CompressibilityPhasicitySpontaneityPropertiesSummary +---+---------------+---------+-----------+----------+-------+ CFVFull           Yes      Yes                           +---+---------------+---------+-----------+----------+-------+    Summary: Right: Findings consistent with acute deep vein thrombosis involving the right femoral vein. No cystic structure found in the popliteal fossa. Posterior tibial and peroneal veins with very limited visibility when compress, study cannot completely exclude  the possible existence of thrombosis at calf veins even though flow detected. Left: No evidence of common femoral vein obstruction.  *See table(s) above for measurements and observations. Electronically signed by Curt Jews MD on 10/07/2018 at 9:06:12 AM.    Final      Medications:  Scheduled: . amLODipine  10 mg Oral Daily  . apixaban  10 mg Oral BID   Followed by  . [START ON 10/13/2018] apixaban  5 mg Oral BID  . atorvastatin  40 mg Oral q1800  . benztropine  2 mg Oral BID  . Ferrous Fumarate  1 tablet Oral Daily  . gabapentin  100 mg Oral BID  . insulin aspart  0-9 Units Subcutaneous TID WC  . insulin glargine  20 Units Subcutaneous QHS  . levETIRAcetam  500 mg Oral BID  . metoprolol tartrate  50 mg Oral BID  . sodium bicarbonate  650 mg Oral TID   Continuous:  TMH:[id number] **OR** acetaminophen, guaiFENesin-dextromethorphan,  ondansetron **OR** ondansetron (ZOFRAN) IV    Assessment/Plan:  Acute lower extremity DVT, right No obvious risk factors identified.  Patient denies any road trip or travel.  Perhaps she has been sedentary.  Patient initially started on IV heparin.  Patient was refusing blood draws.  Discussed with pharmacy yesterday.  She was transitioned to apixaban.  This will probably be the safest medication to use in this patient who has been known to be noncompliant.     Acute on Chronic kidney disease stage V with normal anion gap metabolic acidosis Creatinine noted to be worse compared to previous values.  She was between 4 and 5 few months ago.  7 at the time of admission.  Creatinine did improve some yesterday.   Patient was given Lasix with good urine output but worsening in her BUN and creatinine this morning.  She does not have pulmonary edema.  Potassium is normal.  Metabolic acidosis is stable.  She does have a fistula in her right arm with good bruit.  Discussed with nephrology for consult as the patient may need to undergo dialysis soon.    Chest pain and shortness of breath Chest pain has resolved.  No PE noted on VQ scan.  No obvious ischemic changes on EKG.  Troponins were normal.  No further work-up at this time.    Left flank pain CT renal study was nonspecific.  There was a suggestion of ureteritis.  Patient denies any urinary symptoms except that she has been urinating less than usual.  We will hold off on antibiotics for now.  Essential hypertension Blood pressure is reasonably well controlled.  Continue with the amlodipine and metoprolol.  Hydralazine as needed.    Diabetes mellitus type 2, uncontrolled HbA1c was 11.8 in August.  Apparently patient has not been taking her Lantus for a week since she ran out of this medication.  Continue current dose of Lantus for now.  High glucose levels overnight were due to the sugary drinks that she had last evening.  SSI.  Anemia of chronic kidney disease No evidence for overt bleeding.  Monitor hemoglobin.  History of seizures Continue Keppra  History of psychosis Cogentin was resumed.  Has been noted to have some paranoia recently.  Delusional thoughts.  These were likely due to the fact that she was not taking her medications. Symptoms appear to have stabilized.  Hyperlipidemia Statins.   DVT Prophylaxis: Apixaban Code Status: Full code Family Communication: Discussed with the patient Disposition Plan: Management as outlined above.  Nephrology consulted.  PT and OT evaluation.  Apparently she does not have a place to go to.  Social worker consulted as well.    LOS: 0 days   [facility] Pager  [phone] 10/07/2018, 10:23 AM  If 7PM-7AM, please contact night-coverage at [email or web address], password [email or web address]

## 2018-10-07 NOTE — Plan of Care (Signed)
  Problem: Education: Goal: Ability to demonstrate management of disease process will improve Outcome: Progressing   Problem: Education: Goal: Ability to verbalize understanding of medication therapies will improve Outcome: Progressing   Problem: Activity: Goal: Capacity to carry out activities will improve Outcome: Progressing   Problem: Education: Goal: Knowledge of General Education information will improve Description Including pain rating scale, medication(s)/side effects and non-pharmacologic comfort measures Outcome: Progressing   Problem: Health Behavior/Discharge Planning: Goal: Ability to manage health-related needs will improve Outcome: Progressing

## 2018-10-07 NOTE — Plan of Care (Signed)
  Problem: Education: Goal: Ability to demonstrate management of disease process will improve Note:  Patient is gaurded on care with poor judgement

## 2018-10-07 NOTE — Progress Notes (Signed)
Patient has been very restless demanding more food gave teaching on Renal and carb modified diet. Refused bed alarm, got up walked to the nursing station, uneventful.

## 2018-10-07 NOTE — Consult Note (Signed)
[facility] Renal Consultation Note    Indication for Consultation:  Management of ESRD/hemodialysis; anemia, hypertension/volume and secondary hyperparathyroidism PCP:  HPI: Amber Lara is a 57 y.o. female with CKD stage 5 mostly likely D/T long standing DM/HTN. PMH significant for DMT2 duration approximately 25 years, HTN 4-5 years, H/O CVA, psychosis, seizures. She is homeless. She moved here in August 2019 from [place], [place] to be close to family. She is from [place]. Mother was on hemodialysis for 7 years prior to death, has sister who is currently on hemodialysis.  Patient was admitted with AoC CKD 08/24-08/30/19. R brachiocephalic AVF placed at that time per Dr. Trula Slade 06/07/2018. SCr 4.32 EGFR 11 on discharge. She was supposed for follow up with Dr. Posey Pronto at [facility] but did not keep appointment. She presented to ED with N & V 07/15/2018 SCr 4.22 EGFR 13. ED visit 08/14/18 for chest pain pain-SCr 5.04 EGFR 10. ED visit 09/21/18 Paranoia-SCr 6.3 EGFR 8.   Patient presented to ED 10/05/2018 with chest pain and RLE swelling and pain. She states that she has been out of medications for months, was hypertensive on arrival, BS 309 Co2 18 K+ 4.3 SCr 7.09 BUN 70 EGFR 7. Troponins 0.03 X 3. No evidence of PE on VQ scan. She was found to have acute DVT R femoral vein. She was initially started on heparin gtt per primary but refused labs draws for heparin levels. She has been transitioned to Apixaban.   She denies overt symptoms of uremia but does have dysquesia, lethargy. No N,V, no itching/pruritis, no asterixis, denies hematuria,dysuria, does have nocturia but can't quantitate number of episodes nightly. Says she "rebukes" dialysis-says she will be healed. Discussed previous labs values with her which demonstrate progression to ESRD. She has RUA AVF with strong bruit, appears ready for use.   Past Medical History:  Diagnosis Date  . Arthritis   . Chronic anemia   .  CKD (chronic kidney disease), stage V ([place])   . Diabetes mellitus ([place])   . History of stroke   . Hypertension   . Obesity   . Psychosis ([place])    a. prior adm in [place] ([place] in 2015 where pt was admitted with hallucinations, seeing men in black outfits, family members on a non-existent camera, hearing voices and seeing animals - UDS positive for methamphetamines at that time and patient stated she believed someone had put this in her body.  . Seizures ([place])    Past Surgical History:  Procedure Laterality Date  . ABDOMINAL HYSTERECTOMY    . AV FISTULA PLACEMENT Right 06/07/2018   Procedure: CREATION OF RIGHT BRACHIOCEPHALIC ARTERIOVENOUS FISTULA;  Surgeon: Serafina Mitchell, MD;  Location: [facility] OR;  Service: Vascular;  Laterality: Right;   Family History  Problem Relation Age of Onset  . Diabetes Mellitus II Mother   . CAD Mother        First MI in her 66s, passed away before age 70  . Renal Disease Mother   . Diabetes Mellitus II Sister   . CAD Sister        Stents in her early 20s  . Renal Disease Sister   . Diabetes Mellitus II Brother    Social History:  reports that she has been smoking. She has never used smokeless tobacco. She reports that she does not drink alcohol or use drugs. Allergies  Allergen Reactions  . Penicillins Anaphylaxis and Hives    Has patient had a PCN reaction causing  immediate rash, facial/tongue/throat swelling, SOB or lightheadedness with hypotension: yes Has patient had a PCN reaction causing severe rash involving mucus membranes or skin n24} If all of the above answers are "NO", then may proceed with Cephalosporin use.ecrosis: no Has patient had a PCN reaction that required hospitalization: no Has patient had a PCN reaction occurring within the last 10 years:no  . Tetracyclines & Related Anaphylaxis and Hives  . Morphine And Related Hives  . Amoxicillin     Same as penicillin  . Latex Itching and Rash  . Tape Itching and Rash    Prior to Admission medications   Medication Sig Start Date End Date Taking? Authorizing Provider  acetaminophen (TYLENOL) 325 MG tablet Take 2 tablets (650 mg total) by mouth every 6 (six) hours as needed for mild pain or moderate pain (or Fever >/= 101). 07/15/17  Yes Hongalgi, Lenis Dickinson, MD  amLODipine (NORVASC) 10 MG tablet Take 1 tablet (10 mg total) by mouth daily. 07/23/18  Yes Scot Jun, FNP  atorvastatin (LIPITOR) 40 MG tablet Take 1 tablet (40 mg total) by mouth daily at 6 PM. 07/19/18  Yes Scot Jun, FNP  benztropine (COGENTIN) 2 MG tablet Take 2 mg by mouth 2 (two) times daily. 03/24/17  Yes [provider]  Ferrous Fumarate (HEMOCYTE - 106 MG FE) 324 (106 Fe) MG TABS tablet Take 1 tablet (106 mg of iron total) by mouth daily. 07/25/18  Yes Scot Jun, FNP  gabapentin (NEURONTIN) 100 MG capsule Take 1 capsule (100 mg total) by mouth 2 (two) times daily. 07/19/18  Yes Scot Jun, FNP  insulin aspart (NOVOLOG) 100 UNIT/ML injection Inject 3 times daily based on sliding scale: 125=0 units 126-180=1 unit, 181-200=2 units 201-250 administer= 4 units 251-300 administer =6 units 301 or more 8 units-call provider 07/23/18  Yes Scot Jun, FNP  insulin glargine (LANTUS) 100 UNIT/ML injection Inject 0.5 mLs (50 Units total) into the skin at bedtime. 07/19/18 11/16/18 Yes Scot Jun, FNP  levETIRAcetam (KEPPRA) 500 MG tablet Take 1 tablet (500 mg total) by mouth 2 (two) times daily. 07/19/18 10/05/18 Yes Scot Jun, FNP  metoprolol tartrate (LOPRESSOR) 50 MG tablet Take 1 tablet (50 mg total) by mouth 2 (two) times daily. 07/19/18 10/05/18 Yes Scot Jun, FNP  Multiple Vitamin (MULTIVITAMIN WITH MINERALS) TABS tablet Take 1 tablet by mouth daily.   Yes [provider]  promethazine (PHENERGAN) 25 MG tablet Take 1 tablet (25 mg total) by mouth every 6 (six) hours as needed for nausea or vomiting. 07/19/18  Yes Scot Jun, FNP  blood glucose meter kit and supplies KIT Dispense based on patient and insurance preference. Use up to four times daily as directed. (FOR ICD-9 250.00, 250.01). 07/23/18   Scot Jun, FNP  furosemide (LASIX) 20 MG tablet Take 2 tablets (40 mg total) by mouth daily. Patient not taking: Reported on 10/05/2018 06/07/18 07/07/18  Elodia Florence., MD  Insulin Pen Needle (NOVOFINE) 30G X 8 MM MISC Inject insulin into skin per units prescribed. 07/19/17   Scot Jun, FNP  Insulin Syringe-Needle U-100 (TRUEPLUS INSULIN SYRINGE) 31G X 5/16" 0.3 ML MISC Use as directed 07/19/18   Scot Jun, FNP   Current Facility-Administered Medications  Medication Dose Route Frequency Provider Last Rate Last Dose  . acetaminophen (TYLENOL) tablet 650 mg  650 mg Oral Q6H PRN [name], MD   650 mg at 10/07/18 1033   Or  .  acetaminophen (TYLENOL) suppository 650 mg  650 mg Rectal Q6H PRN [name], MD      . amLODipine (NORVASC) tablet 10 mg  10 mg Oral Daily [name], MD   10 mg at 10/07/18 1034  . apixaban (ELIQUIS) tablet 10 mg  10 mg Oral BID Wynell Balloon, RPH   10 mg at 10/07/18 1035   Followed by  . [START ON 10/13/2018] apixaban (ELIQUIS) tablet 5 mg  5 mg Oral BID Wynell Balloon, RPH      . atorvastatin (LIPITOR) tablet 40 mg  40 mg Oral q1800 [name], MD   40 mg at 10/06/18 1707  . benztropine (COGENTIN) tablet 2 mg  2 mg Oral BID [name], MD   2 mg at 10/07/18 1033  . Ferrous Fumarate (HEMOCYTE - 106 mg FE) tablet 106 mg of iron  1 tablet Oral Daily [name], MD   106 mg of iron at 10/07/18 1033  . gabapentin (NEURONTIN) capsule 100 mg  100 mg Oral BID [name], MD   100 mg at 10/07/18 1035  . guaiFENesin-dextromethorphan (ROBITUSSIN DM) 100-10 MG/5ML syrup 5 mL  5 mL Oral Q4H PRN Bonnielee Haff, MD   5 mL at 10/07/18 1032  . insulin aspart (novoLOG) injection 0-9 Units  0-9  Units Subcutaneous TID WC [name], MD   5 Units at 10/07/18 1200  . insulin glargine (LANTUS) injection 20 Units  20 Units Subcutaneous QHS [name], MD   20 Units at 10/06/18 2157  . levETIRAcetam (KEPPRA) tablet 500 mg  500 mg Oral BID [name], MD   500 mg at 10/07/18 1044  . metoprolol tartrate (LOPRESSOR) tablet 50 mg  50 mg Oral BID [name], MD   50 mg at 10/07/18 1035  . ondansetron (ZOFRAN) tablet 4 mg  4 mg Oral Q6H PRN [name], MD       Or  . ondansetron [facility]) injection 4 mg  4 mg Intravenous Q6H PRN [name], MD      . sodium bicarbonate tablet 650 mg  650 mg Oral TID Bonnielee Haff, MD   650 mg at 10/07/18 1513   Labs: Basic Metabolic Panel: Recent Labs  Lab 10/05/18 1910 10/06/18 0459 10/07/18 0540  NA 134* 134* 136  K 4.3 3.8 3.7  CL 106 106 107  CO2 18* 16* 18*  GLUCOSE 309* 265* 146*  BUN 70* 62* 72*  CREATININE 7.09* 6.66* 7.60*  CALCIUM 9.2 8.6* 8.2*  PHOS  --  4.9*  --    Liver Function Tests: Recent Labs  Lab 10/06/18 0459  AST 10*  ALT 9  ALKPHOS 99  BILITOT 0.3  PROT 5.4*  ALBUMIN 2.2*   No results for input(s): LIPASE, AMYLASE in the last 168 hours. No results for input(s): AMMONIA in the last 168 hours. CBC: Recent Labs  Lab 10/05/18 1910 10/06/18 0459  WBC 7.5 6.8  HGB 10.2* 9.0*  HCT 32.7* 27.6*  MCV 83.6 80.2  PLT 281 245   Cardiac Enzymes: Recent Labs  Lab 10/05/18 1910 10/06/18 0042 10/06/18 0459 10/06/18 1145  TROPONINI <0.03 <0.03 <0.03 <0.03   CBG: Recent Labs  Lab 10/06/18 2251 10/07/18 0012 10/07/18 0738 10/07/18 1128 10/07/18 1616  GLUCAP 546* 351* 158* 290* 225*   Iron Studies: No results for input(s): IRON, TIBC, TRANSFERRIN, FERRITIN in the last 72 hours. Studies/Results: Dg Chest 2 View  Result Date:  10/05/2018 CLINICAL DATA:  Chest pain EXAM: CHEST - 2 VIEW COMPARISON:  08/14/2018 FINDINGS: The heart size and mediastinal  contours are within normal limits. Both lungs are clear. The visualized skeletal structures are unremarkable. IMPRESSION: No active cardiopulmonary disease. Electronically Signed   By: Ashley Royalty M.D.   On: 10/05/2018 18:24   Nm Pulmonary Vent And Perf (v/q Scan)  Result Date: 10/06/2018 CLINICAL DATA:  57 year old admitted yesterday for chest pain and RIGHT LOWER extremity pain and swelling. Chronic renal insufficiency precluded IV contrast for CTA chest. EXAM: NUCLEAR MEDICINE VENTILATION - PERFUSION LUNG SCAN TECHNIQUE: Ventilation images were obtained in multiple projections using inhaled aerosol Tc-42mDTPA. Perfusion images were obtained in multiple projections after intravenous injection of Tc-98mAA. RADIOPHARMACEUTICALS:  31.1 mCi of Tc-9954mPA aerosol inhalation and 4.2 mCi Tc99m84m IV COMPARISON:  No prior nuclear imaging. Chest x-ray yesterday is correlated. FINDINGS: Ventilation: No focal ventilation defect. Aerosol deposition in the trachea is noted. Perfusion: Normal pulmonary perfusion. No perfusion defects in either lung. IMPRESSION: Normal examination. Electronically Signed   By: ThomEvangeline Dakin.   On: 10/06/2018 10:13   Ct Renal Stone Study  Result Date: 10/06/2018 CLINICAL DATA:  Acute onset of flank pain. EXAM: CT ABDOMEN AND PELVIS WITHOUT CONTRAST TECHNIQUE: Multidetector CT imaging of the abdomen and pelvis was performed following the standard protocol without IV contrast. COMPARISON:  CT of the abdomen and pelvis from 07/15/2018 FINDINGS: Lower chest: Mild bibasilar atelectasis is noted. The visualized portions of the mediastinum are unremarkable. Hepatobiliary: The liver is unremarkable in appearance. The gallbladder is unremarkable in appearance. The common bile duct remains normal in caliber. Pancreas: The pancreas is within normal limits. Spleen: The spleen is unremarkable in appearance. Adrenals/Urinary Tract: The adrenal glands are unremarkable in appearance. No  significant hydronephrosis is seen. There is suggestion of mild wall thickening at the left renal pelvis, which could reflect mild ureteritis. Mild nonspecific perinephric stranding is noted bilaterally. No renal or ureteral stones are identified. Stomach/Bowel: The stomach is unremarkable in appearance. The small bowel is within normal limits. The appendix is normal in caliber, without evidence of appendicitis. The colon is unremarkable in appearance. Vascular/Lymphatic: The abdominal aorta is unremarkable in appearance. The inferior vena cava is grossly unremarkable. No retroperitoneal lymphadenopathy is seen. No pelvic sidewall lymphadenopathy is identified. Reproductive: The bladder is mildly distended and grossly unremarkable. The patient is status post hysterectomy. No suspicious adnexal masses are seen. Other: No additional soft tissue abnormalities are seen. Musculoskeletal: No acute osseous abnormalities are identified. The visualized musculature is unremarkable in appearance. IMPRESSION: 1. Suggestion of mild wall thickening at the left renal pelvis, which could reflect mild ureteritis. No evidence of hydronephrosis. 2. Mild bibasilar atelectasis noted. Electronically Signed   By: JeffGarald Balding.   On: 10/06/2018 03:46   Vas Us LK[place]er Extremity Venous (dvt) (mc And Wl 7a-7p)  Result Date: 10/07/2018  Lower Venous Study Indications: Pain, Swelling, and Edema.  Limitations: Body habitus and severe pain. Comparison Study: No prior study available Performing Technologist: COLELorina Rabonamination Guidelines: A complete evaluation includes B-mode imaging, spectral Doppler, color Doppler, and power Doppler as needed of all accessible portions of each vessel. Bilateral testing is considered an integral part of a complete examination. Limited examinations for reoccurring indications may be performed as noted.  Right Venous Findings:  +---------+---------------+---------+-----------+---------------+--------------+          CompressibilityPhasicitySpontaneityProperties     Summary        +---------+---------------+---------+-----------+---------------+--------------+ CFV  Full           Yes      Yes                                      +---------+---------------+---------+-----------+---------------+--------------+ SFJ      Full                                                             +---------+---------------+---------+-----------+---------------+--------------+ FV Prox  Full                                                             +---------+---------------+---------+-----------+---------------+--------------+ FV Mid   Partial                 Yes        partially      Acute                                                      re-cannalized                 +---------+---------------+---------+-----------+---------------+--------------+ FV DistalPartial                 Yes        partially      Acute                                                      re-cannalized                 +---------+---------------+---------+-----------+---------------+--------------+ PFV      Full                                                             +---------+---------------+---------+-----------+---------------+--------------+ POP      Full           Yes      Yes                                      +---------+---------------+---------+-----------+---------------+--------------+ PTV                              Yes                       poor  visibility                                                                with                                                                      compression    +---------+---------------+---------+-----------+---------------+--------------+ PERO                                                        not well                                                                  visualized     +---------+---------------+---------+-----------+---------------+--------------+  Left Venous Findings: +---+---------------+---------+-----------+----------+-------+    CompressibilityPhasicitySpontaneityPropertiesSummary +---+---------------+---------+-----------+----------+-------+ CFVFull           Yes      Yes                          +---+---------------+---------+-----------+----------+-------+    Summary: Right: Findings consistent with acute deep vein thrombosis involving the right femoral vein. No cystic structure found in the popliteal fossa. Posterior tibial and peroneal veins with very limited visibility when compress, study cannot completely exclude  the possible existence of thrombosis at calf veins even though flow detected. Left: No evidence of common femoral vein obstruction.  *See table(s) above for measurements and observations. Electronically signed by Curt Jews MD on 10/07/2018 at 9:06:12 AM.    Final     ROS: As per HPI otherwise negative.    Physical Exam: Vitals:   10/06/18 1921 10/06/18 2159 10/07/18 0643 10/07/18 1141  BP: 114/83 (!) 140/57 137/63 (!) 144/59  Pulse: 66 70 (!) 58 63  Resp: 18  20 16   Temp: 97.7 F (36.5 C)  98 F (36.7 C) 98.2 F (36.8 C)  TempSrc: Oral   Oral  SpO2:   100% 100%  Weight:   97.6 kg   Height:         General: Well developed, well nourished, in no acute distress. Head: Normocephalic, atraumatic, sclera non-icteric, mucus membranes are moist Neck: Supple. JVD not elevated. Lungs: Clear bilaterally to auscultation without wheezes, rales, or rhonchi. Breathing is unlabored. Heart: RRR with S1 S2. No murmurs, rubs, or gallops appreciated. Abdomen: Soft, non-tender, non-distended with normoactive bowel sounds. No rebound/guarding. No obvious abdominal masses. M-S:   Strength and tone appear normal for age. Lower extremities: Trace BLE edema Neuro: Alert and oriented X 3. Moves all extremities spontaneously. Psych:  Responds to questions appropriately with a normal affect. Dialysis Access: R AVF + bruit  Assessment/Plan: 1.  CKD stage 5 with mild symptoms of uremia. Patient not willing to start HD yet. Has functioning AVF. Missed follow up with [facility] in Fall. Concerned she will not keep OP appts. She is acidotic-add oral Sodium bicarbonate 650 mg PO TID. Will see if patient can be persuaded to start HD here and start CLIP process.  2. DVT R Femoral Vein-started on heparin-now transitioned to Apixaban. Per primary.  3.  Hypertension/volume  - Hypertensive on admission, better control since home BP meds resumed. Would add low dose furosemide to optimize volume status and further lower BP.  4.  Anemia  -HGB 10.8 check iron panel. Is on oral supplement when she has medications.  5.  Metabolic bone disease -Check renal function panel 6.  Nutrition -Renal/Carb mod diet. Albumin 2.2. Add prostat, renal vit 7.  DM-per primary.  8.  Psychosis-cont psych meds per primary. Stop gabapentin.  9.  Medical non-compliance 10.  Social issues-homeless, has not been able to obtain BP/DM medications-MSW has been consulted.   Rita H. Owens Shark, NP-C 10/07/2018, 4:19 PM  [employer] [phone]

## 2018-10-07 NOTE — Progress Notes (Signed)
Patient very non compliant, asks for multiple drinks and snacks. Explained that she eats out a lot. Educated patient on restriction and what we have to offer here.

## 2018-10-08 DIAGNOSIS — E1165 Type 2 diabetes mellitus with hyperglycemia: Secondary | ICD-10-CM | POA: Diagnosis not present

## 2018-10-08 DIAGNOSIS — N186 End stage renal disease: Secondary | ICD-10-CM | POA: Diagnosis not present

## 2018-10-08 DIAGNOSIS — I82411 Acute embolism and thrombosis of right femoral vein: Secondary | ICD-10-CM | POA: Diagnosis not present

## 2018-10-08 DIAGNOSIS — I10 Essential (primary) hypertension: Secondary | ICD-10-CM | POA: Diagnosis not present

## 2018-10-08 LAB — RENAL FUNCTION PANEL
Albumin: 2.1 g/dL — ABNORMAL LOW (ref 3.5–5.0)
Anion gap: 13 (ref 5–15)
BUN: 75 mg/dL — ABNORMAL HIGH (ref 6–20)
CO2: 15 mmol/L — ABNORMAL LOW (ref 22–32)
Calcium: 8.2 mg/dL — ABNORMAL LOW (ref 8.9–10.3)
Chloride: 102 mmol/L (ref 98–111)
Creatinine, Ser: 7.33 mg/dL — ABNORMAL HIGH (ref 0.44–1.00)
GFR calc Af Amer: 7 mL/min — ABNORMAL LOW (ref >60)
GFR calc non Af Amer: 6 mL/min — ABNORMAL LOW (ref >60)
Glucose, Bld: 304 mg/dL — ABNORMAL HIGH (ref 70–99)
Phosphorus: 4.7 mg/dL — ABNORMAL HIGH (ref 2.5–4.6)
Potassium: 3.9 mmol/L (ref 3.5–5.1)
Sodium: 130 mmol/L — ABNORMAL LOW (ref 135–145)

## 2018-10-08 LAB — CBC
HCT: 25.4 % — ABNORMAL LOW (ref 36.0–46.0)
Hemoglobin: 8.3 g/dL — ABNORMAL LOW (ref 12.0–15.0)
MCH: 26.3 pg (ref 26.0–34.0)
MCHC: 32.7 g/dL (ref 30.0–36.0)
MCV: 80.4 fL (ref 80.0–100.0)
Platelets: 239 10*3/uL (ref 150–400)
RBC: 3.16 MIL/uL — ABNORMAL LOW (ref 3.87–5.11)
RDW: 14.5 % (ref 11.5–15.5)
WBC: 6.7 10*3/uL (ref 4.0–10.5)
nRBC: 0 % (ref 0.0–0.2)

## 2018-10-08 LAB — GLUCOSE, CAPILLARY
GLUCOSE-CAPILLARY: 206 mg/dL — AB (ref 70–99)
Glucose-Capillary: 207 mg/dL — ABNORMAL HIGH (ref 70–99)
Glucose-Capillary: 301 mg/dL — ABNORMAL HIGH (ref 70–99)
Glucose-Capillary: 286 mg/dL — ABNORMAL HIGH (ref 70–99)

## 2018-10-08 LAB — IRON AND TIBC
Iron: 28 ug/dL (ref 28–170)
Saturation Ratios: 14 % (ref 10.4–31.8)
TIBC: 199 ug/dL — ABNORMAL LOW (ref 250–450)
UIBC: 171 ug/dL

## 2018-10-08 MED ORDER — HEPARIN (PORCINE) 25000 UT/250ML-% IV SOLN
1200.0000 [IU]/h | INTRAVENOUS | Status: DC
  Administered 2018-10-08 – 2018-10-09 (×2): 1050 [IU]/h via INTRAVENOUS
  Administered 2018-10-10: 1250 [IU]/h via INTRAVENOUS
  Filled 2018-10-08 (×2): qty 250

## 2018-10-08 MED ORDER — CHLORHEXIDINE GLUCONATE CLOTH 2 % EX PADS
6.0000 | MEDICATED_PAD | Freq: Every day | CUTANEOUS | Status: DC
  Administered 2018-10-09 – 2018-10-10 (×2): 6 via TOPICAL

## 2018-10-08 MED ORDER — INSULIN GLARGINE 100 UNIT/ML ~~LOC~~ SOLN
24.0000 [IU] | Freq: Every day | SUBCUTANEOUS | Status: DC
  Administered 2018-10-08: 24 [IU] via SUBCUTANEOUS
  Filled 2018-10-08: qty 0.24

## 2018-10-08 NOTE — Care Management Note (Addendum)
Case Management Note  Patient Details  Name: Catalina Salasar MRN: [id number] Date of Birth: April 09, 1961  Subjective/Objective:   Chest Pain                Action/Plan: Patient lives at home; goes to the [facility] for primary  Care; has private insurance with [facility] with prescription drug coverage; CM will continue to follow for progression of care. Benefit check in progress for Eliquis.  Expected Discharge Date: possibly 10/09/2018              Expected Discharge Plan:  Home/Self Care  Discharge planning Services  CM Consult  Status of Service:  In process, will continue to follow  Sherrilyn Rist [phone] 10/08/2018, 11:42 AM

## 2018-10-08 NOTE — Progress Notes (Addendum)
Patient stated that her sister is in treatment center in [place]. Would like to be close to her if possible.

## 2018-10-08 NOTE — Progress Notes (Addendum)
ANTICOAGULATION CONSULT NOTE - Initial Consult  Pharmacy Consult for heparin Indication: DVT  Allergies  Allergen Reactions  . Penicillins Anaphylaxis and Hives    Has patient had a PCN reaction causing immediate rash, facial/tongue/throat swelling, SOB or lightheadedness with hypotension: yes Has patient had a PCN reaction causing severe rash involving mucus membranes or skin n24} If all of the above answers are "NO", then may proceed with Cephalosporin use.ecrosis: no Has patient had a PCN reaction that required hospitalization: no Has patient had a PCN reaction occurring within the last 10 years:no  . Tetracyclines & Related Anaphylaxis and Hives  . Morphine And Related Hives  . Amoxicillin     Same as penicillin  . Latex Itching and Rash  . Tape Itching and Rash    Patient Measurements: Height: 5\' 3"  (160 cm) Weight: 221 lb 3.2 oz (100.3 kg)(scale a) IBW/kg (Calculated) : 52.4 Heparin Dosing Weight: 75 kg  Vital Signs: Temp: 97.5 F (36.4 C) (12/30 1107) Temp Source: Oral (12/30 1107) BP: 147/60 (12/30 1107) Pulse Rate: 62 (12/30 1107)  Labs: Recent Labs    10/05/18 1910 10/05/18 2019 10/06/18 0042 10/06/18 0459 10/06/18 0934 10/06/18 1145 10/07/18 0540 10/08/18 0443  HGB 10.2*  --   --  9.0*  --   --   --  8.3*  HCT 32.7*  --   --  27.6*  --   --   --  25.4*  PLT 281  --   --  245  --   --   --  239  APTT  --  31  --   --   --   --   --   --   LABPROT  --  13.5  --   --   --   --   --   --   INR  --  1.04  --   --   --   --   --   --   HEPARINUNFRC  --   --   --  0.55 0.67  --   --   --   CREATININE 7.09*  --   --  6.66*  --   --  7.60* 7.33*  TROPONINI <0.03  --  <0.03 <0.03  --  <0.03  --   --     Estimated Creatinine Clearance: 9.7 mL/min (A) (by C-G formula based on SCr of 7.33 mg/dL (H)).   Medical History: Past Medical History:  Diagnosis Date  . Arthritis   . Chronic anemia   . CKD (chronic kidney disease), stage V ([place])   . Diabetes  mellitus ([place])   . History of stroke   . Hypertension   . Obesity   . Psychosis ([place])    a. prior adm in [place] ([place]) in 2015 where pt was admitted with hallucinations, seeing men in black outfits, family members on a non-existent camera, hearing voices and seeing animals - UDS positive for methamphetamines at that time and patient stated she believed someone had put this in her body.  . Seizures ([place])     Medications:  Scheduled:  . amLODipine  10 mg Oral Daily  . atorvastatin  40 mg Oral q1800  . benztropine  2 mg Oral BID  . Chlorhexidine Gluconate Cloth  6 each Topical Q0600  . Ferrous Fumarate  1 tablet Oral Daily  . insulin aspart  0-9 Units Subcutaneous TID WC  . insulin glargine  24 Units Subcutaneous QHS  . levETIRAcetam  500 mg Oral BID  . metoprolol tartrate  50 mg Oral BID    Assessment: 2 yof presented with CP and pain in R LE, found to have R LE DVT. Started on apixaban treatment dose - last dose on 12/30@1003 .  Plan to undergoing fistulogram and possible thrombectomy by IR - will change to heparin infusion.   Hgb 8.3, plt 239. No s/sx of bleeding. Given recent apixaban dosing, will order both aPTT and heparin level to monitor until correlate.  Goal of Therapy:  Heparin level 0.3-0.7 units/ml Monitor platelets by anticoagulation protocol: Yes   Plan:  Start heparin infusion at 1050 units/hr on 12/30 at 2200 Check anti-Xa level in 8 hours and daily while on heparin Continue to monitor H&H and platelets  Antonietta Jewel, PharmD, [place] Clinical Pharmacist  Pager: [phone] Phone: [phone] 10/08/2018,5:17 PM

## 2018-10-08 NOTE — Evaluation (Signed)
Physical Therapy Evaluation Patient Details Name: Amber Lara MRN: [id number] DOB: 22-Sep-1961 Today's Date: 10/08/2018   History of Present Illness  Patient is a 56 y/o female who presents with hyperglycemia, leg pain and chest pain. Found to have acute DVT RLE. 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  Clinical Impression  Patient presents with cognitive deficits (hx of psychosis), generalized weakness, pain, impaired balance and impaired mobility s/p above. Pt was living with a roommate PTA but reports she is not going back there. Has difficulty caring for self at baseline and roommate was doing all IADLs. Using [facility] PTA. Tolerated gait training with min A-min guard for balance/safety due to shuffling like gait and forward momentum with pt tripping over feet. Would benefit from post acute rehab to maximize independence and mobility as well as assist with housing prior to d/c. Will follow acutely.    Follow Up Recommendations SNF    Equipment Recommendations  None recommended by PT    Recommendations for Other Services       Precautions / Restrictions Precautions Precautions: Fall Restrictions Weight Bearing Restrictions: No      Mobility  Bed Mobility Overal bed mobility: Modified Independent             General bed mobility comments: Use of rail for support.   Transfers Overall transfer level: Needs assistance Equipment used: None Transfers: Sit to/from Stand Sit to Stand: Min guard         General transfer comment: close Min guard for safety as pt reaching for counter for support in standing.   Ambulation/Gait Ambulation/Gait assistance: Min guard;Min assist Gait Distance (Feet): 75 Feet Assistive device: None(rail at times) Gait Pattern/deviations: Step-to pattern;Shuffle;Trunk flexed     General Gait Details: Shuffling like gait with CoM too far anterior over BoS causing pt to trip over feet at times and  resulting in increased momentum forward; 1 standing rest break. Min A at times for balance.   Stairs            Wheelchair Mobility    Modified Rankin (Stroke Patients Only)       Balance Overall balance assessment: Needs assistance Sitting-balance support: Feet supported;No upper extremity supported Sitting balance-Leahy Scale: Good     Standing balance support: During functional activity Standing balance-Leahy Scale: Fair                               Pertinent Vitals/Pain Pain Assessment: 0-10 Pain Score: 10-Worst pain ever Pain Location: BLEs; back Pain Descriptors / Indicators: Radiating;Aching;Sore Pain Intervention(s): Monitored during session;Repositioned;Limited activity within patient's tolerance    Home Living Family/patient expects to be discharged to:: [place]: (roommate) Available Help at Discharge: Friend(s);Available PRN/intermittently Type of Home: House         Home Equipment: Kasandra Knudsen - single point      Prior Function Level of Independence: Needs assistance   Gait / Transfers Assistance Needed: uses SPC for ambulation.   ADL's / Homemaking Assistance Needed: Does own Self care. Roommate assists with IADLs. Reports no falls- unsure of accuracy        Hand Dominance        Extremity/Trunk Assessment   Upper Extremity Assessment Upper Extremity Assessment: Defer to OT evaluation    Lower Extremity Assessment Lower Extremity Assessment: Generalized weakness RLE Sensation: history of peripheral neuropathy LLE Sensation: history of peripheral neuropathy  Cervical / Trunk Assessment Cervical / Trunk Assessment: Normal  Communication   Communication: No difficulties  Cognition Arousal/Alertness: Awake/alert Behavior During Therapy: WFL for tasks assessed/performed Overall Cognitive Status: No family/caregiver present to determine baseline cognitive functioning Area of Impairment:  Attention;Safety/judgement                   Current Attention Level: Selective     Safety/Judgement: Decreased awareness of deficits     General Comments: Goes on tangents at times; stories are not consistent. Not sure she understands her health issues very well. Hx of psychosis      General Comments      Exercises     Assessment/Plan    PT Assessment Patient needs continued PT services  PT Problem List Decreased strength;Decreased mobility;Decreased safety awareness;Decreased balance;Impaired sensation;Decreased activity tolerance;Decreased cognition       PT Treatment Interventions Balance training;Patient/family education;Gait training;Stair training;Therapeutic exercise;Therapeutic activities;Functional mobility training;DME instruction    PT Goals (Current goals can be found in the Care Plan section)  Acute Rehab PT Goals Patient Stated Goal: to find somewhere to live by myself PT Goal Formulation: With patient Time For Goal Achievement: 10/22/18 Potential to Achieve Goals: Good    Frequency Min 3X/week   Barriers to discharge Decreased caregiver support      Co-evaluation               AM-PAC PT "6 Clicks" Mobility  Outcome Measure Help needed turning from your back to your side while in a flat bed without using bedrails?: A Little Help needed moving from lying on your back to sitting on the side of a flat bed without using bedrails?: A Little Help needed moving to and from a bed to a chair (including a wheelchair)?: A Little Help needed standing up from a chair using your arms (e.g., wheelchair or bedside chair)?: None Help needed to walk in hospital room?: A Little Help needed climbing 3-5 steps with a railing? : A Little 6 Click Score: 19    End of Session Equipment Utilized During Treatment: Gait belt Activity Tolerance: Patient limited by fatigue Patient left: in bed;with call bell/phone within reach;with bed alarm set Nurse  Communication: Mobility status PT Visit Diagnosis: Unsteadiness on feet (R26.81);Difficulty in walking, not elsewhere classified (R26.2)    Time: 2878-6767 PT Time Calculation (min) (ACUTE ONLY): 18 min   Charges:   PT Evaluation $PT Eval Low Complexity: 1 Low          Wray Kearns, PT, DPT Acute Rehabilitation Services Pager [phone] Office [phone]      Marguarite Arbour A Sabra Heck 10/08/2018, 12:43 PM
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
## 2018-10-08 NOTE — Progress Notes (Addendum)
[facility] PROGRESS NOTE  Amber Lara OHY:[id number] DOB: [birth year] DOA: 10/05/2018  PCP: Patient, No Pcp Per  Brief History/Interval Summary: 57 y.o. female with history of chronic kidney disease stage V has a right upper extremity AV fistula, essential hypertension, anemia, diabetes mellitus type 2, psychosis presented to the emergency department with complaints of chest pain and pain in the right lower extremity.  She was found to have a right lower extremity DVT.  She was hospitalized for further management.  She was also found to have worsening renal function.  She has been noncompliant with her medications and helps not taking them in a long time.  Reason for Visit: Acute DVT.  Acute on chronic kidney disease stage V  Consultants: Nephrology  Procedures:   Lower extremity Doppler  Positive for acute DVT   Antibiotics: None  Subjective/Interval History: Patient somnolent this morning but easily arousable.  No complaints offered.     ROS: Denies any nausea or vomiting  Objective:  Vital Signs  Vitals:   10/07/18 2018 10/07/18 2216 10/08/18 0210 10/08/18 0437  BP: (!) 93/56 (!) 153/55  (!) 131/56  Pulse: 65 63  61  Resp: 18   20  Temp: 98 F (36.7 C)   97.6 F (36.4 C)  TempSrc: Oral   Oral  SpO2: 100%   96%  Weight:   100.3 kg   Height:        Intake/Output Summary (Last 24 hours) at 10/08/2018 1001 Last data filed at 10/08/2018 0218 Gross per 24 hour  Intake 1256 ml  Output 900 ml  Net 356 ml   Filed Weights   10/06/18 0037 10/07/18 0643 10/08/18 0210  Weight: 97.2 kg 97.6 kg 100.3 kg   General appearance: Somnolent this morning.  In no distress Resp: Clear to auscultation bilaterally.  Normal effort Cardio: S1-S2 is normal regular.  No S3-S4.  No rubs murmurs or bruit GI: Abdomen is soft.  Nontender nondistended.  Bowel sounds are present normal.  No masses organomegaly Extremities: Right leg is larger than left.  No changes compared to  yesterday. Neurologic: No focal neurological deficits   Lab Results:  Data Reviewed: I have personally reviewed following labs and imaging studies  CBC: Recent Labs  Lab 10/05/18 1910 10/06/18 0459 10/08/18 0443  WBC 7.5 6.8 6.7  HGB 10.2* 9.0* 8.3*  HCT 32.7* 27.6* 25.4*  MCV 83.6 80.2 80.4  PLT 281 245 948    Basic Metabolic Panel: Recent Labs  Lab 10/05/18 1910 10/06/18 0459 10/07/18 0540 10/08/18 0443  NA 134* 134* 136 130*  K 4.3 3.8 3.7 3.9  CL 106 106 107 102  CO2 18* 16* 18* 15*  GLUCOSE 309* 265* 146* 304*  BUN 70* 62* 72* 75*  CREATININE 7.09* 6.66* 7.60* 7.33*  CALCIUM 9.2 8.6* 8.2* 8.2*  PHOS  --  4.9*  --  4.7*    GFR: Estimated Creatinine Clearance: 9.7 mL/min (A) (by C-G formula based on SCr of 7.33 mg/dL (H)).  Liver Function Tests: Recent Labs  Lab 10/06/18 0459 10/08/18 0443  AST 10*  --   ALT 9  --   ALKPHOS 99  --   BILITOT 0.3  --   PROT 5.4*  --   ALBUMIN 2.2* 2.1*    Coagulation Profile: Recent Labs  Lab 10/05/18 2019  INR 1.04    Cardiac Enzymes: Recent Labs  Lab 10/05/18 1910 10/06/18 0042 10/06/18 0459 10/06/18 1145  TROPONINI <0.03 <0.03 <0.03 <0.03  CBG: Recent Labs  Lab 10/07/18 0738 10/07/18 1128 10/07/18 1616 10/07/18 2116 10/08/18 0720  GLUCAP 158* 290* 225* 201* 206*      Radiology Studies: Nm Pulmonary Vent And Perf (v/q Scan)  Result Date: 10/06/2018 CLINICAL DATA:  57 year old admitted yesterday for chest pain and RIGHT LOWER extremity pain and swelling. Chronic renal insufficiency precluded IV contrast for CTA chest. EXAM: NUCLEAR MEDICINE VENTILATION - PERFUSION LUNG SCAN TECHNIQUE: Ventilation images were obtained in multiple projections using inhaled aerosol Tc-83m DTPA. Perfusion images were obtained in multiple projections after intravenous injection of Tc-76m MAA. RADIOPHARMACEUTICALS:  31.1 mCi of Tc-65m DTPA aerosol inhalation and 4.2 mCi Tc68m MAA IV COMPARISON:  No prior nuclear  imaging. Chest x-ray yesterday is correlated. FINDINGS: Ventilation: No focal ventilation defect. Aerosol deposition in the trachea is noted. Perfusion: Normal pulmonary perfusion. No perfusion defects in either lung. IMPRESSION: Normal examination. Electronically Signed   By: Evangeline Dakin M.D.   On: 10/06/2018 10:13     Medications:  Scheduled: . amLODipine  10 mg Oral Daily  . apixaban  10 mg Oral BID   Followed by  . [START ON 10/13/2018] apixaban  5 mg Oral BID  . atorvastatin  40 mg Oral q1800  . benztropine  2 mg Oral BID  . Chlorhexidine Gluconate Cloth  6 each Topical Q0600  . Ferrous Fumarate  1 tablet Oral Daily  . insulin aspart  0-9 Units Subcutaneous TID WC  . insulin glargine  20 Units Subcutaneous QHS  . levETIRAcetam  500 mg Oral BID  . metoprolol tartrate  50 mg Oral BID   Continuous:  WSF:[id number] **OR** acetaminophen, guaiFENesin-dextromethorphan, ondansetron **OR** ondansetron (ZOFRAN) IV    Assessment/Plan:  Acute lower extremity DVT, right No obvious risk factors identified.  Patient denies any road trip or travel.  Perhaps she has been sedentary.  Patient initially started on IV heparin.  Patient was transitioned to apixaban after discussing with pharmacy. This will probably be the safest anticoagulation to use in this patient who has been known to be noncompliant.     Acute on Chronic kidney disease stage V with normal anion gap metabolic acidosis Creatinine noted to be worse compared to previous values.  She was between 4 and 5 few months ago.  7 at the time of admission.  Patient was given Lasix.  Initially some improvement was noted but creatinine remains greater than 7.  Nephrology was consulted.  Initially patient was not keen on undergoing dialysis but after further discussion she is agreeable.  Plan is to be dialyzed today.  She has a fistula in her right arm.     Chest pain and shortness of breath Chest pain has resolved.  No PE noted on VQ  scan.  No obvious ischemic changes on EKG.  Troponins were normal.  No further work-up at this time.    Left flank pain CT renal study was nonspecific.  There was a suggestion of ureteritis.  However patient was not truly symptomatic.  No role for antibiotics currently.  Essential hypertension Blood pressure is reasonably well controlled.  Continue with the amlodipine and metoprolol.  Hydralazine as needed.  Monitor while she is getting dialyzed.  May have to cut back on some of these medications.  Diabetes mellitus type 2, uncontrolled HbA1c was 11.8 in August.  Apparently patient has not been taking her Lantus for a week since she ran out of this medication.  CBGs better controlled but still greater than 200.  Patient apparently not  compliant with her diet.  Will go up slightly on the dose of Lantus.  SSI.  Anemia of chronic kidney disease No evidence for overt bleeding.  Monitor hemoglobin.  History of seizures Continue Keppra  History of psychosis Patient had some paranoia and delusional thoughts.  She had not taken her Cogentin in many days.  This was resumed.  Mentation is stable now.    Hyperlipidemia Statins.  ADDENDUM Called by Dr. Royce Macadamia with nephrology that the patient's fistula was accessed in dialysis but has multiple clots.  She will need a fistulogram and possible thrombectomy.  For this anticoagulation will have to be held.  So we will stop her apixaban and switch her over to IV heparin.  DVT Prophylaxis: Apixaban Code Status: Full code Family Communication: Discussed with the patient Disposition Plan: Management as outlined above.  Nephrology is following.  Plan is for dialysis today.  PT and OT evaluation.  Social worker to see as apparently patient does not have any place to go to.      LOS: 0 days   [facility] Pager [phone] 10/08/2018, 10:01 AM  If 7PM-7AM, please contact night-coverage at [email or web address], password [email or web address]

## 2018-10-08 NOTE — Clinical Social Work Placement (Signed)
   CLINICAL SOCIAL WORK PLACEMENT  NOTE  Date:  10/08/2018  Patient Details  Name: Amber Lara MRN: [id number] Date of Birth: [birth year]  Clinical Social Work is seeking post-discharge placement for this patient at the [place] level of care (*CSW will initial, date and re-position this form in  chart as items are completed):      Patient/family provided with [facility] Work Department's list of facilities offering this level of care within the geographic area requested by the patient (or if unable, by the patient's family).      Patient/family informed of their freedom to choose among providers that offer the needed level of care, that participate in Medicare, Medicaid or managed care program needed by the patient, have an available bed and are willing to accept the patient.      Patient/family informed of [facility]'s ownership interest in [facility], as well as of the fact that they are under no obligation to receive care at these facilities.  PASRR submitted to EDS on 10/08/18     PASRR number received on       Existing PASRR number confirmed on 10/08/18     FL2 transmitted to all facilities in geographic area requested by pt/family on 10/08/18     FL2 transmitted to all facilities within larger geographic area on       Patient informed that his/her managed care company has contracts with or will negotiate with certain facilities, including the following:            Patient/family informed of bed offers received.  Patient chooses bed at       Physician recommends and patient chooses bed at      Patient to be transferred to   on  .  Patient to be transferred to facility by       Patient family notified on   of transfer.  Name of family member notified:        PHYSICIAN Please sign FL2     Additional Comment:    _______________________________________________ Candie Chroman, LCSW 10/08/2018, 1:52  PM

## 2018-10-08 NOTE — Progress Notes (Signed)
Pt refusing bed alarm.
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
## 2018-10-08 NOTE — Progress Notes (Signed)
[facility] Progress Note  Name: Amber Lara MRN: [id number] DOB: [birth year]   Subjective:  Patient refused HD earlier today when unit called for her.  Spoke with the patient who states that she just told them to come back later because she was eating.  I called and spoke with HD unit.  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  She has been lost to follow-up.  She has had mild uremic symptoms, CKD progression and is now with metabolic acidosis.  She does agree to go ahead and start dialysis tomorrow.  Discussed the risks and benefits and indications.  She has a history of being homeless and states her current living arrangement is unsafe. Will need to coordinate an outpatient unit for the patient keeping her social situation in mind.  Would discontinue gabapentin as noncompliant and with hx AMS and ESRD.  For now given that she is starting HD tomorrow AM will go ahead and d/c lasix and bicarbonate and manage volume and acidosis with HD.   Intake/Output Summary (Last 24 hours) at 10/08/2018 1326 Last data filed at 10/08/2018 1000 Gross per 24 hour  Intake 1376 ml  Output 900 ml  Net 476 ml    Vitals:  Vitals:   10/07/18 2216 10/08/18 0210 10/08/18 0437 10/08/18 1107  BP: (!) 153/55  (!) 131/56 (!) 147/60  Pulse: 63  61 62  Resp:   20   Temp:   97.6 F (36.4 C) (!) 97.5 F (36.4 C)  TempSrc:   Oral Oral  SpO2:   96% 100%  Weight:  100.3 kg    Height:         Physical Exam:  General adult female in bed in no acute distress HEENT normocephalic atraumatic extraocular movements intact sclera anicteric Neck supple trachea midline Lungs clear to auscultation bilaterally normal work of breathing at rest  Heart regular rate and rhythm no rubs or gallops  appreciated Abdomen soft nontender nondistended Extremities trace edema LE's Psych normal mood and affect AVF with bruit and thrill    Medications reviewed   Labs:  BMP Latest Ref Rng & Units 10/08/2018 10/07/2018 10/06/2018  Glucose 70 - 99 mg/dL 304(H) 146(H) 265(H)  BUN 6 - 20 mg/dL 75(H) 72(H) 62(H)  Creatinine 0.44 - 1.00 mg/dL 7.33(H) 7.60(H) 6.66(H)  BUN/Creat Ratio 9 - 23 - - -  Sodium 135 - 145 mmol/L 130(L) 136 134(L)  Potassium 3.5 - 5.1 mmol/L 3.9 3.7 3.8  Chloride 98 - 111 mmol/L 102 107 106  CO2 22 - 32 mmol/L 15(L) 18(L) 16(L)  Calcium 8.9 - 10.3 mg/dL 8.2(L) 8.2(L) 8.6(L)     Assessment/Plan:   1.  CKD stage 5 with mild symptoms of uremia.  - refused HD earlier today  - Plan for HD today (after eating), tomorrow, and 1/1.  Assess needs daily - I have spoken with HD unit secretary to start CLIP process to find an outpatient unit   2. Hypertension - acceptable  3. Metabolic acidosis - for HD 4. Anemia  - on oral iron and for ESA once HD initiated   5. DVT R Femoral Vein- Per primary. 6. DM-per primary.  7. Psychosis-cont psych meds per primary. Stop gabapentin.  8. Medical non-compliance and hx homelessness - see that SW is consulted and appreciate their  assistance    Claudia Desanctis, MD 10/08/2018 1:26 PM

## 2018-10-08 NOTE — Care Management (Signed)
#    6.   Jamelle Haring   [facility]  @ [facility]  #  [phone]   ELIQUIS  5 MG BID COVER- YES CO-PAY- $ 45.00   Q/L TWO PILL PER DAY TIER- 3 DRUG PRIOR APPROVAL- NO  PATIENT IN INITIAL COVERAGE  [facility]  PREFERRED PHARMACY : YES [facility] AND [facility] M/O

## 2018-10-09 ENCOUNTER — Inpatient Hospital Stay (HOSPITAL_COMMUNITY): Payer: Medicare Other

## 2018-10-09 ENCOUNTER — Encounter (HOSPITAL_COMMUNITY): Payer: Self-pay | Admitting: Diagnostic Radiology

## 2018-10-09 DIAGNOSIS — I120 Hypertensive chronic kidney disease with stage 5 chronic kidney disease or end stage renal disease: Secondary | ICD-10-CM | POA: Diagnosis present

## 2018-10-09 DIAGNOSIS — E1122 Type 2 diabetes mellitus with diabetic chronic kidney disease: Secondary | ICD-10-CM | POA: Diagnosis present

## 2018-10-09 DIAGNOSIS — N185 Chronic kidney disease, stage 5: Secondary | ICD-10-CM | POA: Diagnosis not present

## 2018-10-09 DIAGNOSIS — N179 Acute kidney failure, unspecified: Principal | ICD-10-CM

## 2018-10-09 DIAGNOSIS — D649 Anemia, unspecified: Secondary | ICD-10-CM | POA: Diagnosis not present

## 2018-10-09 DIAGNOSIS — Z9114 Patient's other noncompliance with medication regimen: Secondary | ICD-10-CM | POA: Diagnosis not present

## 2018-10-09 DIAGNOSIS — Z794 Long term (current) use of insulin: Secondary | ICD-10-CM | POA: Diagnosis not present

## 2018-10-09 DIAGNOSIS — Z23 Encounter for immunization: Secondary | ICD-10-CM | POA: Diagnosis present

## 2018-10-09 DIAGNOSIS — D631 Anemia in chronic kidney disease: Secondary | ICD-10-CM | POA: Diagnosis present

## 2018-10-09 DIAGNOSIS — F17200 Nicotine dependence, unspecified, uncomplicated: Secondary | ICD-10-CM | POA: Diagnosis present

## 2018-10-09 DIAGNOSIS — Z6839 Body mass index (BMI) 39.0-39.9, adult: Secondary | ICD-10-CM | POA: Diagnosis not present

## 2018-10-09 DIAGNOSIS — Z90710 Acquired absence of both cervix and uterus: Secondary | ICD-10-CM | POA: Diagnosis not present

## 2018-10-09 DIAGNOSIS — N2581 Secondary hyperparathyroidism of renal origin: Secondary | ICD-10-CM | POA: Diagnosis present

## 2018-10-09 DIAGNOSIS — E669 Obesity, unspecified: Secondary | ICD-10-CM | POA: Diagnosis present

## 2018-10-09 DIAGNOSIS — N186 End stage renal disease: Secondary | ICD-10-CM | POA: Diagnosis present

## 2018-10-09 DIAGNOSIS — Z8673 Personal history of transient ischemic attack (TIA), and cerebral infarction without residual deficits: Secondary | ICD-10-CM | POA: Diagnosis not present

## 2018-10-09 DIAGNOSIS — I10 Essential (primary) hypertension: Secondary | ICD-10-CM | POA: Diagnosis not present

## 2018-10-09 DIAGNOSIS — E785 Hyperlipidemia, unspecified: Secondary | ICD-10-CM | POA: Diagnosis present

## 2018-10-09 DIAGNOSIS — Z833 Family history of diabetes mellitus: Secondary | ICD-10-CM | POA: Diagnosis not present

## 2018-10-09 DIAGNOSIS — F22 Delusional disorders: Secondary | ICD-10-CM | POA: Diagnosis present

## 2018-10-09 DIAGNOSIS — R569 Unspecified convulsions: Secondary | ICD-10-CM | POA: Diagnosis not present

## 2018-10-09 DIAGNOSIS — Z8249 Family history of ischemic heart disease and other diseases of the circulatory system: Secondary | ICD-10-CM | POA: Diagnosis not present

## 2018-10-09 DIAGNOSIS — Z79899 Other long term (current) drug therapy: Secondary | ICD-10-CM | POA: Diagnosis not present

## 2018-10-09 DIAGNOSIS — Z841 Family history of disorders of kidney and ureter: Secondary | ICD-10-CM | POA: Diagnosis not present

## 2018-10-09 DIAGNOSIS — E872 Acidosis: Secondary | ICD-10-CM | POA: Diagnosis present

## 2018-10-09 DIAGNOSIS — E1165 Type 2 diabetes mellitus with hyperglycemia: Secondary | ICD-10-CM | POA: Diagnosis present

## 2018-10-09 DIAGNOSIS — G40909 Epilepsy, unspecified, not intractable, without status epilepticus: Secondary | ICD-10-CM | POA: Diagnosis present

## 2018-10-09 DIAGNOSIS — I82411 Acute embolism and thrombosis of right femoral vein: Secondary | ICD-10-CM | POA: Diagnosis present

## 2018-10-09 HISTORY — PX: IR US GUIDE VASC ACCESS RIGHT: IMG2390

## 2018-10-09 HISTORY — PX: IR DIALY SHUNT INTRO NEEDLE/INTRACATH INITIAL W/IMG RIGHT: IMG6115

## 2018-10-09 LAB — BASIC METABOLIC PANEL
Anion gap: 12 (ref 5–15)
BUN: 80 mg/dL — ABNORMAL HIGH (ref 6–20)
CALCIUM: 8.2 mg/dL — AB (ref 8.9–10.3)
CO2: 14 mmol/L — ABNORMAL LOW (ref 22–32)
Chloride: 104 mmol/L (ref 98–111)
Creatinine, Ser: 7.63 mg/dL — ABNORMAL HIGH (ref 0.44–1.00)
GFR calc Af Amer: 6 mL/min — ABNORMAL LOW (ref >60)
GFR calc non Af Amer: 5 mL/min — ABNORMAL LOW (ref >60)
Glucose, Bld: 284 mg/dL — ABNORMAL HIGH (ref 70–99)
Potassium: 4 mmol/L (ref 3.5–5.1)
Sodium: 130 mmol/L — ABNORMAL LOW (ref 135–145)

## 2018-10-09 LAB — GLUCOSE, CAPILLARY
GLUCOSE-CAPILLARY: 383 mg/dL — AB (ref 70–99)
Glucose-Capillary: 248 mg/dL — ABNORMAL HIGH (ref 70–99)
Glucose-Capillary: 136 mg/dL — ABNORMAL HIGH (ref 70–99)
Glucose-Capillary: 188 mg/dL — ABNORMAL HIGH (ref 70–99)

## 2018-10-09 LAB — HEPATITIS B SURFACE ANTIGEN: Hepatitis B Surface Ag: NEGATIVE

## 2018-10-09 LAB — CBC
HCT: 26 % — ABNORMAL LOW (ref 36.0–46.0)
HEMOGLOBIN: 8.3 g/dL — AB (ref 12.0–15.0)
MCH: 25.9 pg — ABNORMAL LOW (ref 26.0–34.0)
MCHC: 31.9 g/dL (ref 30.0–36.0)
MCV: 81 fL (ref 80.0–100.0)
Platelets: 221 10*3/uL (ref 150–400)
RBC: 3.21 MIL/uL — ABNORMAL LOW (ref 3.87–5.11)
RDW: 14.6 % (ref 11.5–15.5)
WBC: 6 10*3/uL (ref 4.0–10.5)
nRBC: 0 % (ref 0.0–0.2)

## 2018-10-09 LAB — HEPATITIS B CORE ANTIBODY, TOTAL: Hep B Core Total Ab: NEGATIVE

## 2018-10-09 LAB — APTT
aPTT: 59 seconds — ABNORMAL HIGH (ref 24–36)
aPTT: 64 seconds — ABNORMAL HIGH (ref 24–36)

## 2018-10-09 LAB — PTH, INTACT AND CALCIUM
Calcium, Total (PTH): 8.3 mg/dL — ABNORMAL LOW (ref 8.7–10.2)
PTH: 68 pg/mL — ABNORMAL HIGH (ref 15–65)

## 2018-10-09 LAB — HEPARIN LEVEL (UNFRACTIONATED)
Heparin Unfractionated: 1.68 IU/mL — ABNORMAL HIGH (ref 0.30–0.70)
Heparin Unfractionated: 1.4 IU/mL — ABNORMAL HIGH (ref 0.30–0.70)

## 2018-10-09 LAB — HEPATITIS B SURFACE ANTIBODY,QUALITATIVE: Hep B S Ab: REACTIVE

## 2018-10-09 MED ORDER — CHLORHEXIDINE GLUCONATE CLOTH 2 % EX PADS: 6.0000 | MEDICATED_PAD | Freq: Every day | CUTANEOUS | Status: DC

## 2018-10-09 MED ORDER — INSULIN ASPART 100 UNIT/ML ~~LOC~~ SOLN
3.0000 [IU] | Freq: Three times a day (TID) | SUBCUTANEOUS | Status: DC
  Administered 2018-10-09 – 2018-10-16 (×14): 3 [IU] via SUBCUTANEOUS

## 2018-10-09 MED ORDER — INSULIN GLARGINE 100 UNIT/ML ~~LOC~~ SOLN
30.0000 [IU] | Freq: Every day | SUBCUTANEOUS | Status: DC
  Administered 2018-10-09 – 2018-10-15 (×7): 30 [IU] via SUBCUTANEOUS
  Filled 2018-10-09 (×7): qty 0.3

## 2018-10-09 MED ORDER — IOPAMIDOL (ISOVUE-300) INJECTION 61%
INTRAVENOUS | Status: AC
  Administered 2018-10-09: 30 mL
  Filled 2018-10-09: qty 100

## 2018-10-09 MED ORDER — SODIUM BICARBONATE 650 MG PO TABS
1300.0000 mg | ORAL_TABLET | Freq: Once | ORAL | Status: AC
  Administered 2018-10-09: 1300 mg via ORAL
  Filled 2018-10-09: qty 2

## 2018-10-09 NOTE — Progress Notes (Signed)
PROGRESS NOTE  Amber Lara ESP:[id number] DOB: 07-12-1961 DOA: 10/05/2018 PCP: Patient, [facility] P[facility]  Brief History   57 year old woman PMH CKD stage V presented with chest pain and right lower extremity pain.  Found to have acute right lower extremity DVT, worsening renal function.  Assessment/Plan Acute right lower extremity DVT.  No obvious risk factors identified, no recent travel.  Initially treated with heparin and then transition to apixaban, however transitioned back to heparin in preparation for procedure. --Continue heparin until after procedure then resume Eliquis.  Acute kidney injury superimposed on CKD stage V with normal anion gap metabolic acidosis.  Failed to improve significantly with Lasix, seen by nephrology, plan for dialysis. --Management per nephrology --Fistula with multiple clots, plan for fistulogram and possible thrombectomy today.  Chest pain, shortness of breath, resolved.  No PE on VQ scan.  EKG nonacute.  Troponins normal. --No further evaluation planned  Left flank pain, CT renal study nonspecific, suggestion of ureteritis, however patient not symptomatic, no role for antibiotics per Dr. [name]  Essential hypertension --Stable.  Continue amlodipine and metoprolol  Diabetes mellitus type 2, uncontrolled, hemoglobin A1c 11.8 in August, noncompliant with Lantus as an outpatient, noncompliant with diet --CBG poor control, fasting 248.  Increase Lantus, dietitian consult  Anemia CKD --Stable.  Follow periodically.  PMH psychosis, paranoia and delusional thoughts, noncompliant with Cogentin --Mentation stable at this time.  Continue Cogentin.  Seizure disorder --Continue Keppra  DVT prophylaxis: Heparin, then apixaban after procedure Code Status: Full code Family Communication: discussed with patient Disposition Plan: SNF per PT   [name], MD  [facility] Direct contact: [phone] --Via  amion app OR  --[email or web address]; password [email or web address]  7PM-7AM contact night coverage as above 10/09/2018, 8:38 AM  LOS: 0 days   Consultants:  Nephrology  Procedures:    Antimicrobials:    Interval history/Subjective: Follow-up acute DVT, AKI  Afebrile, vital signs stable  Feels okay today, no nausea or vomiting, breathing okay.  Some pain in back.  Right leg feels okay.  Objective: Vitals:  Vitals:   10/08/18 2234 10/09/18 0523  BP: (!) 158/53 (!) 142/63  Pulse: 65 (!) 59  Resp:  18  Temp:  97.8 F (36.6 C)  SpO2:  98%    Exam:  Constitutional:  . Appears calm and comfortable lying in bed Eyes:  . pupils and irises appear normal . Normal lids  ENMT:  . grossly normal hearing  Respiratory:  . CTA bilaterally, no w/r/r.  . Respiratory effort normal.  Cardiovascular:  . RRR, 2/6 systolic murmur, no rub or gallop . 1+ left LE extremity edema   . Telemetry sinus rhythm Abdomen:  . Soft, nontender, nondistended Musculoskeletal:  . Digits/nails BUE: no clubbing, cyanosis, petechiae, infection Psychiatric:  . Mental status o Mood, affect appropriate  I have personally reviewed the following:   Data: . Urine output 800 . CBG 206-301.  Fasting 248 . Sodium 130, no change, CO2 14, BUN 80, creatinine 7.63, no significant change compared to yesterday's study . Hemoglobin stable at 8.3, remainder CBC unremarkable  Scheduled Meds: . amLODipine  10 mg Oral Daily  . atorvastatin  40 mg Oral q1800  . benztropine  2 mg Oral BID  . Chlorhexidine Gluconate Cloth  6 each Topical Q0600  . Chlorhexidine Gluconate Cloth  6 each Topical Q0600  . Ferrous Fumarate  1 tablet Oral Daily  . insulin aspart  0-9 Units Subcutaneous TID WC  . insulin aspart  3 Units Subcutaneous TID WC  . insulin glargine  30 Units Subcutaneous QHS  . levETIRAcetam  500 mg Oral BID  . metoprolol tartrate  50 mg Oral BID   Continuous Infusions: . heparin 1,050 Units/hr (10/09/18 0231)     Principal Problem:   Acute renal failure (ARF) (HCC) Active Problems:   Type 2 diabetes mellitus with hyperglycemia (HCC)   Normochromic normocytic anemia   Seizure (HCC)   CKD (chronic kidney disease), stage V (HCC)   Acute deep vein thrombosis (DVT) of femoral vein of right lower extremity (HCC)   ESRD (end stage renal disease) ([place])   LOS: 0 days

## 2018-10-09 NOTE — Progress Notes (Signed)
ANTICOAGULATION CONSULT NOTE - Initial Consult  Pharmacy Consult for heparin Indication: DVT  Allergies  Allergen Reactions  . Penicillins Anaphylaxis and Hives    Has patient had a PCN reaction causing immediate rash, facial/tongue/throat swelling, SOB or lightheadedness with hypotension: yes Has patient had a PCN reaction causing severe rash involving mucus membranes or skin n24} If all of the above answers are "NO", then may proceed with Cephalosporin use.ecrosis: no Has patient had a PCN reaction that required hospitalization: no Has patient had a PCN reaction occurring within the last 10 years:no  . Tetracyclines & Related Anaphylaxis and Hives  . Morphine And Related Hives  . Amoxicillin     Same as penicillin  . Latex Itching and Rash  . Tape Itching and Rash    Patient Measurements: Height: 5\' 3"  (160 cm) Weight: 217 lb 6.4 oz (98.6 kg)(scale a) IBW/kg (Calculated) : 52.4 Heparin Dosing Weight: 75 kg  Vital Signs: Temp: 97.8 F (36.6 C) (12/31 0523) Temp Source: Oral (12/31 0523) BP: 142/63 (12/31 0523) Pulse Rate: 59 (12/31 0523)  Labs: Recent Labs    10/06/18 0934 10/06/18 1145 10/07/18 0540 10/08/18 0443 10/09/18 0710  HGB  --   --   --  8.3* 8.3*  HCT  --   --   --  25.4* 26.0*  PLT  --   --   --  239 221  APTT  --   --   --   --  59*  HEPARINUNFRC 0.67  --   --   --  1.68*  CREATININE  --   --  7.60* 7.33*  --   TROPONINI  --  <0.03  --   --   --     Estimated Creatinine Clearance: 9.6 mL/min (A) (by C-G formula based on SCr of 7.33 mg/dL (H)).   Medical History: Past Medical History:  Diagnosis Date  . Arthritis   . Chronic anemia   . CKD (chronic kidney disease), stage V ([place])   . Diabetes mellitus ([place])   . History of stroke   . Hypertension   . Obesity   . Psychosis ([place])    a. prior adm in [place] ([place]) in 2015 where pt was admitted with hallucinations, seeing men in black outfits, family members on a non-existent  camera, hearing voices and seeing animals - UDS positive for methamphetamines at that time and patient stated she believed someone had put this in her body.  . Seizures ([place])     Medications:  Scheduled:  . amLODipine  10 mg Oral Daily  . atorvastatin  40 mg Oral q1800  . benztropine  2 mg Oral BID  . Chlorhexidine Gluconate Cloth  6 each Topical Q0600  . Chlorhexidine Gluconate Cloth  6 each Topical Q0600  . Ferrous Fumarate  1 tablet Oral Daily  . insulin aspart  0-9 Units Subcutaneous TID WC  . insulin glargine  24 Units Subcutaneous QHS  . levETIRAcetam  500 mg Oral BID  . metoprolol tartrate  50 mg Oral BID    Assessment: 59 yof presented with CP and pain in R LE, found to have R LE DVT. Started on apixaban treatment dose - last dose on 12/30@1003 .  Plan to undergoing fistulogram and possible thrombectomy by IR - will change to heparin infusion.   Given recent apixaban dosing, will order both aPTT and heparin level to monitor until correlate.  Hgb 8.3, plt 221. No s/sx of bleeding. APTT this am 59 secs  which is slightly subtherapeutic and HL 1.68 (elevated due to apixaban)  Goal of Therapy:  APTT 66 - 102 secs Heparin level 0.3-0.7 units/ml Monitor platelets by anticoagulation protocol: Yes   Plan:  Increase heparin infusion at 1150 units/hr  Check anti-Xa level in 8 hours and daily while on heparin Continue to monitor H&H and platelets  Alanda Slim, PharmD, [facility] Clinical Pharmacist Please see AMION for all Pharmacists' Contact Phone Numbers 10/09/2018, 8:15 AM

## 2018-10-09 NOTE — Clinical Social Work Note (Signed)
Patient sleeping. CSW unable to wake her up long enough to have a conversation. Told her CSW was leaving bed offers and CMS scores on table for her to review. Will follow up later for decision. She currently has three bed offers: [facility], [facility].  [facility], [place]

## 2018-10-09 NOTE — Progress Notes (Signed)
Arrived to place IV but patient is off the floor for procedure.  Order completed and Sharin Grave to reorder if needed when she returns.  Carolee Rota, RN VAST

## 2018-10-09 NOTE — Plan of Care (Signed)
  Problem: Activity: Goal: Capacity to carry out activities will improve Outcome: Progressing   Problem: Cardiac: Goal: Ability to achieve and maintain adequate cardiopulmonary perfusion will improve Outcome: Progressing   Problem: Clinical Measurements: Goal: Ability to maintain clinical measurements within normal limits will improve Outcome: Progressing Goal: Will remain free from infection Outcome: Progressing Goal: Diagnostic test results will improve Outcome: Progressing Goal: Respiratory complications will improve Outcome: Progressing Goal: Cardiovascular complication will be avoided Outcome: Progressing   Problem: Activity: Goal: Risk for activity intolerance will decrease Outcome: Progressing   Problem: Nutrition: Goal: Adequate nutrition will be maintained Outcome: Progressing   Problem: Elimination: Goal: Will not experience complications related to bowel motility Outcome: Progressing Goal: Will not experience complications related to urinary retention Outcome: Progressing   Problem: Safety: Goal: Ability to remain free from injury will improve Outcome: Progressing   Problem: Skin Integrity: Goal: Risk for impaired skin integrity will decrease Outcome: Progressing   Problem: Coping: Goal: Ability to adjust to condition or change in health will improve Outcome: Progressing   Problem: Fluid Volume: Goal: Ability to maintain a balanced intake and output will improve Outcome: Progressing   Problem: Health Behavior/Discharge Planning: Goal: Ability to identify and utilize available resources and services will improve Outcome: Progressing Goal: Ability to manage health-related needs will improve Outcome: Progressing   Problem: Metabolic: Goal: Ability to maintain appropriate glucose levels will improve Outcome: Progressing   Problem: Nutritional: Goal: Maintenance of adequate nutrition will improve Outcome: Progressing Goal: Progress toward achieving  an optimal weight will improve Outcome: Progressing   Problem: Education: Goal: Knowledge of disease and its progression will improve Outcome: Progressing   Problem: Fluid Volume: Goal: Compliance with measures to maintain balanced fluid volume will improve Outcome: Progressing   Problem: Health Behavior/Discharge Planning: Goal: Ability to manage health-related needs will improve Outcome: Progressing   Problem: Nutritional: Goal: Ability to make healthy dietary choices will improve Outcome: Progressing   Problem: Clinical Measurements: Goal: Complications related to the disease process, condition or treatment will be avoided or minimized Outcome: Progressing   Problem: Education: Goal: Ability to demonstrate management of disease process will improve Outcome: Not Met (add Reason) Goal: Ability to verbalize understanding of medication therapies will improve Outcome: Not Met (add Reason) Goal: Individualized Educational Video(s) Outcome: Not Met (add Reason)   Problem: Education: Goal: Knowledge of General Education information will improve Description Including pain rating scale, medication(s)/side effects and non-pharmacologic comfort measures Outcome: Not Met (add Reason)   Problem: Health Behavior/Discharge Planning: Goal: Ability to manage health-related needs will improve Outcome: Not Met (add Reason)   Problem: Coping: Goal: Level of anxiety will decrease Outcome: Not Met (add Reason)   Problem: Pain Managment: Goal: General experience of comfort will improve Outcome: Not Met (add Reason)   Problem: Education: Goal: Ability to describe self-care measures that may prevent or decrease complications (Diabetes Survival Skills Education) will improve Outcome: Not Met (add Reason) Goal: Individualized Educational Video(s) Outcome: Not Met (add Reason)   Problem: Education: Goal: Individualized Educational Video(s) Outcome: Not Met (add Reason)

## 2018-10-09 NOTE — Consult Note (Signed)
Chief Complaint: Patient was seen in consultation today for right arm dialysis fistula declot Chief Complaint  Patient presents with  . Hyperglycemia   at the request of Dr Katheren Puller  Supervising Physician: Markus Daft  Patient Status: [facility] - In-pt  History of Present Illness: Amber Lara is a 57 y.o. female   CKDV Right upper arm fistula was placed 05/2018 Anticipation of starting dialysis soon--lost to follow up Now with renal disease progression and metabolic acidosis  Dialysis session yesterday (first use per pt)-- "pulling clots"  Request now for fistulogram with possible thrombolysis/angioplasty/stent placement-- scheduled for today in IR Pt adamantly refuses tunneled catheter placement if were needed.  New RLE DVT-- on heparin   Past Medical History:  Diagnosis Date  . Arthritis   . Chronic anemia   . CKD (chronic kidney disease), stage V ([place])   . Diabetes mellitus ([place])   . History of stroke   . Hypertension   . Obesity   . Psychosis ([place])    a. prior adm in [place] ([place]) in 2015 where pt was admitted with hallucinations, seeing men in black outfits, family members on a non-existent camera, hearing voices and seeing animals - UDS positive for methamphetamines at that time and patient stated she believed someone had put this in her body.  . Seizures ([place])     Past Surgical History:  Procedure Laterality Date  . ABDOMINAL HYSTERECTOMY    . AV FISTULA PLACEMENT Right 06/07/2018   Procedure: CREATION OF RIGHT BRACHIOCEPHALIC ARTERIOVENOUS FISTULA;  Surgeon: Serafina Mitchell, MD;  Location: [place];  Service: Vascular;  Laterality: Right;    Allergies: Penicillins; Tetracyclines & related; Morphine and related; Amoxicillin; Latex; and Tape  Medications: Prior to Admission medications   Medication Sig Start Date End Date Taking? Authorizing Provider  acetaminophen (TYLENOL) 325 MG tablet Take 2 tablets (650 mg total) by mouth every 6  (six) hours as needed for mild pain or moderate pain (or Fever >/= 101). 07/15/17  Yes Hongalgi, Lenis Dickinson, MD  amLODipine (NORVASC) 10 MG tablet Take 1 tablet (10 mg total) by mouth daily. 07/23/18  Yes Scot Jun, FNP  atorvastatin (LIPITOR) 40 MG tablet Take 1 tablet (40 mg total) by mouth daily at 6 PM. 07/19/18  Yes Scot Jun, FNP  benztropine (COGENTIN) 2 MG tablet Take 2 mg by mouth 2 (two) times daily. 03/24/17  Yes [provider]  Ferrous Fumarate (HEMOCYTE - 106 MG FE) 324 (106 Fe) MG TABS tablet Take 1 tablet (106 mg of iron total) by mouth daily. 07/25/18  Yes Scot Jun, FNP  gabapentin (NEURONTIN) 100 MG capsule Take 1 capsule (100 mg total) by mouth 2 (two) times daily. 07/19/18  Yes Scot Jun, FNP  insulin aspart (NOVOLOG) 100 UNIT/ML injection Inject 3 times daily based on sliding scale: 125=0 units 126-180=1 unit, 181-200=2 units 201-250 administer= 4 units 251-300 administer =6 units 301 or more 8 units-call provider 07/23/18  Yes Scot Jun, FNP  insulin glargine (LANTUS) 100 UNIT/ML injection Inject 0.5 mLs (50 Units total) into the skin at bedtime. 07/19/18 11/16/18 Yes Scot Jun, FNP  levETIRAcetam (KEPPRA) 500 MG tablet Take 1 tablet (500 mg total) by mouth 2 (two) times daily. 07/19/18 10/05/18 Yes Scot Jun, FNP  metoprolol tartrate (LOPRESSOR) 50 MG tablet Take 1 tablet (50 mg total) by mouth 2 (two) times daily. 07/19/18 10/05/18 Yes Scot Jun, FNP  Multiple Vitamin (MULTIVITAMIN WITH MINERALS) TABS  tablet Take 1 tablet by mouth daily.   Yes [provider]  promethazine (PHENERGAN) 25 MG tablet Take 1 tablet (25 mg total) by mouth every 6 (six) hours as needed for nausea or vomiting. 07/19/18  Yes Scot Jun, FNP  blood glucose meter kit and supplies KIT Dispense based on patient and insurance preference. Use up to four times daily as directed. (FOR ICD-9 250.00, 250.01). 07/23/18    Scot Jun, FNP  furosemide (LASIX) 20 MG tablet Take 2 tablets (40 mg total) by mouth daily. Patient not taking: Reported on 10/05/2018 06/07/18 07/07/18  Elodia Florence., MD  Insulin Pen Needle (NOVOFINE) 30G X 8 MM MISC Inject insulin into skin per units prescribed. 07/19/17   Scot Jun, FNP  Insulin Syringe-Needle U-100 (TRUEPLUS INSULIN SYRINGE) 31G X 5/16" 0.3 ML MISC Use as directed 07/19/18   Scot Jun, FNP     Family History  Problem Relation Age of Onset  . Diabetes Mellitus II Mother   . CAD Mother        First MI in her 52s, passed away before age 8  . Renal Disease Mother   . Diabetes Mellitus II Sister   . CAD Sister        Stents in her early 19s  . Renal Disease Sister   . Diabetes Mellitus II Brother     Social History   Socioeconomic History  . Marital status: Widowed    Spouse name: Not on file  . Number of children: Not on file  . Years of education: Not on file  . Highest education level: Not on file  Occupational History  . Not on file  Social Needs  . Financial resource strain: Not on file  . Food insecurity:    Worry: Not on file    Inability: Not on file  . Transportation needs:    Medical: Not on file    Non-medical: Not on file  Tobacco Use  . Smoking status: Current Some Day Smoker  . Smokeless tobacco: Never Used  Substance and Sexual Activity  . Alcohol use: No  . Drug use: No  . Sexual activity: Not on file  Lifestyle  . Physical activity:    Days per week: Not on file    Minutes per session: Not on file  . Stress: Not on file  Relationships  . Social connections:    Talks on phone: Not on file    Gets together: Not on file    Attends religious service: Not on file    Active member of club or organization: Not on file    Attends meetings of clubs or organizations: Not on file    Relationship status: Not on file  Other Topics Concern  . Not on file  Social History Narrative  . Not on file     Review of Systems: A 12 point ROS discussed and pertinent positives are indicated in the HPI above.  All other systems are negative.  Review of Systems  Constitutional: Positive for fatigue. Negative for activity change and unexpected weight change.  Respiratory: Negative for shortness of breath.   Cardiovascular: Negative for chest pain.  Gastrointestinal: Negative for abdominal pain.  Musculoskeletal: Positive for gait problem.  Neurological: Positive for weakness.  Psychiatric/Behavioral: Negative for behavioral problems and confusion.    Vital Signs: BP (!) 144/63   Pulse 61   Temp 97.8 F (36.6 C) (Oral)   Resp 18   Ht 5'  3" (1.6 m)   Wt 217 lb 6.4 oz (98.6 kg) Comment: scale a  SpO2 98%   BMI 38.51 kg/m   Physical Exam Vitals signs reviewed.  Cardiovascular:     Rate and Rhythm: Normal rate and regular rhythm.  Pulmonary:     Effort: Pulmonary effort is normal.     Breath sounds: Normal breath sounds.  Abdominal:     General: Bowel sounds are normal.     Palpations: Abdomen is soft.  Musculoskeletal: Normal range of motion.     Comments: Right arm fistula +thrill; +pulse  Skin:    General: Skin is warm and dry.  Neurological:     General: No focal deficit present.     Mental Status: She is oriented to person, place, and time.  Psychiatric:        Mood and Affect: Mood normal.        Behavior: Behavior normal.        Thought Content: Thought content normal.        Judgment: Judgment normal.     Imaging: Dg Chest 2 View  Result Date: 10/05/2018 CLINICAL DATA:  Chest pain EXAM: CHEST - 2 VIEW COMPARISON:  08/14/2018 FINDINGS: The heart size and mediastinal contours are within normal limits. Both lungs are clear. The visualized skeletal structures are unremarkable. IMPRESSION: No active cardiopulmonary disease. Electronically Signed   By: Ashley Royalty M.D.   On: 10/05/2018 18:24   Nm Pulmonary Vent And Perf (v/q Scan)  Result Date: 10/06/2018 CLINICAL  DATA:  57 year old admitted yesterday for chest pain and RIGHT LOWER extremity pain and swelling. Chronic renal insufficiency precluded IV contrast for CTA chest. EXAM: NUCLEAR MEDICINE VENTILATION - PERFUSION LUNG SCAN TECHNIQUE: Ventilation images were obtained in multiple projections using inhaled aerosol Tc-33mDTPA. Perfusion images were obtained in multiple projections after intravenous injection of Tc-911mAA. RADIOPHARMACEUTICALS:  31.1 mCi of Tc-9933mPA aerosol inhalation and 4.2 mCi Tc99m60m IV COMPARISON:  No prior nuclear imaging. Chest x-ray yesterday is correlated. FINDINGS: Ventilation: No focal ventilation defect. Aerosol deposition in the trachea is noted. Perfusion: Normal pulmonary perfusion. No perfusion defects in either lung. IMPRESSION: Normal examination. Electronically Signed   By: ThomEvangeline Dakin.   On: 10/06/2018 10:13   Ct Renal Stone Study  Result Date: 10/06/2018 CLINICAL DATA:  Acute onset of flank pain. EXAM: CT ABDOMEN AND PELVIS WITHOUT CONTRAST TECHNIQUE: Multidetector CT imaging of the abdomen and pelvis was performed following the standard protocol without IV contrast. COMPARISON:  CT of the abdomen and pelvis from 07/15/2018 FINDINGS: Lower chest: Mild bibasilar atelectasis is noted. The visualized portions of the mediastinum are unremarkable. Hepatobiliary: The liver is unremarkable in appearance. The gallbladder is unremarkable in appearance. The common bile duct remains normal in caliber. Pancreas: The pancreas is within normal limits. Spleen: The spleen is unremarkable in appearance. Adrenals/Urinary Tract: The adrenal glands are unremarkable in appearance. No significant hydronephrosis is seen. There is suggestion of mild wall thickening at the left renal pelvis, which could reflect mild ureteritis. Mild nonspecific perinephric stranding is noted bilaterally. No renal or ureteral stones are identified. Stomach/Bowel: The stomach is unremarkable in appearance.  The small bowel is within normal limits. The appendix is normal in caliber, without evidence of appendicitis. The colon is unremarkable in appearance. Vascular/Lymphatic: The abdominal aorta is unremarkable in appearance. The inferior vena cava is grossly unremarkable. No retroperitoneal lymphadenopathy is seen. No pelvic sidewall lymphadenopathy is identified. Reproductive: The bladder is mildly distended and grossly  unremarkable. The patient is status post hysterectomy. No suspicious adnexal masses are seen. Other: No additional soft tissue abnormalities are seen. Musculoskeletal: No acute osseous abnormalities are identified. The visualized musculature is unremarkable in appearance. IMPRESSION: 1. Suggestion of mild wall thickening at the left renal pelvis, which could reflect mild ureteritis. No evidence of hydronephrosis. 2. Mild bibasilar atelectasis noted. Electronically Signed   By: Garald Balding M.D.   On: 10/06/2018 03:46   Vas [place] Lower Extremity Venous (dvt) (mc And Wl 7a-7p)  Result Date: 10/07/2018  Lower Venous Study Indications: Pain, Swelling, and Edema.  Limitations: Body habitus and severe pain. Comparison Study: No prior study available Performing Technologist: Lorina Rabon  Examination Guidelines: A complete evaluation includes B-mode imaging, spectral Doppler, color Doppler, and power Doppler as needed of all accessible portions of each vessel. Bilateral testing is considered an integral part of a complete examination. Limited examinations for reoccurring indications may be performed as noted.  Right Venous Findings: +---------+---------------+---------+-----------+---------------+--------------+          CompressibilityPhasicitySpontaneityProperties     Summary        +---------+---------------+---------+-----------+---------------+--------------+ CFV      Full           Yes      Yes                                       +---------+---------------+---------+-----------+---------------+--------------+ SFJ      Full                                                             +---------+---------------+---------+-----------+---------------+--------------+ FV Prox  Full                                                             +---------+---------------+---------+-----------+---------------+--------------+ FV Mid   Partial                 Yes        partially      Acute                                                      re-cannalized                 +---------+---------------+---------+-----------+---------------+--------------+ FV DistalPartial                 Yes        partially      Acute                                                      re-cannalized                 +---------+---------------+---------+-----------+---------------+--------------+ PFV  Full                                                             +---------+---------------+---------+-----------+---------------+--------------+ POP      Full           Yes      Yes                                      +---------+---------------+---------+-----------+---------------+--------------+ PTV                              Yes                       poor                                                                      visibility                                                                with                                                                      compression    +---------+---------------+---------+-----------+---------------+--------------+ PERO                                                       not well                                                                  visualized     +---------+---------------+---------+-----------+---------------+--------------+  Left Venous Findings: +---+---------------+---------+-----------+----------+-------+     CompressibilityPhasicitySpontaneityPropertiesSummary +---+---------------+---------+-----------+----------+-------+ CFVFull           Yes      Yes                          +---+---------------+---------+-----------+----------+-------+    Summary: Right: Findings consistent with acute deep vein thrombosis involving the right femoral vein. No cystic structure found in the popliteal fossa. Posterior tibial and peroneal veins with  very limited visibility when compress, study cannot completely exclude  the possible existence of thrombosis at calf veins even though flow detected. Left: No evidence of common femoral vein obstruction.  *See table(s) above for measurements and observations. Electronically signed by Curt Jews MD on 10/07/2018 at 9:06:12 AM.    Final     Labs:  CBC: Recent Labs    10/05/18 1910 10/06/18 0459 10/08/18 0443 10/09/18 0710  WBC 7.5 6.8 6.7 6.0  HGB 10.2* 9.0* 8.3* 8.3*  HCT 32.7* 27.6* 25.4* 26.0*  PLT 281 245 239 221    COAGS: Recent Labs    06/07/18 0205 10/05/18 2019 10/09/18 0710  INR 1.07 1.04  --   APTT  --  31 59*    BMP: Recent Labs    10/06/18 0459 10/07/18 0540 10/08/18 0443 10/09/18 0710  NA 134* 136 130* 130*  K 3.8 3.7 3.9 4.0  CL 106 107 102 104  CO2 16* 18* 15* 14*  GLUCOSE 265* 146* 304* 284*  BUN 62* 72* 75* 80*  CALCIUM 8.6* 8.2* 8.2*  8.3* 8.2*  CREATININE 6.66* 7.60* 7.33* 7.63*  GFRNONAA 6* 5* 6* 5*  GFRAA 7* 6* 7* 6*    LIVER FUNCTION TESTS: Recent Labs    07/15/18 2111 09/21/18 0451 10/06/18 0459 10/08/18 0443  BILITOT 0.6 0.6 0.3  --   AST 16 15 10*  --   ALT 12 15 9   --   ALKPHOS 88 139* 99  --   PROT 5.8* 6.9 5.4*  --   ALBUMIN 2.6* 2.8* 2.2* 2.1*    TUMOR MARKERS: No results for input(s): AFPTM, CEA, CA199, CHROMGRNA in the last 8760 hours.  Assessment and Plan:  ESRD Right arm fistula-- first,new use yesterday Clotting per dialysis RN Scheduled for right arm dialysis fistula  evaluation with possible thrombolysis /angioplasty/stent placement Risks and benefits discussed with the patient including, but not limited to bleeding, infection, vascular injury, pulmonary embolism, need for tunneled HD catheter placement or even death.  All of the patient's questions were answered, patient is agreeable to proceed. Refusing placement of tunneled dialysis catheter Consent signed and in chart.  Pt is on Heparin for RLE DVT-- will dc before procedure   Thank you for this interesting consult.  I greatly enjoyed meeting [facility] and look forward to participating in their care.  A copy of this report was sent to the requesting provider on this date.  Electronically Signed: Lavonia Drafts, PA-C 10/09/2018, 9:35 AM   I spent a total of 40 Minutes    in face to face in clinical consultation, greater than 50% of which was counseling/coordinating care for right arm fistula declot

## 2018-10-09 NOTE — Progress Notes (Signed)
ANTICOAGULATION CONSULT NOTE  Pharmacy Consult:  Heparin Indication: DVT  Allergies  Allergen Reactions  . Penicillins Anaphylaxis and Hives    Has patient had a PCN reaction causing immediate rash, facial/tongue/throat swelling, SOB or lightheadedness with hypotension: yes Has patient had a PCN reaction causing severe rash involving mucus membranes or skin n24} If all of the above answers are "NO", then may proceed with Cephalosporin use.ecrosis: no Has patient had a PCN reaction that required hospitalization: no Has patient had a PCN reaction occurring within the last 10 years:no  . Tetracyclines & Related Anaphylaxis and Hives  . Morphine And Related Hives  . Amoxicillin     Same as penicillin  . Latex Itching and Rash  . Tape Itching and Rash    Patient Measurements: Height: 5\' 3"  (160 cm) Weight: 217 lb 6.4 oz (98.6 kg)(scale a) IBW/kg (Calculated) : 52.4 Heparin Dosing Weight: 75 kg  Vital Signs: Temp: 97.5 F (36.4 C) (12/31 1941) Temp Source: Oral (12/31 1941) BP: 163/76 (12/31 1941) Pulse Rate: 71 (12/31 1941)  Labs: Recent Labs    10/07/18 0540 10/08/18 0443 10/09/18 0710 10/09/18 1934  HGB  --  8.3* 8.3*  --   HCT  --  25.4* 26.0*  --   PLT  --  239 221  --   APTT  --   --  59* 64*  HEPARINUNFRC  --   --  1.68* 1.40*  CREATININE 7.60* 7.33* 7.63*  --     Estimated Creatinine Clearance: 9.2 mL/min (A) (by C-G formula based on SCr of 7.63 mg/dL (H)).    Assessment: 89 YOF presented with CP and pain in RLE, found to have RLE DVT. Started on apixaban treatment dose - last dose on 12/30 at 1003.  Plan to undergo fistulogram and possible thrombectomy by IR, so was changed to IV heparin.    Given recent apixaban dosing, will use aPTT to guide heparin dosing.  APTT is slightly sub-therapeutic and heparin level is elevated as expected.  No bleeding reported.   Goal of Therapy:  APTT 66 - 102 secs Heparin level 0.3-0.7 units/ml Monitor platelets by  anticoagulation protocol: Yes    Plan:  Increase heparin infusion at 1250 units/hr  Check 8 hr heparin level and aPTT   Mandy Peeks D. Mina Marble, PharmD, BCPS, [place] 10/09/2018, 9:15 PM

## 2018-10-09 NOTE — Progress Notes (Signed)
Pt is NPO for IR procedure, now demanding the staff to feed her, MD paged,confirmed with RN to maintain NPO status.

## 2018-10-09 NOTE — Progress Notes (Signed)
[facility] Progress Note  Name: Sister Carbone MRN: [id number] DOB: May 28, 1961   Subjective:  She just got back from her fistulogram - fistula found to be patent and ok to use.  She asks if she can wait until the AM to start dialysis as she has not eaten today and is tired.    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  She has been lost to follow-up.  She has had mild uremic symptoms, CKD progression and is now with metabolic acidosis.  She does agree to go ahead and start dialysis tomorrow.  Discussed the risks and benefits and indications.  She has a history of being homeless and states her current living arrangement is unsafe. Will need to coordinate an outpatient unit for the patient keeping her social situation in mind.  Would discontinue gabapentin as noncompliant and with hx AMS and ESRD.  For now given that she is starting HD tomorrow AM will go ahead and d/c lasix and bicarbonate and manage volume and acidosis with HD.   Intake/Output Summary (Last 24 hours) at 10/09/2018 1755 Last data filed at 10/09/2018 1300 Gross per 24 hour  Intake 360 ml  Output 1400 ml  Net -1040 ml    Vitals:  Vitals:   10/08/18 2234 10/09/18 0523 10/09/18 0844 10/09/18 1326  BP: (!) 158/53 (!) 142/63 (!) 144/63 120/79  Pulse: 65 (!) 59 61 61  Resp:  18  20  Temp:  97.8 F (36.6 C)  97.9 F (36.6 C)  TempSrc:  Oral  Oral  SpO2:  98%  99%  Weight:  98.6 kg    Height:         Physical Exam:  General adult female in bed in no acute distress HEENT normocephalic atraumatic extraocular movements intact sclera anicteric Neck supple trachea midline Lungs clear and unlabored  Heart regular rate and rhythm no rubs or gallops appreciated Abdomen soft nontender  nondistended Extremities bilateral lower extremities with trace edema Psych normal mood and affect Right AVF with bruit and thrill    Medications reviewed   Labs:  BMP Latest Ref Rng & Units 10/09/2018 10/08/2018 10/08/2018  Glucose 70 - 99 mg/dL 284(H) 304(H) -  BUN 6 - 20 mg/dL 80(H) 75(H) -  Creatinine 0.44 - 1.00 mg/dL 7.63(H) 7.33(H) -  BUN/Creat Ratio 9 - 23 - - -  Sodium 135 - 145 mmol/L 130(L) 130(L) -  Potassium 3.5 - 5.1 mmol/L 4.0 3.9 -  Chloride 98 - 111 mmol/L 104 102 -  CO2 22 - 32 mmol/L 14(L) 15(L) -  Calcium 8.9 - 10.3 mg/dL 8.2(L) 8.2(L) 8.3(L)     Assessment/Plan:   1.  CKD stage 5 with mild symptoms of uremia.  - HD tomorrow first treatment  - I have spoken with HD unit secretary to start CLIP process to find an outpatient unit.  They are coordinating with SW on her floor given her social situation and hx of homelessness.  Awaiting dispo.   2. Hypertension - acceptable  3. Metabolic acidosis - for HD.  Oral bicarbonate this evening 4. Anemia  - on oral iron and for ESA once HD initiated   5. DVT R Femoral Vein- Per primary. 6. DM-per primary.  7. Psychosis-cont psych meds per primary.  8. Medical non-compliance and hx homelessness -  see that SW is consulted and appreciate their assistance    Claudia Desanctis, MD 10/09/2018 5:55 PM

## 2018-10-09 NOTE — Procedures (Signed)
Interventional Radiology Procedure:   Indications: Starting dialysis and pulling clots from right arm AV fistula  Procedure: Right upper arm fistulogram  Findings: Fistula is widely patent.  Complications: None     EBL: None  Plan: AV fistula is OK to use.     Celest Reitz R. Anselm Pancoast, MD  Pager: [phone]

## 2018-10-10 LAB — GLUCOSE, CAPILLARY
Glucose-Capillary: 157 mg/dL — ABNORMAL HIGH (ref 70–99)
Glucose-Capillary: 149 mg/dL — ABNORMAL HIGH (ref 70–99)
Glucose-Capillary: 269 mg/dL — ABNORMAL HIGH (ref 70–99)
Glucose-Capillary: 231 mg/dL — ABNORMAL HIGH (ref 70–99)
Glucose-Capillary: 199 mg/dL — ABNORMAL HIGH (ref 70–99)

## 2018-10-10 LAB — BASIC METABOLIC PANEL
Anion gap: 10 (ref 5–15)
BUN: 81 mg/dL — ABNORMAL HIGH (ref 6–20)
CHLORIDE: 107 mmol/L (ref 98–111)
CO2: 18 mmol/L — ABNORMAL LOW (ref 22–32)
Calcium: 8.7 mg/dL — ABNORMAL LOW (ref 8.9–10.3)
Creatinine, Ser: 7.27 mg/dL — ABNORMAL HIGH (ref 0.44–1.00)
GFR calc Af Amer: 7 mL/min — ABNORMAL LOW (ref >60)
GFR calc non Af Amer: 6 mL/min — ABNORMAL LOW (ref >60)
Glucose, Bld: 191 mg/dL — ABNORMAL HIGH (ref 70–99)
Potassium: 4.2 mmol/L (ref 3.5–5.1)
Sodium: 135 mmol/L (ref 135–145)

## 2018-10-10 LAB — CBC
HCT: 28.6 % — ABNORMAL LOW (ref 36.0–46.0)
HEMOGLOBIN: 9.2 g/dL — AB (ref 12.0–15.0)
MCH: 25.8 pg — ABNORMAL LOW (ref 26.0–34.0)
MCHC: 32.2 g/dL (ref 30.0–36.0)
MCV: 80.1 fL (ref 80.0–100.0)
Platelets: 288 10*3/uL (ref 150–400)
RBC: 3.57 MIL/uL — AB (ref 3.87–5.11)
RDW: 14.7 % (ref 11.5–15.5)
WBC: 7.7 10*3/uL (ref 4.0–10.5)
nRBC: 0 % (ref 0.0–0.2)

## 2018-10-10 LAB — APTT: aPTT: 108 seconds — ABNORMAL HIGH (ref 24–36)

## 2018-10-10 LAB — HEPARIN LEVEL (UNFRACTIONATED): Heparin Unfractionated: 1.24 IU/mL — ABNORMAL HIGH (ref 0.30–0.70)

## 2018-10-10 MED ORDER — DARBEPOETIN ALFA 60 MCG/0.3ML IJ SOSY
60.0000 ug | PREFILLED_SYRINGE | INTRAMUSCULAR | Status: DC
  Filled 2018-10-10: qty 0.3

## 2018-10-10 MED ORDER — APIXABAN 5 MG PO TABS: 10.0000 mg | ORAL_TABLET | Freq: Two times a day (BID) | ORAL | Status: DC

## 2018-10-10 MED ORDER — APIXABAN 5 MG PO TABS
10.0000 mg | ORAL_TABLET | Freq: Two times a day (BID) | ORAL | Status: DC
  Administered 2018-10-10 – 2018-10-16 (×11): 10 mg via ORAL
  Filled 2018-10-10 (×12): qty 2

## 2018-10-10 MED ORDER — APIXABAN 5 MG PO TABS: 5.0000 mg | ORAL_TABLET | Freq: Two times a day (BID) | ORAL | Status: DC

## 2018-10-10 MED ORDER — SODIUM CHLORIDE 0.9 % IV SOLN
125.0000 mg | INTRAVENOUS | Status: DC
  Administered 2018-10-15: 125 mg via INTRAVENOUS
  Filled 2018-10-10 (×2): qty 10

## 2018-10-10 MED ORDER — CHLORHEXIDINE GLUCONATE CLOTH 2 % EX PADS
6.0000 | MEDICATED_PAD | Freq: Every day | CUTANEOUS | Status: DC
  Administered 2018-10-14: 6 via TOPICAL

## 2018-10-10 NOTE — Progress Notes (Signed)
ANTICOAGULATION CONSULT NOTE  Pharmacy Consult:  Heparin Indication: DVT  Allergies  Allergen Reactions  . Penicillins Anaphylaxis and Hives    Has patient had a PCN reaction causing immediate rash, facial/tongue/throat swelling, SOB or lightheadedness with hypotension: yes Has patient had a PCN reaction causing severe rash involving mucus membranes or skin n24} If all of the above answers are "NO", then may proceed with Cephalosporin use.ecrosis: no Has patient had a PCN reaction that required hospitalization: no Has patient had a PCN reaction occurring within the last 10 years:no  . Tetracyclines & Related Anaphylaxis and Hives  . Morphine And Related Hives  . Amoxicillin     Same as penicillin  . Latex Itching and Rash  . Tape Itching and Rash    Patient Measurements: Height: 5\' 3"  (160 cm) Weight: 218 lb 8 oz (99.1 kg)(scale a) IBW/kg (Calculated) : 52.4 Heparin Dosing Weight: 75 kg  Vital Signs: BP: 124/64 (01/01 0506) Pulse Rate: 60 (01/01 0506)  Labs: Recent Labs    10/08/18 0443 10/09/18 0710 10/09/18 1934 10/10/18 0515  HGB 8.3* 8.3*  --  9.2*  HCT 25.4* 26.0*  --  28.6*  PLT 239 221  --  288  APTT  --  59* 64* 108*  HEPARINUNFRC  --  1.68* 1.40* 1.24*  CREATININE 7.33* 7.63*  --  7.27*    Estimated Creatinine Clearance: 9.7 mL/min (A) (by C-G formula based on SCr of 7.27 mg/dL (H)).    Assessment: Amber Lara presented with CP and pain in RLE, found to have RLE DVT. Started on apixaban treatment dose - last dose on 12/30 at 1003.  Plan to undergo fistulogram and possible thrombectomy by IR, so was changed to IV heparin.    Given recent apixaban dosing, will use aPTT to guide heparin dosing.  APTT is slightly supra-therapeutic (108 secs) and heparin level is elevated (1.24 units/mL) as expected.  No bleeding reported.   Goal of Therapy:  APTT 66 - 102 secs Heparin level 0.3-0.7 units/ml Monitor platelets by anticoagulation protocol: Yes    Plan:   Decrease heparin infusion slightly to 1200 units/hr  Check 8 hr heparin level and aPTT  Vertis Kelch, PharmD PGY1 Pharmacy Resident Phone [phone] 10/10/2018       8:12 AM

## 2018-10-10 NOTE — Progress Notes (Signed)
Patient off floor to dialysis

## 2018-10-10 NOTE — Progress Notes (Signed)
Pt has been asking for multiple cups of water and ice. Pt has already had 578ml of fluid since midnight. Pt educated on diet and fluid overload. Pt needs reinforcement education.

## 2018-10-10 NOTE — Progress Notes (Signed)
PROGRESS NOTE  Amber Lara WRU:[id number] DOB: 27-Sep-1961 DOA: 10/05/2018 PCP: Patient, [facility]  Brief History   58 year old woman PMH CKD stage V, psychosis presented with chest pain and right lower extremity pain.  Found to have acute right lower extremity DVT, worsening renal function.  CKD progressed to ESRD and hemodialysis was initiated and CLIP is being pursued.  Will be stable for discharge when has outpatient hemodialysis arranged.  Assessment/Plan Acute right femoral DVT.  VQ scan was normal.  No obvious risk factors identified, no recent travel.  Initially treated with heparin and then transition to apixaban, however transitioned back to heparin in preparation for procedure. --Resume apixaban as per previous documentation by Dr. [name] and pharmacy.  Felt to be best agent given patient's history of noncompliance.  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  Failed to improve significantly with Lasix, seen by nephrology, plan for dialysis.  Fistula noted to be patent per IR 12/31 --Continue management per nephrology  Essential hypertension --Remains stable.  Continue amlodipine and metoprolol  Diabetes mellitus type 2, uncontrolled, hemoglobin A1c 11.8 in August, noncompliant with Lantus as an outpatient, noncompliant with diet --CBG stable.  Continue Lantus, meal coverage, sliding scale insulin.  Dietitian consult --Discontinue gabapentin permanently given noncompliance and ESRD as per nephrology  Anemia of CKD --Stable.  Follow periodically.  PMH psychosis, paranoia and delusional thoughts, noncompliant with Cogentin --Mentation appears stable.  Continue Cogentin.  Seizure disorder --Continue Keppra  Chest pain, shortness of breath, resolved.  No PE on VQ scan.  EKG nonacute.  Troponins normal. --No further evaluation planned  Left flank pain, CT renal study nonspecific, suggestion of ureteritis,  however patient not symptomatic, no role for antibiotics per Dr. [name]   Next HD 1/2.  CLIP initiated.  Resume apixaban.  Should be stable for discharge once cleared by nephrology.  DVT prophylaxis: apixaban Code Status: Full code Family Communication: discussed with patient Disposition Plan: SNF per PT   [name], MD  [facility] Direct contact: [phone] --Via amion app OR  --[email or web address]; password [email or web address]  7PM-7AM contact night coverage as above 10/10/2018, 4:18 PM  LOS: 1 day   Consultants:  Nephrology  Procedures:  RIGHT UPPER EXTREMITY FISTULOGRAM, ULTRASOUND GUIDANCE FOR VASCULAR ACCESS  Antimicrobials:    Interval history/Subjective: Follow-up acute DVT, AKI  Feels better today.  Eating well.  No complaints.  No leg pain.  Objective: Vitals:  Vitals:   10/10/18 0930 10/10/18 0954  BP: (!) 138/59 (!) 148/64  Pulse: 60 64  Resp:  18  Temp:  97.9 F (36.6 C)  SpO2:  100%    Exam:  Constitutional:   . Appears calm and comfortable Respiratory:  . CTA bilaterally, no w/r/r.  . Respiratory effort normal. Cardiovascular:  . RRR, no m/r/g . No significant LE extremity edema   Psychiatric:  . Mental status o Mood, affect appropriate   I have personally reviewed the following:   Data: . Urine output 1320 . Fasting CBG 157.  Got up to 383 yesterday . Potassium within normal limits . Hemoglobin stable at 9.2, platelets and WBC within normal limits. . Chest x-ray on admission no acute disease .   Scheduled Meds: . amLODipine  10 mg Oral Daily  . atorvastatin  40 mg Oral q1800  . benztropine  2 mg Oral BID  . Chlorhexidine Gluconate Cloth  6 each Topical Q0600  . Chlorhexidine Gluconate Cloth  6 each Topical Q0600  . [  START ON 10/11/2018] Chlorhexidine Gluconate Cloth  6 each Topical Q0600  . darbepoetin (ARANESP) injection - DIALYSIS  60 mcg Intravenous Q14 Days  . insulin aspart  0-9 Units Subcutaneous TID WC  . insulin aspart   3 Units Subcutaneous TID WC  . insulin glargine  30 Units Subcutaneous QHS  . levETIRAcetam  500 mg Oral BID  . metoprolol tartrate  50 mg Oral BID   Continuous Infusions: . [START ON 10/12/2018] ferric gluconate (FERRLECIT/NULECIT) IV    . heparin 1,200 Units/hr (10/10/18 1027)    Principal Problem:   Acute renal failure (ARF) (HCC) Active Problems:   Type 2 diabetes mellitus with hyperglycemia (HCC)   Normochromic normocytic anemia   Seizure (HCC)   CKD (chronic kidney disease), stage V (HCC)   Acute deep vein thrombosis (DVT) of femoral vein of right lower extremity ([place])   ESRD (end stage renal disease) ([place])   LOS: 1 day

## 2018-10-10 NOTE — Plan of Care (Signed)
  Problem: Education: Goal: Ability to demonstrate management of disease process will improve Outcome: Progressing Goal: Ability to verbalize understanding of medication therapies will improve Outcome: Progressing   Problem: Activity: Goal: Capacity to carry out activities will improve Outcome: Progressing   Problem: Cardiac: Goal: Ability to achieve and maintain adequate cardiopulmonary perfusion will improve Outcome: Progressing   Problem: Education: Goal: Knowledge of General Education information will improve Description Including pain rating scale, medication(s)/side effects and non-pharmacologic comfort measures Outcome: Progressing   Problem: Health Behavior/Discharge Planning: Goal: Ability to manage health-related needs will improve Outcome: Progressing   Problem: Clinical Measurements: Goal: Ability to maintain clinical measurements within normal limits will improve Outcome: Progressing Goal: Will remain free from infection Outcome: Progressing Goal: Diagnostic test results will improve Outcome: Progressing Goal: Respiratory complications will improve Outcome: Progressing Goal: Cardiovascular complication will be avoided Outcome: Progressing   Problem: Activity: Goal: Risk for activity intolerance will decrease Outcome: Progressing   Problem: Nutrition: Goal: Adequate nutrition will be maintained Outcome: Progressing   Problem: Coping: Goal: Level of anxiety will decrease Outcome: Progressing   Problem: Elimination: Goal: Will not experience complications related to bowel motility Outcome: Progressing Goal: Will not experience complications related to urinary retention Outcome: Progressing   Problem: Pain Managment: Goal: General experience of comfort will improve Outcome: Progressing   Problem: Safety: Goal: Ability to remain free from injury will improve Outcome: Progressing   Problem: Skin Integrity: Goal: Risk for impaired skin integrity  will decrease Outcome: Progressing   Problem: Education: Goal: Ability to describe self-care measures that may prevent or decrease complications (Diabetes Survival Skills Education) will improve Outcome: Progressing   Problem: Coping: Goal: Ability to adjust to condition or change in health will improve Outcome: Progressing   Problem: Fluid Volume: Goal: Ability to maintain a balanced intake and output will improve Outcome: Progressing   Problem: Health Behavior/Discharge Planning: Goal: Ability to identify and utilize available resources and services will improve Outcome: Progressing Goal: Ability to manage health-related needs will improve Outcome: Progressing   Problem: Metabolic: Goal: Ability to maintain appropriate glucose levels will improve Outcome: Progressing   Problem: Nutritional: Goal: Maintenance of adequate nutrition will improve Outcome: Progressing Goal: Progress toward achieving an optimal weight will improve Outcome: Progressing   Problem: Education: Goal: Knowledge of disease and its progression will improve Outcome: Progressing Goal: Individualized Educational Video(s) Outcome: Progressing   Problem: Fluid Volume: Goal: Compliance with measures to maintain balanced fluid volume will improve Outcome: Progressing   Problem: Health Behavior/Discharge Planning: Goal: Ability to manage health-related needs will improve Outcome: Progressing   Problem: Nutritional: Goal: Ability to make healthy dietary choices will improve Outcome: Progressing   Problem: Clinical Measurements: Goal: Complications related to the disease process, condition or treatment will be avoided or minimized Outcome: Progressing

## 2018-10-10 NOTE — Progress Notes (Addendum)
[facility] Progress Note  Name: Amber Lara MRN: [id number] DOB: 1960/12/15   Subjective:  HD this AM - tol ok.  Per RN who cannulated her AVF was ok. She has no complaints.    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  She has been lost to follow-up.  She has had mild uremic symptoms, CKD progression and is now with metabolic acidosis.  She does agree to go ahead and start dialysis tomorrow.  Discussed the risks and benefits and indications.  She has a history of being homeless and states her current living arrangement is unsafe. Will need to coordinate an outpatient unit for the patient keeping her social situation in mind.  Would discontinue gabapentin as noncompliant and with hx AMS and ESRD.  For now given that she is starting HD tomorrow AM will go ahead and d/c lasix and bicarbonate and manage volume and acidosis with HD.   Intake/Output Summary (Last 24 hours) at 10/10/2018 1214 Last data filed at 10/10/2018 0954 Gross per 24 hour  Intake 960 ml  Output 1321 ml  Net -361 ml    Vitals:  Vitals:   10/10/18 0830 10/10/18 0900 10/10/18 0930 10/10/18 0954  BP: (!) 165/114 127/82 (!) 138/59 (!) 148/64  Pulse: (!) 58 60 60 64  Resp:    18  Temp:    97.9 F (36.6 C)  TempSrc:    Oral  SpO2:    100%  Weight:    99.3 kg  Height:         Physical Exam:  General adult female in bed in no acute distress HEENT normocephalic atraumatic extraocular movements intact sclera anicteric Neck supple trachea midline Lungs clear and unlabored  Heart regular rate and rhythm no rubs or gallops appreciated Abdomen soft nontender nondistended Extremities bilateral lower extremities with trace edema Psych normal mood and affect Right AVF with bruit and thrill     Medications reviewed   Labs:  BMP Latest Ref Rng & Units 10/10/2018 10/09/2018 10/08/2018  Glucose 70 - 99 mg/dL 191(H) 284(H) 304(H)  BUN 6 - 20 mg/dL 81(H) 80(H) 75(H)  Creatinine 0.44 - 1.00 mg/dL 7.27(H) 7.63(H) 7.33(H)  BUN/Creat Ratio 9 - 23 - - -  Sodium 135 - 145 mmol/L 135 130(L) 130(L)  Potassium 3.5 - 5.1 mmol/L 4.2 4.0 3.9  Chloride 98 - 111 mmol/L 107 104 102  CO2 22 - 32 mmol/L 18(L) 14(L) 15(L)  Calcium 8.9 - 10.3 mg/dL 8.7(L) 8.2(L) 8.2(L)     Assessment/Plan:   1.  CKD stage 5 with mild symptoms of uremia.  - HD today first treatment, 2nd tomorrow then anticipate next on 1/4 - Started CLIP process to find an outpatient unit.  They are coordinating with SW on her floor given her social situation and hx of homelessness.  Awaiting dispo.   2. Hypertension - acceptable  3. Metabolic acidosis - for HD.  Oral bicarbonate, will d/c as able once HD established. 4. Anemia  - Hb 9.2 1/1.  on oral iron - sat 15% so will give IV iron with HD and d/c po iron.  Start darbo tomorrow; dose 60 every 2 weeks. 5. DVT R Femoral Vein- Per primary. 6. DM-per primary.  7. Psychosis-cont psych meds per primary.  8. Medical non-compliance and hx homelessness -  see that SW is consulted and appreciate their assistance    Justin Mend, MD 10/10/2018 12:14 PM

## 2018-10-10 NOTE — Progress Notes (Signed)
ANTICOAGULATION CONSULT NOTE - Initial Consult  Pharmacy Consult for apixaban Indication: DVT  Allergies  Allergen Reactions  . Penicillins Anaphylaxis and Hives    Has patient had a PCN reaction causing immediate rash, facial/tongue/throat swelling, SOB or lightheadedness with hypotension: yes Has patient had a PCN reaction causing severe rash involving mucus membranes or skin n24} If all of the above answers are "NO", then may proceed with Cephalosporin use.ecrosis: no Has patient had a PCN reaction that required hospitalization: no Has patient had a PCN reaction occurring within the last 10 years:no  . Tetracyclines & Related Anaphylaxis and Hives  . Morphine And Related Hives  . Amoxicillin     Same as penicillin  . Latex Itching and Rash  . Tape Itching and Rash    Patient Measurements: Height: 5\' 3"  (160 cm) Weight: 218 lb 14.7 oz (99.3 kg) IBW/kg (Calculated) : 52.4  Vital Signs: Temp: 97.9 F (36.6 C) (01/01 0954) Temp Source: Oral (01/01 0954) BP: 148/64 (01/01 0954) Pulse Rate: 64 (01/01 0954)  Labs: Recent Labs    10/08/18 0443 10/09/18 0710 10/09/18 1934 10/10/18 0515  HGB 8.3* 8.3*  --  9.2*  HCT 25.4* 26.0*  --  28.6*  PLT 239 221  --  288  APTT  --  59* 64* 108*  HEPARINUNFRC  --  1.68* 1.40* 1.24*  CREATININE 7.33* 7.63*  --  7.27*    Estimated Creatinine Clearance: 9.7 mL/min (A) (by C-G formula based on SCr of 7.27 mg/dL (H)).   Medical History: Past Medical History:  Diagnosis Date  . Arthritis   . Chronic anemia   . CKD (chronic kidney disease), stage V ([place])   . Diabetes mellitus ([place])   . History of stroke   . Hypertension   . Obesity   . Psychosis ([place])    a. prior adm in [place] ([place]) in 2015 where pt was admitted with hallucinations, seeing men in black outfits, family members on a non-existent camera, hearing voices and seeing animals - UDS positive for methamphetamines at that time and patient stated she  believed someone had put this in her body.  . Seizures ([place])     Assessment: 71 YOF presented with CP and pain in RLE, found to have RLE DVT. Started on apixaban treatment dose - last dose on 12/30 at 1003. Apixaban was transitioned to heparin with possible procedure. Pharmacy has been consulted to transition back to apixaban for DVT treatment.  Patient received 4 doses of apixaban prior to transitioning to heparin with 2 subtherapeutic levels and the most recent level being slightly supratherapeutic. To fill any possible gap in systemic anticoagulation, will restart apixaban treatment regimen.    Plan:  Apixaban 10 mg BID x 7 days, followed by 5 mg BID Stop heparin infusion when apixaban dose is given Monitor for signs and symptoms of bleeding  Vertis Kelch, PharmD PGY1 Pharmacy Resident Phone [phone] 10/10/2018       4:28 PM

## 2018-10-11 LAB — RENAL FUNCTION PANEL
Albumin: 2.3 g/dL — ABNORMAL LOW (ref 3.5–5.0)
Anion gap: 10 (ref 5–15)
BUN: 42 mg/dL — ABNORMAL HIGH (ref 6–20)
CHLORIDE: 105 mmol/L (ref 98–111)
CO2: 21 mmol/L — ABNORMAL LOW (ref 22–32)
Calcium: 8.3 mg/dL — ABNORMAL LOW (ref 8.9–10.3)
Creatinine, Ser: 4.86 mg/dL — ABNORMAL HIGH (ref 0.44–1.00)
GFR calc Af Amer: 11 mL/min — ABNORMAL LOW (ref >60)
GFR calc non Af Amer: 9 mL/min — ABNORMAL LOW (ref >60)
Glucose, Bld: 92 mg/dL (ref 70–99)
POTASSIUM: 3.7 mmol/L (ref 3.5–5.1)
Phosphorus: 3.5 mg/dL (ref 2.5–4.6)
Sodium: 136 mmol/L (ref 135–145)

## 2018-10-11 LAB — CBC
HCT: 26.4 % — ABNORMAL LOW (ref 36.0–46.0)
Hemoglobin: 8.6 g/dL — ABNORMAL LOW (ref 12.0–15.0)
MCH: 26.4 pg (ref 26.0–34.0)
MCHC: 32.6 g/dL (ref 30.0–36.0)
MCV: 81 fL (ref 80.0–100.0)
Platelets: 250 10*3/uL (ref 150–400)
RBC: 3.26 MIL/uL — ABNORMAL LOW (ref 3.87–5.11)
RDW: 14.7 % (ref 11.5–15.5)
WBC: 6.1 10*3/uL (ref 4.0–10.5)
nRBC: 0 % (ref 0.0–0.2)

## 2018-10-11 LAB — GLUCOSE, CAPILLARY
Glucose-Capillary: 86 mg/dL (ref 70–99)
Glucose-Capillary: 296 mg/dL — ABNORMAL HIGH (ref 70–99)
Glucose-Capillary: 281 mg/dL — ABNORMAL HIGH (ref 70–99)

## 2018-10-11 MED ORDER — CHLORHEXIDINE GLUCONATE CLOTH 2 % EX PADS: 6.0000 | MEDICATED_PAD | Freq: Every day | CUTANEOUS | Status: DC

## 2018-10-11 MED ORDER — LIDOCAINE-PRILOCAINE 2.5-2.5 % EX CREA: 1.0000 "application " | TOPICAL_CREAM | CUTANEOUS | Status: DC | PRN

## 2018-10-11 MED ORDER — PENTAFLUOROPROP-TETRAFLUOROETH EX AERO: 1.0000 "application " | INHALATION_SPRAY | CUTANEOUS | Status: DC | PRN

## 2018-10-11 MED ORDER — DARBEPOETIN ALFA 60 MCG/0.3ML IJ SOSY
PREFILLED_SYRINGE | INTRAMUSCULAR | Status: AC
  Administered 2018-10-11: 60 ug
  Filled 2018-10-11: qty 0.3

## 2018-10-11 MED ORDER — HEPARIN SODIUM (PORCINE) 1000 UNIT/ML DIALYSIS: 1000.0000 [IU] | INTRAMUSCULAR | Status: DC | PRN

## 2018-10-11 MED ORDER — LEVETIRACETAM 500 MG PO TABS
500.0000 mg | ORAL_TABLET | Freq: Two times a day (BID) | ORAL | Status: DC
  Administered 2018-10-11 – 2018-10-16 (×11): 500 mg via ORAL
  Filled 2018-10-11 (×12): qty 1

## 2018-10-11 MED ORDER — ALTEPLASE 2 MG IJ SOLR: 2.0000 mg | Freq: Once | INTRAMUSCULAR | Status: DC | PRN

## 2018-10-11 MED ORDER — SODIUM CHLORIDE 0.9 % IV SOLN: 100.0000 mL | INTRAVENOUS | Status: DC | PRN

## 2018-10-11 MED ORDER — LIDOCAINE HCL (PF) 1 % IJ SOLN: 5.0000 mL | INTRAMUSCULAR | Status: DC | PRN

## 2018-10-11 NOTE — Progress Notes (Signed)
[facility] Progress Note  Name: Amber Lara MRN: [id number] DOB: 04/21/1961   Subjective:  HD again today.  Clinically feeling ok.   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  She has been lost to follow-up.  She has had mild uremic symptoms, CKD progression and is now with metabolic acidosis.  She does agree to go ahead and start dialysis tomorrow.  Discussed the risks and benefits and indications.  She has a history of being homeless and states her current living arrangement is unsafe. Will need to coordinate an outpatient unit for the patient keeping her social situation in mind.  Would discontinue gabapentin as noncompliant and with hx AMS and ESRD.  For now given that she is starting HD tomorrow AM will go ahead and d/c lasix and bicarbonate and manage volume and acidosis with HD.   Intake/Output Summary (Last 24 hours) at 10/11/2018 1531 Last data filed at 10/11/2018 1458 Gross per 24 hour  Intake 240 ml  Output 2000 ml  Net -1760 ml    Vitals:  Vitals:   10/11/18 1200 10/11/18 1230 10/11/18 1300 10/11/18 1319  BP: (!) 121/38 (!) 171/74 (!) 154/65 (!) 149/71  Pulse: 61 70 72 67  Resp: 19 17  18   Temp:    97.9 F (36.6 C)  TempSrc:    Oral  SpO2:    100%  Weight:    98.7 kg  Height:         Physical Exam:  General adult female in bed in no acute distress HEENT normocephalic atraumatic extraocular movements intact sclera anicteric Neck supple trachea midline Lungs clear and unlabored  Heart regular rate and rhythm no rubs or gallops appreciated Abdomen soft nontender nondistended Extremities bilateral lower extremities with trace edema Psych normal mood and affect Right AVF with bruit and thrill    Medications reviewed   Labs:  BMP  Latest Ref Rng & Units 10/11/2018 10/10/2018 10/09/2018  Glucose 70 - 99 mg/dL 92 191(H) 284(H)  BUN 6 - 20 mg/dL 42(H) 81(H) 80(H)  Creatinine 0.44 - 1.00 mg/dL 4.86(H) 7.27(H) 7.63(H)  BUN/Creat Ratio 9 - 23 - - -  Sodium 135 - 145 mmol/L 136 135 130(L)  Potassium 3.5 - 5.1 mmol/L 3.7 4.2 4.0  Chloride 98 - 111 mmol/L 105 107 104  CO2 22 - 32 mmol/L 21(L) 18(L) 14(L)  Calcium 8.9 - 10.3 mg/dL 8.3(L) 8.7(L) 8.2(L)     Assessment/Plan:   1.  CKD stage 5 with mild symptoms of uremia.  - treatment #3 tomorrow then will adapt to outpatient schedule when it's known - Started CLIP process to find an outpatient unit.  They are coordinating with SW on her floor given her social situation and hx of homelessness.  Awaiting dispo.   2. Hypertension - acceptable  3. Metabolic acidosis - for HD.  Oral bicarbonate, will d/c as able once HD established. 4. Anemia  - Hb 8.6 1/2.  on oral iron - sat 15% so will giving IV iron with HD.  Started darbo today; dose 60 every 2 weeks. 5. DVT R Femoral Vein- Per primary. 6. DM-per primary.  7. Psychosis-cont psych meds per primary.  8. Medical non-compliance and hx homelessness - see that SW is consulted and appreciate their assistance    Justin Mend, MD  10/11/2018 3:31 PM

## 2018-10-11 NOTE — Discharge Instructions (Signed)
Information on my medicine - ELIQUIS (apixaban)  This medication education was reviewed with me or my healthcare representative as part of my discharge preparation.    Why was Eliquis prescribed for you? Eliquis was prescribed to treat blood clots that may have been found in the veins of your legs (deep vein thrombosis) or in your lungs (pulmonary embolism) and to reduce the risk of them occurring again.  What do You need to know about Eliquis ? The starting dose is 10 mg (two 5 mg tablets) taken TWICE daily for the FIRST SEVEN (7) DAYS, then on 10/18/2018  the dose is reduced to ONE 5 mg tablet taken TWICE daily.  Eliquis may be taken with or without food.   Try to take the dose about the same time in the morning and in the evening. If you have difficulty swallowing the tablet whole please discuss with your pharmacist how to take the medication safely.  Take Eliquis exactly as prescribed and DO NOT stop taking Eliquis without talking to the doctor who prescribed the medication.  Stopping may increase your risk of developing a new blood clot.  Refill your prescription before you run out.  After discharge, you should have regular check-up appointments with your healthcare provider that is prescribing your Eliquis.    What do you do if you miss a dose? If a dose of ELIQUIS is not taken at the scheduled time, take it as soon as possible on the same day and twice-daily administration should be resumed. The dose should not be doubled to make up for a missed dose.  Important Safety Information A possible side effect of Eliquis is bleeding. You should call your healthcare provider right away if you experience any of the following: ? Bleeding from an injury or your nose that does not stop. ? Unusual colored urine (red or dark brown) or unusual colored stools (red or black). ? Unusual bruising for unknown reasons. ? A serious fall or if you hit your head (even if there is no bleeding).  Some  medicines may interact with Eliquis and might increase your risk of bleeding or clotting while on Eliquis. To help avoid this, consult your healthcare provider or pharmacist prior to using any new prescription or non-prescription medications, including herbals, vitamins, non-steroidal anti-inflammatory drugs (NSAIDs) and supplements.  This website has more information on Eliquis (apixaban): http://www.eliquis.com/eliquis/home

## 2018-10-11 NOTE — Plan of Care (Signed)
?  Problem: Education: ?Goal: Ability to demonstrate management of disease process will improve ?Outcome: Progressing ?Goal: Ability to verbalize understanding of medication therapies will improve ?Outcome: Progressing ?  ?Problem: Activity: ?Goal: Capacity to carry out activities will improve ?Outcome: Progressing ?  ?Problem: Cardiac: ?Goal: Ability to achieve and maintain adequate cardiopulmonary perfusion will improve ?Outcome: Progressing ?  ?Problem: Education: ?Goal: Knowledge of General Education information will improve ?Description: Including pain rating scale, medication(s)/side effects and non-pharmacologic comfort measures ?Outcome: Progressing ?  ?

## 2018-10-11 NOTE — Plan of Care (Signed)
°  Problem: Education: °Goal: Ability to demonstrate management of disease process will improve °Outcome: Progressing °  °Problem: Activity: °Goal: Capacity to carry out activities will improve °Outcome: Progressing °  °Problem: Health Behavior/Discharge Planning: °Goal: Ability to manage health-related needs will improve °Outcome: Progressing °  °

## 2018-10-11 NOTE — Progress Notes (Signed)
PT Cancellation Note  Patient Details Name: Amber Lara MRN: [id number] DOB: [birth year]   Cancelled Treatment:    Reason Eval/Treat Not Completed: Patient at procedure or test/unavailable(Pt in HD.  Will return as able.  )   Denice Paradise 10/11/2018, 1:27 PM [place] Pager:  [phone]  Office:  [phone]

## 2018-10-11 NOTE — Progress Notes (Signed)
PROGRESS NOTE  Loney Peto YQM:[id number] DOB: July 20, 1961 DOA: 10/05/2018 PCP: Patient, [facility] P[facility]  Brief History   58 year old woman PMH CKD stage V, psychosis presented with chest pain and right lower extremity pain.  Found to have acute right lower extremity DVT, worsening renal function.  CKD progressed to ESRD and hemodialysis was initiated and CLIP is being pursued.  Will be stable for discharge when has outpatient hemodialysis arranged.  Assessment/Plan Acute right femoral DVT.  VQ scan was normal.  No obvious risk factors identified, no recent travel.  Initially treated with heparin and then transition to apixaban, however transitioned back to heparin in preparation for procedure. --Continue apixaban, felt to be the best choice given the patient's noncompliance.  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  Failed to improve significantly with Lasix, seen by nephrology, plan for dialysis.  Fistula noted to be patent per IR 12/31 --Continue management per nephrology, plan for HD today.  Essential hypertension --Stable.  Continue amlodipine and metoprolol. Diabetes mellitus type 2, uncontrolled, hemoglobin A1c 11.8 in August, noncompliant with Lantus as an outpatient, noncompliant with diet --Overall CBG stable.  Continue Lantus, meal coverage, sliding scale insulin. --Discontinue gabapentin permanently given noncompliance and ESRD as recommended by nephrology  Anemia of CKD --Remains stable.  Follow periodically.  PMH psychosis, paranoia and delusional thoughts, noncompliant with Cogentin --Mentation appears stable.  Continue Cogentin.  Seizure disorder --Continue Keppra  Chest pain, shortness of breath, resolved.  No PE on VQ scan.  EKG nonacute.  Troponins normal. --No further evaluation planned  Left flank pain, CT renal study nonspecific, suggestion of ureteritis, however patient not symptomatic, no role for  antibiotics per Dr. [name]   Hemodialysis today.  CLIP in process.  Should be stable for discharge once cleared by nephrology.  DVT prophylaxis: apixaban Code Status: Full code Family Communication:  Disposition Plan: SNF per PT   [name], MD  [facility] Direct contact: [phone] --Via amion app OR  --[email or web address]; password [email or web address]  7PM-7AM contact night coverage as above 10/11/2018, 9:57 AM  LOS: 2 days   Consultants:  Nephrology  Procedures:  RIGHT UPPER EXTREMITY FISTULOGRAM, ULTRASOUND GUIDANCE FOR VASCULAR ACCESS  Antimicrobials:    Interval history/Subjective: Follow-up acute DVT, CKD progressed to ESRD  Cannot find a phone charger, upset this morning.  Discussed with RN, doing well, no needs identified.  Objective: Vitals:  Vitals:   10/10/18 2100 10/11/18 0441  BP: (!) 156/76 (!) 150/60  Pulse: 62 60  Resp: 16 18  Temp: 98.2 F (36.8 C) 98.3 F (36.8 C)  SpO2: 100% 100%    Exam:  Constitutional:   . Appears calm, irritated, nontoxic Respiratory:  . CTA bilaterally, no w/r/r.  . Respiratory effort normal.  Cardiovascular:  . RRR, no m/r/g Psychiatric:  . Mental status o Mood, affect appropriate  I have personally reviewed the following:   Data: . CBG stable, fasting blood sugar 86. Marland Kitchen Hemoglobin stable 8.6.  WBC and platelets within normal limits.  Scheduled Meds: . amLODipine  10 mg Oral Daily  . apixaban  10 mg Oral BID   Followed by  . [START ON 10/18/2018] apixaban  5 mg Oral BID  . atorvastatin  40 mg Oral q1800  . benztropine  2 mg Oral BID  . Chlorhexidine Gluconate Cloth  6 each Topical Q0600  . Chlorhexidine Gluconate Cloth  6 each Topical Q0600  . Chlorhexidine Gluconate Cloth  6 each Topical Q0600  .  darbepoetin (ARANESP) injection - DIALYSIS  60 mcg Intravenous Q14 Days  . insulin aspart  0-9 Units Subcutaneous TID WC  . insulin aspart  3 Units Subcutaneous TID WC  . insulin glargine  30 Units  Subcutaneous QHS  . levETIRAcetam  500 mg Oral BID  . metoprolol tartrate  50 mg Oral BID   Continuous Infusions: . [START ON 10/12/2018] ferric gluconate (FERRLECIT/NULECIT) IV      Principal Problem:   Acute renal failure (ARF) (HCC) Active Problems:   Type 2 diabetes mellitus with hyperglycemia (HCC)   Normochromic normocytic anemia   Seizure (HCC)   CKD (chronic kidney disease), stage V (HCC)   Acute deep vein thrombosis (DVT) of femoral vein of right lower extremity (HCC)   ESRD (end stage renal disease) ([place])   LOS: 2 days

## 2018-10-11 NOTE — Clinical Social Work Note (Addendum)
Patient has five bed offers: [facility], [facility], [facility], [facility], and [facility]. She does not want [facility]. Patient wants to see which facilities have private rooms available before making her decision. Already notified her that [facility] private rooms and bathrooms for all short-term rehab residents. CSW will follow up with the other facilities.  Dayton Scrape, CSW [phone]  3:27 pm [facility] and [facility] do not have private rooms. [facility] does. Reviewed [facility] and [facility] CMS scores with patient. She has chosen [facility]. Notified [facility] on HD unit so she can work on clipping process.  Dayton Scrape, [place]

## 2018-10-12 LAB — CBC
HCT: 25 % — ABNORMAL LOW (ref 36.0–46.0)
HEMOGLOBIN: 8 g/dL — AB (ref 12.0–15.0)
MCH: 26.3 pg (ref 26.0–34.0)
MCHC: 32 g/dL (ref 30.0–36.0)
MCV: 82.2 fL (ref 80.0–100.0)
Platelets: 238 10*3/uL (ref 150–400)
RBC: 3.04 MIL/uL — ABNORMAL LOW (ref 3.87–5.11)
RDW: 14.8 % (ref 11.5–15.5)
WBC: 7.7 10*3/uL (ref 4.0–10.5)
nRBC: 0 % (ref 0.0–0.2)

## 2018-10-12 LAB — GLUCOSE, CAPILLARY
GLUCOSE-CAPILLARY: 143 mg/dL — AB (ref 70–99)
Glucose-Capillary: 153 mg/dL — ABNORMAL HIGH (ref 70–99)
Glucose-Capillary: 118 mg/dL — ABNORMAL HIGH (ref 70–99)
Glucose-Capillary: 176 mg/dL — ABNORMAL HIGH (ref 70–99)

## 2018-10-12 MED ORDER — LISINOPRIL 10 MG PO TABS
10.0000 mg | ORAL_TABLET | Freq: Every day | ORAL | Status: DC
  Administered 2018-10-13 – 2018-10-16 (×3): 10 mg via ORAL
  Filled 2018-10-12 (×3): qty 1

## 2018-10-12 MED ORDER — POLYVINYL ALCOHOL 1.4 % OP SOLN
1.0000 [drp] | OPHTHALMIC | Status: DC | PRN
  Administered 2018-10-12 – 2018-10-13 (×3): 1 [drp] via OPHTHALMIC
  Filled 2018-10-12: qty 15

## 2018-10-12 NOTE — Care Management Important Message (Signed)
Important Message  Patient Details  Name: Amber Lara MRN: [id number] Date of Birth: [birth year]   Medicare Important Message Given:  Yes    [name] P [place] 10/12/2018, 4:47 PM

## 2018-10-12 NOTE — Progress Notes (Signed)
PROGRESS NOTE  Amber Lara PJA:[id number] DOB: 02-Apr-1961 DOA: 10/05/2018 PCP: Patient, [facility]  Brief History   58 year old woman PMH CKD stage V, psychosis presented with chest pain and right lower extremity pain.  Found to have acute right lower extremity DVT, worsening renal function.  CKD progressed to ESRD and hemodialysis was initiated and CLIP is being pursued.  Will be stable for discharge when has outpatient hemodialysis arranged.  Assessment/Plan Acute right femoral DVT.  VQ scan was normal.  No obvious risk factors identified, no recent travel.  Initially treated with heparin and then transition to apixaban, however transitioned back to heparin in preparation for procedure. --Stable.  Continue apixaban.  Felt to be the best agent given the patient's noncompliance.   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  Failed to improve significantly with Lasix, seen by nephrology, plan for dialysis.  Fistula noted to be patent per IR 12/31 --Continue management per nephrology, plan for HD today.  Essential hypertension --Remains stable.  Continue amlodipine and metoprolol.  Lisinopril added.  Diabetes mellitus type 2, uncontrolled, hemoglobin A1c 11.8 in August, noncompliant with Lantus as an outpatient, noncompliant with diet --CBG labile.  Continue Lantus, meal coverage, sliding scale insulin. --Discontinue gabapentin permanently given noncompliance and ESRD as recommended by nephrology  Anemia of CKD --Stable.  Followed periodically.  PMH psychosis, paranoia and delusional thoughts, noncompliant with Cogentin --Mentation stable.  Continue Cogentin.  Seizure disorder --Stable.  Continue Keppra.  Chest pain, shortness of breath, resolved.  No PE on VQ scan.  EKG nonacute.  Troponins normal. --No further evaluation planned  Left flank pain, CT renal study nonspecific, suggestion of ureteritis, however patient not  symptomatic, no role for antibiotics per Dr. [name]  CLIP in process.  Should be able to discharge once cleared by nephrology.  DVT prophylaxis: apixaban Code Status: Full code Family Communication:  Disposition Plan: SNF per PT   [name], MD  [facility] Direct contact: [phone] --Via amion app OR  --[email or web address]; password [email or web address]  7PM-7AM contact night coverage as above 10/12/2018, 1:00 PM  LOS: 3 days   Consultants:  Nephrology  Procedures:  RIGHT UPPER EXTREMITY FISTULOGRAM, ULTRASOUND GUIDANCE FOR VASCULAR ACCESS  Antimicrobials:    Interval history/Subjective: Follow-up acute DVT, CKD progressed to ESRD  Feels fine today.  No complaints.  Objective: Vitals:  Vitals:   10/12/18 0413 10/12/18 1245  BP: (!) 153/64 (!) 154/70  Pulse: 67 64  Resp: 18 18  Temp: 98.8 F (37.1 C) 98.1 F (36.7 C)  SpO2: 100% 100%    Exam:  Constitutional:   . Appears calm and comfortable Respiratory:  . CTA bilaterally, no w/r/r.  . Respiratory effort normal.  Cardiovascular:  . RRR, no m/r/g . 1+ bilateral LE extremity edema   Psychiatric:  . Mental status o Mood, affect appropriate  I have personally reviewed the following:   Data: . CBG labile . Hemoglobin stable 8.0.  Platelets and WBC within normal limits.  Scheduled Meds: . amLODipine  10 mg Oral Daily  . apixaban  10 mg Oral BID   Followed by  . [START ON 10/18/2018] apixaban  5 mg Oral BID  . atorvastatin  40 mg Oral q1800  . benztropine  2 mg Oral BID  . Chlorhexidine Gluconate Cloth  6 each Topical Q0600  . Chlorhexidine Gluconate Cloth  6 each Topical Q0600  . Chlorhexidine Gluconate Cloth  6 each Topical Q0600  . darbepoetin (ARANESP)  injection - DIALYSIS  60 mcg Intravenous Q14 Days  . insulin aspart  0-9 Units Subcutaneous TID WC  . insulin aspart  3 Units Subcutaneous TID WC  . insulin glargine  30 Units Subcutaneous QHS  . levETIRAcetam  500 mg Oral BID  . [START ON  10/13/2018] lisinopril  10 mg Oral Daily  . metoprolol tartrate  50 mg Oral BID   Continuous Infusions: . ferric gluconate (FERRLECIT/NULECIT) IV      Principal Problem:   Acute renal failure (ARF) (HCC) Active Problems:   Type 2 diabetes mellitus with hyperglycemia (HCC)   Normochromic normocytic anemia   Seizure (HCC)   CKD (chronic kidney disease), stage V (HCC)   Acute deep vein thrombosis (DVT) of femoral vein of right lower extremity (HCC)   ESRD (end stage renal disease) ([place])   LOS: 3 days

## 2018-10-12 NOTE — Progress Notes (Signed)
[facility]    NEPHROLOGY PROGRESS NOTE  SUBJECTIVE:   No acute complaints today.  Continues to have LE edema.  No f/c, no cp, no n/v.  Is ambulating in the hallway.  No dysuria or weakness.  Denies HA and blurred vision.  All other ROS are negative.   OBJECTIVE:  Vitals:   10/11/18 2237 10/12/18 0413  BP: (!) 150/71 (!) 153/64  Pulse: 72 67  Resp: 18 18  Temp: 98.6 F (37 C) 98.8 F (37.1 C)  SpO2: 100% 100%   I/O last 3 completed shifts: In: 840 [P.O.:840] Out: 3700 [Urine:2700; Other:1000]   [place]:  AAOx3 NAD HEENT: MMM [place] AT anicteric sclera Neck:  No JVD, no adenopathy CV:  Heart RRR with 3/6 systolic murmur Lungs:  L/S CTA bilaterally Abd:  abd SNT/ND with normal BS GU:  Bladder non-palpable Extremities:  (+)2 bilateral LE edema. Skin:  No skin rash    LABS:  CBC Latest Ref Rng & Units 10/12/2018 10/11/2018 10/10/2018  WBC 4.0 - 10.5 K/uL 7.7 6.1 7.7  Hemoglobin 12.0 - 15.0 g/dL 8.0(L) 8.6(L) 9.2(L)  Hematocrit 36.0 - 46.0 % 25.0(L) 26.4(L) 28.6(L)  Platelets 150 - 400 K/uL 238 250 288    CMP Latest Ref Rng & Units 10/11/2018 10/10/2018 10/09/2018  Glucose 70 - 99 mg/dL 92 191(H) 284(H)  BUN 6 - 20 mg/dL 42(H) 81(H) 80(H)  Creatinine 0.44 - 1.00 mg/dL 4.86(H) 7.27(H) 7.63(H)  Sodium 135 - 145 mmol/L 136 135 130(L)  Potassium 3.5 - 5.1 mmol/L 3.7 4.2 4.0  Chloride 98 - 111 mmol/L 105 107 104  CO2 22 - 32 mmol/L 21(L) 18(L) 14(L)  Calcium 8.9 - 10.3 mg/dL 8.3(L) 8.7(L) 8.2(L)  Total Protein 6.5 - 8.1 g/dL - - -  Total Bilirubin 0.3 - 1.2 mg/dL - - -  Alkaline Phos 38 - 126 U/L - - -  AST 15 - 41 U/L - - -  ALT 0 - 44 U/L - - -    Lab Results  Component Value Date   PTH 68 (H) 10/08/2018   PTH Comment 10/08/2018   CALCIUM 8.3 (L) 10/11/2018   CAION 1.16 06/22/2017   PHOS 3.5 10/11/2018       Component Value Date/Time   COLORURINE STRAW (A) 10/06/2018 1330   APPEARANCEUR CLEAR 10/06/2018 1330   LABSPEC 1.010 10/06/2018 1330   PHURINE  5.0 10/06/2018 1330   GLUCOSEU >=500 (A) 10/06/2018 1330   HGBUR SMALL (A) 10/06/2018 1330   BILIRUBINUR NEGATIVE 10/06/2018 1330   KETONESUR NEGATIVE 10/06/2018 1330   PROTEINUR >=300 (A) 10/06/2018 1330   UROBILINOGEN 0.2 08/07/2017 0952   NITRITE NEGATIVE 10/06/2018 1330   LEUKOCYTESUR NEGATIVE 10/06/2018 1330      Component Value Date/Time   HCO3 20.0 08/03/2017 0845   TCO2 22 06/22/2017 2127   ACIDBASEDEF 6.0 (H) 08/03/2017 0845   O2SAT 42.6 08/03/2017 0845       Component Value Date/Time   IRON 28 10/08/2018 0443   IRON 30 07/19/2018 1210   TIBC 199 (L) 10/08/2018 0443   TIBC 201 (L) 07/19/2018 1210   FERRITIN 414 (H) 07/19/2018 1210   IRONPCTSAT 14 10/08/2018 0443   IRONPCTSAT 15 07/19/2018 1210       ASSESSMENT/PLAN:     Problem List Items Addressed This Visit      Cardiovascular and Mediastinum   Acute deep vein thrombosis (DVT) of femoral vein of right lower extremity (HCC) - Primary   Relevant Medications   heparin bolus  via infusion 4,500 Units (Completed)   amLODipine (NORVASC) tablet 10 mg   atorvastatin (LIPITOR) tablet 40 mg   metoprolol tartrate (LOPRESSOR) tablet 50 mg   furosemide (LASIX) injection 80 mg (Completed)   apixaban (ELIQUIS) tablet 10 mg   apixaban (ELIQUIS) tablet 5 mg (Start on 10/18/2018 10:00 AM)     Genitourinary   ESRD (end stage renal disease) ([place])     Other   RESOLVED: Chest pain    Other Visit Diagnoses    Hypertension, unspecified type       Relevant Medications   heparin bolus via infusion 4,500 Units (Completed)   amLODipine (NORVASC) tablet 10 mg   atorvastatin (LIPITOR) tablet 40 mg   metoprolol tartrate (LOPRESSOR) tablet 50 mg   furosemide (LASIX) injection 80 mg (Completed)   apixaban (ELIQUIS) tablet 10 mg   apixaban (ELIQUIS) tablet 5 mg (Start on 10/18/2018 10:00 AM)   Hyperglycemia       Dialysis complication       Relevant Orders   IR DIALY SHUNT INTRO NEEDLE/INTRACATH INITIAL W/IMG RIGHT (Completed)    IR US Guide Vasc Access Right (Completed)      1.  ESRD.  Plan HD MWF.  Awaiting OP placement.  No uremic symptoms 2.  HTN.  BP elevated.  Will add ACE. 3.  Acute DVT.  Continue eliquis 4.  Secondary hyperparathyroidism.  Phos and PTH at goal 5.  Dispo.  Awaiting placement in [place], [place], [place]

## 2018-10-12 NOTE — Progress Notes (Signed)
Physical Therapy Treatment Patient Details Name: Amber Lara MRN: [id number] DOB: May 07, 1961 Today's Date: 10/12/2018    History of Present Illness Patient is a 58 y/o female admitted 10/09/18 with hyperglycemia, leg pain and chest pain. Found to have acute DVT RLE. HD initiated this admission. PMH includes psychosis, seizures, HTN, stroke, CKD V.   PT Comments    Pt progressing with mobility. Ambulatory with SPC and close min guard due to balance deficits with minimal challenge. Pt not able to independently perform mobility or self-care tasks. Continue to recommend SNF-level therapies to maximize functional mobility and independence.    Follow Up Recommendations  SNF;Supervision for mobility/OOB     Equipment Recommendations  None recommended by PT    Recommendations for Other Services       Precautions / Restrictions Precautions Precautions: Fall Restrictions Weight Bearing Restrictions: No    Mobility  Bed Mobility Overal bed mobility: Modified Independent             General bed mobility comments: Received sitting EOB  Transfers Overall transfer level: Needs assistance Equipment used: Straight cane Transfers: Sit to/from Stand Sit to Stand: Min guard            Ambulation/Gait Ambulation/Gait assistance: Min guard Gait Distance (Feet): 200 Feet Assistive device: Straight cane Gait Pattern/deviations: Step-to pattern;Shuffle;Trunk flexed;Drifts right/left Gait velocity: Decreased Gait velocity interpretation: <1.8 ft/sec, indicate of risk for recurrent falls General Gait Details: Slow, unsteady ambulation with SPC and close min guard for balance. Slow, shuffling gait with intermittent drifting and tripping over feet, pt able to self-correct with min guard. 1x seated rest and 2x standing rest breaks secondary to fatigue   Stairs             Wheelchair Mobility    Modified Rankin (Stroke Patients Only)       Balance Overall balance  assessment: Needs assistance Sitting-balance support: Feet supported;No upper extremity supported Sitting balance-Leahy Scale: Good     Standing balance support: During functional activity Standing balance-Leahy Scale: Fair                              Cognition Arousal/Alertness: Awake/alert Behavior During Therapy: WFL for tasks assessed/performed Overall Cognitive Status: No family/caregiver present to determine baseline cognitive functioning Area of Impairment: Attention;Safety/judgement                   Current Attention Level: Selective     Safety/Judgement: Decreased awareness of deficits            Exercises      General Comments        Pertinent Vitals/Pain Pain Assessment: Faces Faces Pain Scale: Hurts little more Pain Location: RLE Pain Descriptors / Indicators: Sore;Discomfort Pain Intervention(s): Monitored during session;Limited activity within patient's tolerance    Home Living                      Prior Function            PT Goals (current goals can now be found in the care plan section) Acute Rehab PT Goals Patient Stated Goal: to find somewhere to live by myself PT Goal Formulation: With patient Time For Goal Achievement: 10/22/18 Potential to Achieve Goals: Good Progress towards PT goals: Progressing toward goals    Frequency    Min 2X/week      PT Plan Current plan remains appropriate;Discharge plan needs to be updated  Co-evaluation              AM-PAC PT "6 Clicks" Mobility   Outcome Measure  Help needed turning from your back to your side while in a flat bed without using bedrails?: None Help needed moving from lying on your back to sitting on the side of a flat bed without using bedrails?: None Help needed moving to and from a bed to a chair (including a wheelchair)?: A Little Help needed standing up from a chair using your arms (e.g., wheelchair or bedside chair)?: A Little Help  needed to walk in hospital room?: A Little Help needed climbing 3-5 steps with a railing? : A Lot 6 Click Score: 19    End of Session Equipment Utilized During Treatment: Gait belt Activity Tolerance: Patient tolerated treatment well Patient left: in bed;with call bell/phone within reach Nurse Communication: Mobility status PT Visit Diagnosis: Unsteadiness on feet (R26.81);Difficulty in walking, not elsewhere classified (R26.2)     Time: 3716-9678 PT Time Calculation (min) (ACUTE ONLY): 21 min  Charges:  $Gait Training: 8-22 mins                    Mabeline Caras, PT, DPT Acute Rehabilitation Services  Pager [phone] Office [place] 10/12/2018, 2:27 PM

## 2018-10-12 NOTE — Plan of Care (Signed)
  Problem: Education: Goal: Ability to demonstrate management of disease process will improve Outcome: Progressing   Problem: Education: Goal: Ability to verbalize understanding of medication therapies will improve Outcome: Progressing   

## 2018-10-12 NOTE — Clinical Social Work Note (Signed)
Insurance authorization still pending.  Amber Lara, CSW [phone]  

## 2018-10-13 LAB — RENAL FUNCTION PANEL
ALBUMIN: 2.1 g/dL — AB (ref 3.5–5.0)
Anion gap: 9 (ref 5–15)
BUN: 45 mg/dL — ABNORMAL HIGH (ref 6–20)
CALCIUM: 8.4 mg/dL — AB (ref 8.9–10.3)
CO2: 21 mmol/L — AB (ref 22–32)
CREATININE: 5.62 mg/dL — AB (ref 0.44–1.00)
Chloride: 101 mmol/L (ref 98–111)
GFR calc Af Amer: 9 mL/min — ABNORMAL LOW (ref >60)
GFR calc non Af Amer: 8 mL/min — ABNORMAL LOW (ref >60)
Glucose, Bld: 170 mg/dL — ABNORMAL HIGH (ref 70–99)
PHOSPHORUS: 3.9 mg/dL (ref 2.5–4.6)
Potassium: 4.4 mmol/L (ref 3.5–5.1)
Sodium: 131 mmol/L — ABNORMAL LOW (ref 135–145)

## 2018-10-13 LAB — GLUCOSE, CAPILLARY
Glucose-Capillary: 154 mg/dL — ABNORMAL HIGH (ref 70–99)
Glucose-Capillary: 181 mg/dL — ABNORMAL HIGH (ref 70–99)
Glucose-Capillary: 204 mg/dL — ABNORMAL HIGH (ref 70–99)
Glucose-Capillary: 240 mg/dL — ABNORMAL HIGH (ref 70–99)

## 2018-10-13 NOTE — Progress Notes (Addendum)
PROGRESS NOTE    Amber Lara   PRF:[id number]  DOB: 23-Oct-1960  DOA: 10/05/2018 PCP: Patient, No Pcp Per   Brief Narrative:  Amber Lara 58 year old woman PMH CKD stage V, psychosis presented with chest pain and right lower extremity pain.  Found to have acute right lower extremity DVT, worsening renal function.  CKD progressed to ESRD and hemodialysis was initiated and CLIP is being pursued.  Will be stable for discharge when has outpatient hemodialysis arranged.   Subjective: Seen on dialysis. She has no complaints. Asking about discharge.     Assessment & Plan:  Acute right femoral DVT.  VQ scan was normal.  '  --  Continue apixaban.  apparently shew is very non-compliant- Dr [name] spoke with pharmacy about the best agent to anticoagulate with and Apixaban was decided upon.    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 --Discontinue gabapentin permanently given noncompliance and ESRD as recommended by nephrology  Anemia of CKD --Stable.  Followed periodically.  PMH psychosis, paranoia and delusional thoughts, noncompliant with Cogentin --Mentation stable.  Continue Cogentin.  Seizure disorder --Stable.  Continue Keppra.  Chest pain, shortness of breath, resolved.  No PE on VQ scan.  EKG nonacute.  Troponins normal. --No further evaluation planned  Left flank pain, CT renal study nonspecific, suggestion of ureteritis, however patient not symptomatic, no role  for antibiotics per Dr. [name]     Time spent in minutes: 30 DVT prophylaxis: Apixaban Code Status: full code Family Communication:  Disposition Plan: SNF Consultants:   nephrology Procedures:   RIGHT UPPER EXTREMITY FISTULOGRAM, ULTRASOUND GUIDANCE FOR VASCULAR ACCESS Antimicrobials:  Anti-infectives (From admission, onward)   None       Objective: Vitals:   10/13/18 0514 10/13/18 0839 10/13/18 0844 10/13/18 1125  BP: (!) 141/54 (!) 143/62  (!) 110/51  Pulse: 61  62 (!) 58  Resp: 18   16  Temp: 98.6 F (37 C)   97.6 F (36.4 C)  TempSrc: Oral   Oral  SpO2: 95%   99%  Weight: 100.7 kg     Height:        Intake/Output Summary (Last 24 hours) at 10/13/2018 1323 Last data filed at 10/13/2018 0954 Gross per 24 hour  Intake 1320 ml  Output 1700 ml  Net -380 ml   Filed Weights   10/11/18 1319 10/12/18 0016 10/13/18 0514  Weight: 98.7 kg 100.5 kg 100.7 kg    Examination: General exam: Appears comfortable  HEENT: PERRLA, oral mucosa moist, no sclera icterus or thrush Respiratory system: Clear to auscultation. Respiratory effort normal. Cardiovascular system: S1 & S2 heard, RRR.   Gastrointestinal system: Abdomen soft, non-tender, nondistended. Normal bowel sounds. Central nervous system: Alert and oriented. No focal neurological deficits. Extremities: No cyanosis, clubbing or edema Skin: No rashes or ulcers- RUE fistula noted Psychiatry:  Mood & affect appropriate.     Data Reviewed: I have personally reviewed following labs and imaging studies  CBC: Recent Labs  Lab 10/08/18 0443 10/09/18 0710 10/10/18 0515 10/11/18 0413 10/12/18 0532  WBC  6.7 6.0 7.7 6.1 7.7  HGB 8.3* 8.3* 9.2* 8.6* 8.0*  HCT 25.4* 26.0* 28.6* 26.4* 25.0*  MCV 80.4 81.0 80.1 81.0 82.2  PLT 239 221 288 250 884   Basic Metabolic Panel: Recent Labs  Lab 10/07/18 0540 10/08/18 0443 10/09/18 0710 10/10/18 0515 10/11/18 1103  NA 136 130* 130* 135 136  K 3.7 3.9 4.0 4.2 3.7    CL 107 102 104 107 105  CO2 18* 15* 14* 18* 21*  GLUCOSE 146* 304* 284* 191* 92  BUN 72* 75* 80* 81* 42*  CREATININE 7.60* 7.33* 7.63* 7.27* 4.86*  CALCIUM 8.2* 8.2*  8.3* 8.2* 8.7* 8.3*  PHOS  --  4.7*  --   --  3.5   GFR: Estimated Creatinine Clearance: 14.6 mL/min (A) (by C-G formula based on SCr of 4.86 mg/dL (H)). Liver Function Tests: Recent Labs  Lab 10/08/18 0443 10/11/18 1103  ALBUMIN 2.1* 2.3*   No results for input(s): LIPASE, AMYLASE in the last 168 hours. No results for input(s): AMMONIA in the last 168 hours. Coagulation Profile: No results for input(s): INR, PROTIME in the last 168 hours. Cardiac Enzymes: No results for input(s): CKTOTAL, CKMB, CKMBINDEX, TROPONINI in the last 168 hours. BNP (last 3 results) No results for input(s): PROBNP in the last 8760 hours. HbA1C: No results for input(s): HGBA1C in the last 72 hours. CBG: Recent Labs  Lab 10/12/18 1144 10/12/18 1642 10/12/18 2128 10/13/18 0742 10/13/18 1123  GLUCAP 118* 143* 176* 154* 181*   Lipid Profile: No results for input(s): CHOL, HDL, LDLCALC, TRIG, CHOLHDL, LDLDIRECT in the last 72 hours. Thyroid Function Tests: No results for input(s): TSH, T4TOTAL, FREET4, T3FREE, THYROIDAB in the last 72 hours. Anemia Panel: No results for input(s): VITAMINB12, FOLATE, FERRITIN, TIBC, IRON, RETICCTPCT in the last 72 hours. Urine analysis:    Component Value Date/Time   COLORURINE STRAW (A) 10/06/2018 1330   APPEARANCEUR CLEAR 10/06/2018 1330   LABSPEC 1.010 10/06/2018 1330   PHURINE 5.0 10/06/2018 1330   GLUCOSEU >=500 (A) 10/06/2018 1330   HGBUR SMALL (A) 10/06/2018 1330   BILIRUBINUR NEGATIVE 10/06/2018 1330   KETONESUR NEGATIVE 10/06/2018 1330   PROTEINUR >=300 (A) 10/06/2018 1330   UROBILINOGEN 0.2 08/07/2017 0952   NITRITE NEGATIVE 10/06/2018 1330   LEUKOCYTESUR NEGATIVE 10/06/2018 1330   Sepsis Labs: @LABRCNTIP (procalcitonin:4,lacticidven:4) )No results found for this or any  previous visit (from the past 240 hour(s)).       Radiology Studies: No results found.    Scheduled Meds: . amLODipine  10 mg Oral Daily  . apixaban  10 mg Oral BID   Followed by  . [START ON 10/18/2018] apixaban  5 mg Oral BID  . atorvastatin  40 mg Oral q1800  . benztropine  2 mg Oral BID  . Chlorhexidine Gluconate Cloth  6 each Topical Q0600  . Chlorhexidine Gluconate Cloth  6 each Topical Q0600  . Chlorhexidine Gluconate Cloth  6 each Topical Q0600  . darbepoetin (ARANESP) injection - DIALYSIS  60 mcg Intravenous Q14 Days  . insulin aspart  0-9 Units Subcutaneous TID WC  . insulin aspart  3 Units Subcutaneous TID WC  . insulin glargine  30 Units Subcutaneous QHS  . levETIRAcetam  500 mg Oral BID  . lisinopril  10 mg Oral Daily  . metoprolol tartrate  50 mg Oral BID   Continuous Infusions: . ferric gluconate (FERRLECIT/NULECIT) IV       LOS: 4 days      Debbe Odea, MD [facility] Pager: [email or web address] Password [email or web address] 10/13/2018, 1:23 PM

## 2018-10-13 NOTE — Progress Notes (Signed)
[facility]    NEPHROLOGY PROGRESS NOTE  SUBJECTIVE:   No acute complaints today.  Continues to have LE edema.  No f/c, no cp, no n/v.  Is ambulating in the hallway.  No dysuria or weakness.  Denies HA and blurred vision.  Did not receive HD yesterday due to logistics.  Will plan for HD today.  All other ROS are negative.   OBJECTIVE:  Vitals:   10/13/18 0839 10/13/18 0844  BP: (!) 143/62   Pulse:  62  Resp:    Temp:    SpO2:     I/O last 3 completed shifts: In: 1440 [P.O.:1440] Out: 66 [Urine:3000]   Genearl:  AAOx3 NAD HEENT: MMM [place] AT anicteric sclera Neck:  No JVD, no adenopathy CV:  Heart RRR with 3/6 systolic murmur Lungs:  L/S CTA bilaterally Abd:  abd SNT/ND with normal BS GU:  Bladder non-palpable Extremities:  (+)2 bilateral LE edema. Skin:  No skin rash    LABS:  CBC Latest Ref Rng & Units 10/12/2018 10/11/2018 10/10/2018  WBC 4.0 - 10.5 K/uL 7.7 6.1 7.7  Hemoglobin 12.0 - 15.0 g/dL 8.0(L) 8.6(L) 9.2(L)  Hematocrit 36.0 - 46.0 % 25.0(L) 26.4(L) 28.6(L)  Platelets 150 - 400 K/uL 238 250 288    CMP Latest Ref Rng & Units 10/11/2018 10/10/2018 10/09/2018  Glucose 70 - 99 mg/dL 92 191(H) 284(H)  BUN 6 - 20 mg/dL 42(H) 81(H) 80(H)  Creatinine 0.44 - 1.00 mg/dL 4.86(H) 7.27(H) 7.63(H)  Sodium 135 - 145 mmol/L 136 135 130(L)  Potassium 3.5 - 5.1 mmol/L 3.7 4.2 4.0  Chloride 98 - 111 mmol/L 105 107 104  CO2 22 - 32 mmol/L 21(L) 18(L) 14(L)  Calcium 8.9 - 10.3 mg/dL 8.3(L) 8.7(L) 8.2(L)  Total Protein 6.5 - 8.1 g/dL - - -  Total Bilirubin 0.3 - 1.2 mg/dL - - -  Alkaline Phos 38 - 126 U/L - - -  AST 15 - 41 U/L - - -  ALT 0 - 44 U/L - - -    Lab Results  Component Value Date   PTH 68 (H) 10/08/2018   PTH Comment 10/08/2018   CALCIUM 8.3 (L) 10/11/2018   CAION 1.16 06/22/2017   PHOS 3.5 10/11/2018       Component Value Date/Time   COLORURINE STRAW (A) 10/06/2018 1330   APPEARANCEUR CLEAR 10/06/2018 1330   LABSPEC 1.010 10/06/2018 1330   PHURINE 5.0 10/06/2018 1330   GLUCOSEU >=500 (A) 10/06/2018 1330   HGBUR SMALL (A) 10/06/2018 1330   BILIRUBINUR NEGATIVE 10/06/2018 1330   KETONESUR NEGATIVE 10/06/2018 1330   PROTEINUR >=300 (A) 10/06/2018 1330   UROBILINOGEN 0.2 08/07/2017 0952   NITRITE NEGATIVE 10/06/2018 1330   LEUKOCYTESUR NEGATIVE 10/06/2018 1330      Component Value Date/Time   HCO3 20.0 08/03/2017 0845   TCO2 22 06/22/2017 2127   ACIDBASEDEF 6.0 (H) 08/03/2017 0845   O2SAT 42.6 08/03/2017 0845       Component Value Date/Time   IRON 28 10/08/2018 0443   IRON 30 07/19/2018 1210   TIBC 199 (L) 10/08/2018 0443   TIBC 201 (L) 07/19/2018 1210   FERRITIN 414 (H) 07/19/2018 1210   IRONPCTSAT 14 10/08/2018 0443   IRONPCTSAT 15 07/19/2018 1210       ASSESSMENT/PLAN:     Problem List Items Addressed This Visit      Cardiovascular and Mediastinum   Acute deep vein thrombosis (DVT) of femoral vein of right lower extremity (HCC) - Primary  Relevant Medications   heparin bolus via infusion 4,500 Units (Completed)   amLODipine (NORVASC) tablet 10 mg   atorvastatin (LIPITOR) tablet 40 mg   metoprolol tartrate (LOPRESSOR) tablet 50 mg   furosemide (LASIX) injection 80 mg (Completed)   apixaban (ELIQUIS) tablet 10 mg   apixaban (ELIQUIS) tablet 5 mg (Start on 10/18/2018 10:00 AM)   lisinopril (PRINIVIL,ZESTRIL) tablet 10 mg     Genitourinary   ESRD (end stage renal disease) (HCC)     Other   RESOLVED: Chest pain    Other Visit Diagnoses    Hypertension, unspecified type       Relevant Medications   heparin bolus via infusion 4,500 Units (Completed)   amLODipine (NORVASC) tablet 10 mg   atorvastatin (LIPITOR) tablet 40 mg   metoprolol tartrate (LOPRESSOR) tablet 50 mg   furosemide (LASIX) injection 80 mg (Completed)   apixaban (ELIQUIS) tablet 10 mg   apixaban (ELIQUIS) tablet 5 mg (Start on 10/18/2018 10:00 AM)   lisinopril (PRINIVIL,ZESTRIL) tablet 10 mg   Hyperglycemia       Dialysis complication        Relevant Orders   IR DIALY SHUNT INTRO NEEDLE/INTRACATH INITIAL W/IMG RIGHT (Completed)   IR US Guide Vasc Access Right (Completed)      1.  ESRD.  Plan HD MWF.  Awaiting OP placement.  No uremic symptoms.  For HD today (did not receive yesterday) 2.  HTN.  BP elevated.  Lisinopril added yesterday. 3.  Acute DVT.  Continue eliquis 4.  Secondary hyperparathyroidism.  Phos and PTH at goal 5.  Dispo.  Awaiting placement in [place], DO, [place]

## 2018-10-14 LAB — GLUCOSE, CAPILLARY
Glucose-Capillary: 136 mg/dL — ABNORMAL HIGH (ref 70–99)
Glucose-Capillary: 113 mg/dL — ABNORMAL HIGH (ref 70–99)
Glucose-Capillary: 173 mg/dL — ABNORMAL HIGH (ref 70–99)
Glucose-Capillary: 240 mg/dL — ABNORMAL HIGH (ref 70–99)

## 2018-10-14 NOTE — Progress Notes (Signed)
[facility]    NEPHROLOGY PROGRESS NOTE  SUBJECTIVE:   No acute complaints today.  Continues to have LE edema.  No f/c, no cp, no n/v.  Is ambulating in the hallway.  No dysuria or weakness.  Denies HA and blurred vision.  We will plan for hemodialysis tomorrow.  All other ROS are negative.   OBJECTIVE:  Vitals:   10/14/18 0815 10/14/18 1209  BP: (!) 156/67 107/64  Pulse: 71 (!) 58  Resp:  18  Temp:    SpO2:  99%   I/O last 3 completed shifts: In: 1560 [P.O.:1560] Out: 5100 [Urine:3100; Other:2000]   Genearl:  AAOx3 NAD HEENT: MMM [place] AT anicteric sclera Neck:  No JVD, no adenopathy CV:  Heart RRR with 3/6 systolic murmur Lungs:  L/S CTA bilaterally Abd:  abd SNT/ND with normal BS GU:  Bladder non-palpable Extremities:  (+)1 bilateral LE edema. Skin:  No skin rash    LABS:  CBC Latest Ref Rng & Units 10/12/2018 10/11/2018 10/10/2018  WBC 4.0 - 10.5 K/uL 7.7 6.1 7.7  Hemoglobin 12.0 - 15.0 g/dL 8.0(L) 8.6(L) 9.2(L)  Hematocrit 36.0 - 46.0 % 25.0(L) 26.4(L) 28.6(L)  Platelets 150 - 400 K/uL 238 250 288    CMP Latest Ref Rng & Units 10/13/2018 10/11/2018 10/10/2018  Glucose 70 - 99 mg/dL 170(H) 92 191(H)  BUN 6 - 20 mg/dL 45(H) 42(H) 81(H)  Creatinine 0.44 - 1.00 mg/dL 5.62(H) 4.86(H) 7.27(H)  Sodium 135 - 145 mmol/L 131(L) 136 135  Potassium 3.5 - 5.1 mmol/L 4.4 3.7 4.2  Chloride 98 - 111 mmol/L 101 105 107  CO2 22 - 32 mmol/L 21(L) 21(L) 18(L)  Calcium 8.9 - 10.3 mg/dL 8.4(L) 8.3(L) 8.7(L)  Total Protein 6.5 - 8.1 g/dL - - -  Total Bilirubin 0.3 - 1.2 mg/dL - - -  Alkaline Phos 38 - 126 U/L - - -  AST 15 - 41 U/L - - -  ALT 0 - 44 U/L - - -    Lab Results  Component Value Date   PTH 68 (H) 10/08/2018   PTH Comment 10/08/2018   CALCIUM 8.4 (L) 10/13/2018   CAION 1.16 06/22/2017   PHOS 3.9 10/13/2018       Component Value Date/Time   COLORURINE STRAW (A) 10/06/2018 1330   APPEARANCEUR CLEAR 10/06/2018 1330   LABSPEC 1.010 10/06/2018 1330   PHURINE  5.0 10/06/2018 1330   GLUCOSEU >=500 (A) 10/06/2018 1330   HGBUR SMALL (A) 10/06/2018 1330   BILIRUBINUR NEGATIVE 10/06/2018 1330   KETONESUR NEGATIVE 10/06/2018 1330   PROTEINUR >=300 (A) 10/06/2018 1330   UROBILINOGEN 0.2 08/07/2017 0952   NITRITE NEGATIVE 10/06/2018 1330   LEUKOCYTESUR NEGATIVE 10/06/2018 1330      Component Value Date/Time   HCO3 20.0 08/03/2017 0845   TCO2 22 06/22/2017 2127   ACIDBASEDEF 6.0 (H) 08/03/2017 0845   O2SAT 42.6 08/03/2017 0845       Component Value Date/Time   IRON 28 10/08/2018 0443   IRON 30 07/19/2018 1210   TIBC 199 (L) 10/08/2018 0443   TIBC 201 (L) 07/19/2018 1210   FERRITIN 414 (H) 07/19/2018 1210   IRONPCTSAT 14 10/08/2018 0443   IRONPCTSAT 15 07/19/2018 1210       ASSESSMENT/PLAN:     Problem List Items Addressed This Visit      Cardiovascular and Mediastinum   Acute deep vein thrombosis (DVT) of femoral vein of right lower extremity (HCC) - Primary   Relevant Medications   heparin  bolus via infusion 4,500 Units (Completed)   amLODipine (NORVASC) tablet 10 mg   atorvastatin (LIPITOR) tablet 40 mg   metoprolol tartrate (LOPRESSOR) tablet 50 mg   furosemide (LASIX) injection 80 mg (Completed)   apixaban (ELIQUIS) tablet 10 mg   apixaban (ELIQUIS) tablet 5 mg (Start on 10/18/2018 10:00 AM)   lisinopril (PRINIVIL,ZESTRIL) tablet 10 mg     Genitourinary   ESRD (end stage renal disease) (HCC)     Other   RESOLVED: Chest pain    Other Visit Diagnoses    Hypertension, unspecified type       Relevant Medications   heparin bolus via infusion 4,500 Units (Completed)   amLODipine (NORVASC) tablet 10 mg   atorvastatin (LIPITOR) tablet 40 mg   metoprolol tartrate (LOPRESSOR) tablet 50 mg   furosemide (LASIX) injection 80 mg (Completed)   apixaban (ELIQUIS) tablet 10 mg   apixaban (ELIQUIS) tablet 5 mg (Start on 10/18/2018 10:00 AM)   lisinopril (PRINIVIL,ZESTRIL) tablet 10 mg   Hyperglycemia       Dialysis complication        Relevant Orders   IR DIALY SHUNT INTRO NEEDLE/INTRACATH INITIAL W/IMG RIGHT (Completed)   IR US Guide Vasc Access Right (Completed)      1.  ESRD.  Plan HD MWF.  Awaiting OP placement.  No uremic symptoms.  For HD tomorrow 2.  HTN.  BP elevated.  Lisinopril recently added with overall improvement. 3.  Acute DVT.  Continue eliquis 4.  Secondary hyperparathyroidism.  Phos and PTH at goal 5.  Anemia of chronic kidney disease.  Is receiving IV iron with dialysis.  We will continue Aranesp 6.  Seizure disorder.  Continue Keppra. 7.  Acute right femoral DVT.  On Eliquis. 8.  Type 2 diabetes.  Has been uncontrolled as an outpatient.  Continue current insulin therapy.    [place], DO, [place]

## 2018-10-14 NOTE — Progress Notes (Signed)
PROGRESS NOTE    Amber Lara  MHD:[id number] DOB: 02-28-1961 DOA: 10/05/2018 PCP: Patient, No Pcp Per   Brief Narrative: Patient is a 58 year old female with past medical history of CKD stage V, psychosis who presented initially with complaints of chest pain and right lower extremity pain.  She was found to have acute right lower extremity DVT.  She was also found to have worsening renal function.  CKD progressed to ESRD .  Nephrology consulted and she was started on hemodialysis.  Currently she is waiting for CLIP.  Physical therapy recommended skilled nursing facility on discharge.  Social worker following.  Assessment & Plan:   Principal Problem:   Acute renal failure (ARF) (HCC) Active Problems:   Type 2 diabetes mellitus with hyperglycemia (HCC)   Normochromic normocytic anemia   Seizure (HCC)   CKD (chronic kidney disease), stage V (HCC)   Acute deep vein thrombosis (DVT) of femoral vein of right lower extremity (HCC)   ESRD (end stage renal disease) ([place])  Acute right femoral DVT: VQ scan was normal.  Started on Eliquis.  CKD stage V: Progressed to ESRD.  Now on dialysis.  Started on 12/30.  Underwent AV graft placement on right arm by IR on 12/31.  Awaiting CLIP.  Hypertension: Currently blood pressure stable.  Continue current medications  Diabetes type 2: Hemoglobin A1c of 11.8 in August.  Noncompliant with insulin as an outpatient.  Continue current insulin orders.  Anemia of CKD: Currently stable  History of psychosis/paranoia/delusional thoughts: Currently mentation stable.  Continue Cogentin  Seizure disorder: Continue Keppra  Left flank pain: CT renal study nonspecific, suggestion of ureteritis, however patient not symptomatic, no role for antibiotics per Dr. [name]           DVT prophylaxis:Eliquis Code Status: Full Family Communication: None present at the bedside Disposition Plan: Skilled nursing facility as soon as the bed is available.   Awaiting CLIP   Consultants: Nephrology  Procedures: Dialysis, AV graft placement  Antimicrobials: None  Subjective: Patient seen and examined the bedside this morning.  Remains comfortable.  Hemodynamically stable.  Denies any chest pain or shortness of breath.  Objective: Vitals:   10/14/18 0300 10/14/18 0450 10/14/18 0815 10/14/18 1209  BP:  (!) 143/91 (!) 156/67 107/64  Pulse:  67 71 (!) 58  Resp:  18  18  Temp:  98.3 F (36.8 C)    TempSrc:  Oral    SpO2:  100%  99%  Weight: 100 kg     Height:        Intake/Output Summary (Last 24 hours) at 10/14/2018 1234 Last data filed at 10/14/2018 0944 Gross per 24 hour  Intake 600 ml  Output 3400 ml  Net -2800 ml   Filed Weights   10/13/18 1239 10/13/18 1625 10/14/18 0300  Weight: 100.4 kg 98.3 kg 100 kg    Examination:  General exam: Appears calm and comfortable ,Not in distress,obese HEENT:PERRL,Oral mucosa moist, Ear/Nose normal on gross exam Respiratory system: Bilateral equal air entry, normal vesicular breath sounds, no wheezes or crackles  Cardiovascular system: S1 & S2 heard, RRR. No JVD, murmurs, rubs, gallops or clicks. No pedal edema. Gastrointestinal system: Abdomen is nondistended, soft and nontender. No organomegaly or masses felt. Normal bowel sounds heard. Central nervous system: Alert and oriented. No focal neurological deficits. Extremities: No edema, no clubbing ,no cyanosis, distal peripheral pulses palpable. AV graft on the right arm Skin: No rashes, lesions or ulcers,no icterus ,no pallor MSK: Normal muscle bulk,tone ,power  Psychiatry: Judgement and insight appear normal. Mood & affect appropriate.     Data Reviewed: I have personally reviewed following labs and imaging studies  CBC: Recent Labs  Lab 10/08/18 0443 10/09/18 0710 10/10/18 0515 10/11/18 0413 10/12/18 0532  WBC 6.7 6.0 7.7 6.1 7.7  HGB 8.3* 8.3* 9.2* 8.6* 8.0*  HCT 25.4* 26.0* 28.6* 26.4* 25.0*  MCV 80.4 81.0 80.1 81.0 82.2    PLT 239 221 288 250 428   Basic Metabolic Panel: Recent Labs  Lab 10/08/18 0443 10/09/18 0710 10/10/18 0515 10/11/18 1103 10/13/18 1339  NA 130* 130* 135 136 131*  K 3.9 4.0 4.2 3.7 4.4  CL 102 104 107 105 101  CO2 15* 14* 18* 21* 21*  GLUCOSE 304* 284* 191* 92 170*  BUN 75* 80* 81* 42* 45*  CREATININE 7.33* 7.63* 7.27* 4.86* 5.62*  CALCIUM 8.2*  8.3* 8.2* 8.7* 8.3* 8.4*  PHOS 4.7*  --   --  3.5 3.9   GFR: Estimated Creatinine Clearance: 12.6 mL/min (A) (by C-G formula based on SCr of 5.62 mg/dL (H)). Liver Function Tests: Recent Labs  Lab 10/08/18 0443 10/11/18 1103 10/13/18 1339  ALBUMIN 2.1* 2.3* 2.1*   No results for input(s): LIPASE, AMYLASE in the last 168 hours. No results for input(s): AMMONIA in the last 168 hours. Coagulation Profile: No results for input(s): INR, PROTIME in the last 168 hours. Cardiac Enzymes: No results for input(s): CKTOTAL, CKMB, CKMBINDEX, TROPONINI in the last 168 hours. BNP (last 3 results) No results for input(s): PROBNP in the last 8760 hours. HbA1C: No results for input(s): HGBA1C in the last 72 hours. CBG: Recent Labs  Lab 10/13/18 1123 10/13/18 1711 10/13/18 2124 10/14/18 0743 10/14/18 1132  GLUCAP 181* 204* 240* 136* 113*   Lipid Profile: No results for input(s): CHOL, HDL, LDLCALC, TRIG, CHOLHDL, LDLDIRECT in the last 72 hours. Thyroid Function Tests: No results for input(s): TSH, T4TOTAL, FREET4, T3FREE, THYROIDAB in the last 72 hours. Anemia Panel: No results for input(s): VITAMINB12, FOLATE, FERRITIN, TIBC, IRON, RETICCTPCT in the last 72 hours. Sepsis Labs: No results for input(s): PROCALCITON, LATICACIDVEN in the last 168 hours.  No results found for this or any previous visit (from the past 240 hour(s)).       Radiology Studies: No results found.      Scheduled Meds: . amLODipine  10 mg Oral Daily  . apixaban  10 mg Oral BID   Followed by  . [START ON 10/18/2018] apixaban  5 mg Oral BID  .  atorvastatin  40 mg Oral q1800  . benztropine  2 mg Oral BID  . Chlorhexidine Gluconate Cloth  6 each Topical Q0600  . Chlorhexidine Gluconate Cloth  6 each Topical Q0600  . Chlorhexidine Gluconate Cloth  6 each Topical Q0600  . darbepoetin (ARANESP) injection - DIALYSIS  60 mcg Intravenous Q14 Days  . insulin aspart  0-9 Units Subcutaneous TID WC  . insulin aspart  3 Units Subcutaneous TID WC  . insulin glargine  30 Units Subcutaneous QHS  . levETIRAcetam  500 mg Oral BID  . lisinopril  10 mg Oral Daily  . metoprolol tartrate  50 mg Oral BID   Continuous Infusions: . ferric gluconate (FERRLECIT/NULECIT) IV       LOS: 5 days    Time spent: 35 mins.More than 50% of that time was spent in counseling and/or coordination of care.      Shelly Coss, MD [facility] Pager [phone]  If 7PM-7AM, please contact night-coverage  [email or web address] Password [facility] 10/14/2018, 12:34 PM

## 2018-10-15 LAB — BASIC METABOLIC PANEL
Anion gap: 8 (ref 5–15)
BUN: 36 mg/dL — ABNORMAL HIGH (ref 6–20)
CALCIUM: 8.3 mg/dL — AB (ref 8.9–10.3)
CO2: 23 mmol/L (ref 22–32)
Chloride: 100 mmol/L (ref 98–111)
Creatinine, Ser: 5.49 mg/dL — ABNORMAL HIGH (ref 0.44–1.00)
GFR calc Af Amer: 9 mL/min — ABNORMAL LOW (ref >60)
GFR calc non Af Amer: 8 mL/min — ABNORMAL LOW (ref >60)
Glucose, Bld: 184 mg/dL — ABNORMAL HIGH (ref 70–99)
Potassium: 4.1 mmol/L (ref 3.5–5.1)
Sodium: 131 mmol/L — ABNORMAL LOW (ref 135–145)

## 2018-10-15 LAB — CBC
HCT: 26.1 % — ABNORMAL LOW (ref 36.0–46.0)
Hemoglobin: 8.2 g/dL — ABNORMAL LOW (ref 12.0–15.0)
MCH: 26.5 pg (ref 26.0–34.0)
MCHC: 31.4 g/dL (ref 30.0–36.0)
MCV: 84.2 fL (ref 80.0–100.0)
Platelets: 273 10*3/uL (ref 150–400)
RBC: 3.1 MIL/uL — AB (ref 3.87–5.11)
RDW: 14.7 % (ref 11.5–15.5)
WBC: 6.3 10*3/uL (ref 4.0–10.5)
nRBC: 0.3 % — ABNORMAL HIGH (ref 0.0–0.2)

## 2018-10-15 LAB — GLUCOSE, CAPILLARY
Glucose-Capillary: 136 mg/dL — ABNORMAL HIGH (ref 70–99)
Glucose-Capillary: 353 mg/dL — ABNORMAL HIGH (ref 70–99)
Glucose-Capillary: 176 mg/dL — ABNORMAL HIGH (ref 70–99)

## 2018-10-15 MED ORDER — LIDOCAINE-PRILOCAINE 2.5-2.5 % EX CREA
1.0000 "application " | TOPICAL_CREAM | CUTANEOUS | Status: DC | PRN
  Filled 2018-10-15: qty 5

## 2018-10-15 MED ORDER — TRAMADOL HCL 50 MG PO TABS: 50.0000 mg | ORAL_TABLET | Freq: Two times a day (BID) | ORAL | Status: DC | PRN

## 2018-10-15 MED ORDER — LIDOCAINE HCL (PF) 1 % IJ SOLN: 5.0000 mL | INTRAMUSCULAR | Status: DC | PRN

## 2018-10-15 MED ORDER — SODIUM CHLORIDE 0.9 % IV SOLN: 100.0000 mL | INTRAVENOUS | Status: DC | PRN

## 2018-10-15 MED ORDER — HEPARIN SODIUM (PORCINE) 1000 UNIT/ML DIALYSIS
1000.0000 [IU] | INTRAMUSCULAR | Status: DC | PRN
  Filled 2018-10-15: qty 1

## 2018-10-15 MED ORDER — ACETAMINOPHEN 325 MG PO TABS
ORAL_TABLET | ORAL | Status: AC
  Filled 2018-10-15: qty 2

## 2018-10-15 MED ORDER — PENTAFLUOROPROP-TETRAFLUOROETH EX AERO: 1.0000 "application " | INHALATION_SPRAY | CUTANEOUS | Status: DC | PRN

## 2018-10-15 NOTE — Procedures (Signed)
Patient seen on dialysis for end-stage renal disease.  Currently denies headaches, fevers, chills, chest pain, shortness of breath, nausea, vomiting, diarrhea or dysuria.  All other review of systems are negative.  Blood pressure (!) 128/58, pulse (!) 59, temperature 97.9 F (36.6 C), temperature source Oral, resp. rate 20, height 5\' 3"  (1.6 m), weight 101.3 kg, SpO2 93 %.  She is alert and oriented without distress.  HEENT exam reveals moist mucous membranes.  She has normocephalic, atraumatic, and her sclera are anicteric.  Her neck was supple without lymphadenopathy.  Heart was regular.  Her lungs were clear to auscultation bilaterally.  Her abdomen was soft, nontender, nondistended.  Her extremities were without clubbing or cyanosis.  +1 bilateral lower extremity edema was noted.  F1 80 NR  QB 400  Qd 800  3K bath  Heparin 1000 units 4 hours UF 1.5L  Pt with low bp with increased UF attempt - will decrease UF to 1.5L.  Next HD Wednesday.

## 2018-10-15 NOTE — Clinical Social Work Note (Addendum)
Patient has insurance approval to discharge to [facility] SNF once clipped to outpatient HD center.  Amber Lara, [place] [phone]  2:55 pm Patient has been set up with TTS at [facility] with a 12:10 chair time. SNF admissions coordinator is aware and will see if they can arrange transport for tomorrow.  Amber Lara, [place]

## 2018-10-15 NOTE — Progress Notes (Signed)
Out patient dialysis schedule given to patient.

## 2018-10-15 NOTE — Progress Notes (Signed)
PROGRESS NOTE    [name]  IPJ:[id number] DOB: 29-Jul-1961 DOA: 10/05/2018 PCP: Patient, No Pcp Per   Brief Narrative: Patient is a 58 year old female with past medical history of CKD stage V, psychosis who presented initially with complaints of chest pain and right lower extremity pain.  She was found to have acute right lower extremity DVT.  She was also found to have worsening renal function.  CKD progressed to ESRD .  Nephrology consulted and she was started on hemodialysis.  Currently she is waiting for CLIP for outpatient dialysis center.  Physical therapy recommended skilled nursing facility on discharge.  Social worker following.  Assessment & Plan:   Principal Problem:   Acute renal failure (ARF) (HCC) Active Problems:   Type 2 diabetes mellitus with hyperglycemia (HCC)   Normochromic normocytic anemia   Seizure (HCC)   CKD (chronic kidney disease), stage V (HCC)   Acute deep vein thrombosis (DVT) of femoral vein of right lower extremity (HCC)   ESRD (end stage renal disease) ([place])  Acute right femoral DVT: VQ scan was normal.  Started on Eliquis.  CKD stage V: Progressed to ESRD.  Now on dialysis.  Started on 12/30.  Underwent AV graft placement on right arm by IR on 12/31.  Awaiting CLIP.  Hypertension: Currently blood pressure stable.  Continue current medications  Diabetes type 2: Hemoglobin A1c of 11.8 in August.  Noncompliant with insulin as an outpatient.  Continue current insulin orders.  Anemia of CKD: Currently stable  History of psychosis/paranoia/delusional thoughts: Currently mentation stable.  Continue Cogentin  Seizure disorder: Continue Keppra  Left flank pain: CT renal study nonspecific, suggestion of ureteritis, however patient not symptomatic, no role for antibiotics per Dr. [name]  Deconditioning/debility: She has insurance approval for discharge to [facility] skilled nursing facility.  Social worker following.  Will discharge her as soon as  clip process is done.         DVT prophylaxis:Eliquis Code Status: Full Family Communication: None present at the bedside Disposition Plan: Skilled nursing facility as soon as  CLIP finalizes   Consultants: Nephrology  Procedures: Dialysis, AV graft placement  Antimicrobials: None  Subjective: Patient seen and examined the bedside this morning at the dialysis .  Remains comfortable.  Hemodynamically stable. No active issues Objective: Vitals:   10/15/18 1100 10/15/18 1130 10/15/18 1200 10/15/18 1205  BP: (!) 120/58 133/64 132/60 (!) 124/55  Pulse: 60 (S) 60 60 63  Resp:    18  Temp:    97.8 F (36.6 C)  TempSrc:    Oral  SpO2:      Weight:    99.2 kg  Height:        Intake/Output Summary (Last 24 hours) at 10/15/2018 1254 Last data filed at 10/15/2018 1205 Gross per 24 hour  Intake 360 ml  Output 2700 ml  Net -2340 ml   Filed Weights   10/15/18 0659 10/15/18 0740 10/15/18 1205  Weight: 100.9 kg 101.3 kg 99.2 kg    Examination:  General exam: Appears calm and comfortable ,Not in distress,obese HEENT:PERRL,Oral mucosa moist, Ear/Nose normal on gross exam Respiratory system: Bilateral equal air entry, normal vesicular breath sounds, no wheezes or crackles  Cardiovascular system: S1 & S2 heard, RRR. No JVD, murmurs, rubs, gallops or clicks. No pedal edema. Gastrointestinal system: Abdomen is nondistended, soft and nontender. No organomegaly or masses felt. Normal bowel sounds heard. Central nervous system: Alert and oriented. No focal neurological deficits. Extremities: No edema, no clubbing ,no cyanosis,  distal peripheral pulses palpable. AV graft on the right arm Skin: No rashes, lesions or ulcers,no icterus ,no pallor MSK: Normal muscle bulk,tone ,power Psychiatry: Judgement and insight appear normal. Mood & affect appropriate.     Data Reviewed: I have personally reviewed following labs and imaging studies  CBC: Recent Labs  Lab 10/09/18 0710  10/10/18 0515 10/11/18 0413 10/12/18 0532 10/15/18 0800  WBC 6.0 7.7 6.1 7.7 6.3  HGB 8.3* 9.2* 8.6* 8.0* 8.2*  HCT 26.0* 28.6* 26.4* 25.0* 26.1*  MCV 81.0 80.1 81.0 82.2 84.2  PLT 221 288 250 238 562   Basic Metabolic Panel: Recent Labs  Lab 10/09/18 0710 10/10/18 0515 10/11/18 1103 10/13/18 1339 10/15/18 0546  NA 130* 135 136 131* 131*  K 4.0 4.2 3.7 4.4 4.1  CL 104 107 105 101 100  CO2 14* 18* 21* 21* 23  GLUCOSE 284* 191* 92 170* 184*  BUN 80* 81* 42* 45* 36*  CREATININE 7.63* 7.27* 4.86* 5.62* 5.49*  CALCIUM 8.2* 8.7* 8.3* 8.4* 8.3*  PHOS  --   --  3.5 3.9  --    GFR: Estimated Creatinine Clearance: 12.8 mL/min (A) (by C-G formula based on SCr of 5.49 mg/dL (H)). Liver Function Tests: Recent Labs  Lab 10/11/18 1103 10/13/18 1339  ALBUMIN 2.3* 2.1*   No results for input(s): LIPASE, AMYLASE in the last 168 hours. No results for input(s): AMMONIA in the last 168 hours. Coagulation Profile: No results for input(s): INR, PROTIME in the last 168 hours. Cardiac Enzymes: No results for input(s): CKTOTAL, CKMB, CKMBINDEX, TROPONINI in the last 168 hours. BNP (last 3 results) No results for input(s): PROBNP in the last 8760 hours. HbA1C: No results for input(s): HGBA1C in the last 72 hours. CBG: Recent Labs  Lab 10/13/18 2124 10/14/18 0743 10/14/18 1132 10/14/18 1618 10/14/18 2115  GLUCAP 240* 136* 113* 173* 240*   Lipid Profile: No results for input(s): CHOL, HDL, LDLCALC, TRIG, CHOLHDL, LDLDIRECT in the last 72 hours. Thyroid Function Tests: No results for input(s): TSH, T4TOTAL, FREET4, T3FREE, THYROIDAB in the last 72 hours. Anemia Panel: No results for input(s): VITAMINB12, FOLATE, FERRITIN, TIBC, IRON, RETICCTPCT in the last 72 hours. Sepsis Labs: No results for input(s): PROCALCITON, LATICACIDVEN in the last 168 hours.  No results found for this or any previous visit (from the past 240 hour(s)).       Radiology Studies: No results  found.      Scheduled Meds: . acetaminophen      . amLODipine  10 mg Oral Daily  . apixaban  10 mg Oral BID   Followed by  . [START ON 10/18/2018] apixaban  5 mg Oral BID  . atorvastatin  40 mg Oral q1800  . benztropine  2 mg Oral BID  . Chlorhexidine Gluconate Cloth  6 each Topical Q0600  . Chlorhexidine Gluconate Cloth  6 each Topical Q0600  . Chlorhexidine Gluconate Cloth  6 each Topical Q0600  . darbepoetin (ARANESP) injection - DIALYSIS  60 mcg Intravenous Q14 Days  . insulin aspart  0-9 Units Subcutaneous TID WC  . insulin aspart  3 Units Subcutaneous TID WC  . insulin glargine  30 Units Subcutaneous QHS  . levETIRAcetam  500 mg Oral BID  . lisinopril  10 mg Oral Daily  . metoprolol tartrate  50 mg Oral BID   Continuous Infusions: . ferric gluconate (FERRLECIT/NULECIT) IV 125 mg (10/15/18 1101)     LOS: 6 days    Time spent: 25 mins.More than 50%  of that time was spent in counseling and/or coordination of care.      Shelly Coss, MD [facility] Pager [phone]  If 7PM-7AM, please contact night-coverage [email or web address] Password [facility] 10/15/2018, 12:54 PM

## 2018-10-15 NOTE — Progress Notes (Signed)
Chaplain responded to spiritual care consult.  Patient requests prayer.  Patient tearful during visit. Patient expressed frustration that staff has not been responsive. "I feel forgotten.  I feel invisible." Chaplain provided ministry of presence and prayer. Will refer to unit chaplain.   Tamsen Snider Pager [phone]

## 2018-10-16 LAB — GLUCOSE, CAPILLARY
Glucose-Capillary: 146 mg/dL — ABNORMAL HIGH (ref 70–99)
Glucose-Capillary: 150 mg/dL — ABNORMAL HIGH (ref 70–99)

## 2018-10-16 MED ORDER — SODIUM CHLORIDE 0.9 % IV SOLN: 100.0000 mL | INTRAVENOUS | Status: DC | PRN

## 2018-10-16 MED ORDER — PENTAFLUOROPROP-TETRAFLUOROETH EX AERO: 1.0000 "application " | INHALATION_SPRAY | CUTANEOUS | Status: DC | PRN

## 2018-10-16 MED ORDER — HEPARIN SODIUM (PORCINE) 1000 UNIT/ML DIALYSIS: 1000.0000 [IU] | INTRAMUSCULAR | Status: DC | PRN

## 2018-10-16 MED ORDER — LIDOCAINE-PRILOCAINE 2.5-2.5 % EX CREA: 1.0000 "application " | TOPICAL_CREAM | CUTANEOUS | Status: DC | PRN

## 2018-10-16 MED ORDER — INSULIN GLARGINE 100 UNIT/ML ~~LOC~~ SOLN: 30.0000 [IU] | Freq: Every day | SUBCUTANEOUS | 1 refills | Status: DC

## 2018-10-16 MED ORDER — ALTEPLASE 2 MG IJ SOLR: 2.0000 mg | Freq: Once | INTRAMUSCULAR | Status: DC | PRN

## 2018-10-16 MED ORDER — LIDOCAINE HCL (PF) 1 % IJ SOLN: 5.0000 mL | INTRAMUSCULAR | Status: DC | PRN

## 2018-10-16 MED ORDER — APIXABAN 5 MG PO TABS: 10.0000 mg | ORAL_TABLET | Freq: Two times a day (BID) | ORAL | 0 refills | Status: DC

## 2018-10-16 MED ORDER — LISINOPRIL 10 MG PO TABS: 10.0000 mg | ORAL_TABLET | Freq: Every day | ORAL | 0 refills | Status: DC

## 2018-10-16 MED ORDER — APIXABAN 5 MG PO TABS: 5.0000 mg | ORAL_TABLET | Freq: Two times a day (BID) | ORAL | 0 refills | Status: DC

## 2018-10-16 NOTE — Progress Notes (Signed)
Physical Therapy Treatment Patient Details Name: Amber Lara MRN: [id number] DOB: June 09, 1961 Today's Date: 10/16/2018    History of Present Illness Patient is a 58 y/o female admitted 10/09/18 with hyperglycemia, leg pain and chest pain. Found to have acute DVT RLE. HD initiated this admission. PMH includes psychosis, seizures, HTN, stroke, CKD V.    PT Comments    Pt progressing towards goals. Practiced gait and dynamic gait tasks without use of AD and pt with unsteadiness requiring min to min guard A for balance. Pt easily distracted and presents with difficulty dual tasking. Practiced static standing balance tasks with narrow BOS and semi-tandem with eyes open and closed and pt with increased unsteadiness requiring min guard to min A to maintain balance. Current recommendations appropriate as pt a fall risk. Will continue to follow acutely to maximize functional mobility independence and safety.   Follow Up Recommendations  SNF;Supervision for mobility/OOB     Equipment Recommendations  None recommended by PT    Recommendations for Other Services       Precautions / Restrictions Precautions Precautions: Fall Restrictions Weight Bearing Restrictions: No    Mobility  Bed Mobility               General bed mobility comments: Received sitting EOB  Transfers Overall transfer level: Needs assistance Equipment used: None Transfers: Sit to/from Stand Sit to Stand: Min guard         General transfer comment: Close min guard for safety.   Ambulation/Gait Ambulation/Gait assistance: Min guard;Min assist Gait Distance (Feet): 200 Feet Assistive device: None Gait Pattern/deviations: Step-to pattern;Shuffle;Trunk flexed;Drifts right/left Gait velocity: Decreased    General Gait Details: Slow, unsteady gait. Shuffle type gait as well, which caused unsteadiness. Cues to increased step height and length; pt would follow for short time and then returned to shuffle gait.  Performed horizontal and vertical head turns during ambulation and pt unsteady requiring min A. Pt also with difficulty dual tasking during gait.    Stairs             Wheelchair Mobility    Modified Rankin (Stroke Patients Only)       Balance Overall balance assessment: Needs assistance Sitting-balance support: Feet supported;No upper extremity supported Sitting balance-Leahy Scale: Good     Standing balance support: During functional activity Standing balance-Leahy Scale: Fair                 High Level Balance Comments: Performed static standing tasks with narrow BOS (20 sec) and semi-tandem stance (20 secs), and then performed both with eyes closed. Pt with increased sway and with LOB when performing tasks with eyes closed. Pt unaware of deficits.             Cognition Arousal/Alertness: Awake/alert Behavior During Therapy: WFL for tasks assessed/performed Overall Cognitive Status: No family/caregiver present to determine baseline cognitive functioning Area of Impairment: Attention;Safety/judgement;Following commands;Problem solving                   Current Attention Level: Selective   Following Commands: Follows one step commands inconsistently;Follows one step commands with increased time Safety/Judgement: Decreased awareness of deficits   Problem Solving: Slow processing General Comments: Pt very easily distracted and required cues to turn around to return to room ~5 times before completing. Pt would go off on tangents as well. Unaware of balance deficits.       Exercises      General Comments        Pertinent  Vitals/Pain Pain Assessment: Faces Faces Pain Scale: Hurts little more Pain Location: R knee Pain Descriptors / Indicators: Sore;Discomfort Pain Intervention(s): Limited activity within patient's tolerance;Monitored during session;Repositioned    Home Living                      Prior Function            PT  Goals (current goals can now be found in the care plan section) Acute Rehab PT Goals Patient Stated Goal: to find somewhere to live by myself PT Goal Formulation: With patient Time For Goal Achievement: 10/22/18 Potential to Achieve Goals: Good Progress towards PT goals: Progressing toward goals    Frequency    Min 2X/week      PT Plan Current plan remains appropriate;Discharge plan needs to be updated    Co-evaluation              AM-PAC PT "6 Clicks" Mobility   Outcome Measure  Help needed turning from your back to your side while in a flat bed without using bedrails?: None Help needed moving from lying on your back to sitting on the side of a flat bed without using bedrails?: None Help needed moving to and from a bed to a chair (including a wheelchair)?: A Little Help needed standing up from a chair using your arms (e.g., wheelchair or bedside chair)?: A Little Help needed to walk in hospital room?: A Little Help needed climbing 3-5 steps with a railing? : A Lot 6 Click Score: 19    End of Session Equipment Utilized During Treatment: Gait belt Activity Tolerance: Patient tolerated treatment well Patient left: in bed;with call bell/phone within reach Nurse Communication: Mobility status PT Visit Diagnosis: Unsteadiness on feet (R26.81);Difficulty in walking, not elsewhere classified (R26.2)     Time: 0943-1000 PT Time Calculation (min) (ACUTE ONLY): 17 min  Charges:  $Gait Training: 8-22 mins                     Leighton Ruff, PT, DPT  Acute Rehabilitation Services  Pager: [phone] Office: [phone]    Rudean Hitt 10/16/2018, 10:07 AM

## 2018-10-16 NOTE — Clinical Social Work Note (Signed)
CSW facilitated patient discharge including contacting facility to confirm patient discharge plans. Clinical information faxed to facility and family agreeable with plan. CSW arranged ambulance transport via [place] to [facility]. RN to call report prior to discharge ([place], Room 103).  CSW will sign off for now as social work intervention is no longer needed. Please consult [place] again if new needs arise.  Dayton Scrape, [place]

## 2018-10-16 NOTE — Discharge Summary (Signed)
Physician Discharge Summary  Amber Lara FWY:[id number] DOB: 04-08-1961 DOA: 10/05/2018  PCP: Patient, No Pcp Per  Admit date: 10/05/2018 Discharge date: 10/16/2018  Admitted From: Home Disposition:  Home  Discharge Condition:Stable CODE STATUS:FULL Diet recommendation:renal  Brief/Interim Summary: Patient is a 58 year old female with past medical history of CKD stage V, psychosis who presented initially with complaints of chest pain and right lower extremity pain.  She was found to have acute right lower extremity DVT.  She was also found to have worsening renal function.  CKD progressed to ESRD .  Nephrology consulted and she was started on hemodialysis.   Physical therapy recommended skilled nursing facility on discharge.    Outpatient dialysis has been finalized.  She will be dialyzed on Tuesday, Thursday and Saturday at [facility]. Patient is hemodynamically stable for discharge today.  Following problems were addressed during her hospitalization:  Acute right femoral DVT: VQ scan was normal.  Started on Eliquis.  She will continue on Eliquis 10 mg twice daily till 10/17/2018.  From 10/18/2018 she is to continue on Eliquis 5 mg twice a day.  CKD stage V: Progressed to ESRD.  Now on dialysis.  Started on 12/30.  Underwent AV graft placement on right arm by IR on 12/31.   CLIP done.  Hypertension: Currently blood pressure stable.  Continue current medications  Diabetes type 2: Hemoglobin A1c of 11.8 in August.  Noncompliant with insulin as an outpatient.  Continue current insulin orders.  Anemia of CKD: Currently stable  History of psychosis/paranoia/delusional thoughts: Currently mentation stable.  Continue Cogentin  Seizure disorder: Continue Keppra  Left flank pain: CT renal study nonspecific, suggestion of ureteritis, however patient not symptomatic, no role for antibiotics per Dr. [name]  Deconditioning/debility: She has insurance approval for  discharge to [facility] skilled nursing facility.       Discharge Diagnoses:  Principal Problem:   Acute renal failure (ARF) (HCC) Active Problems:   Type 2 diabetes mellitus with hyperglycemia (HCC)   Normochromic normocytic anemia   Seizure (HCC)   CKD (chronic kidney disease), stage V (HCC)   Acute deep vein thrombosis (DVT) of femoral vein of right lower extremity (HCC)   ESRD (end stage renal disease) ([place])    Discharge Instructions  Discharge Instructions    Diet - low sodium heart healthy   Complete by:  As directed    Discharge instructions   Complete by:  As directed    1) Take prescribed medications as instructed. 2)Follow up with outpatient hemodialysis and nephrology.   Increase activity slowly   Complete by:  As directed      Allergies as of 10/16/2018      Reactions   Penicillins Anaphylaxis, Hives   Has patient had a PCN reaction causing immediate rash, facial/tongue/throat swelling, SOB or lightheadedness with hypotension: yes Has patient had a PCN reaction causing severe rash involving mucus membranes or skin n24} If all of the above answers are "NO", then may proceed with Cephalosporin use.ecrosis: no Has patient had a PCN reaction that required hospitalization: no Has patient had a PCN reaction occurring within the last 10 years:no   Tetracyclines & Related Anaphylaxis, Hives   Morphine And Related Hives   Amoxicillin    Same as penicillin   Latex Itching, Rash   Tape Itching, Rash      Medication List    STOP taking these medications   furosemide 20 MG tablet Commonly known as:  LASIX     TAKE these  medications   acetaminophen 325 MG tablet Commonly known as:  TYLENOL Take 2 tablets (650 mg total) by mouth every 6 (six) hours as needed for mild pain or moderate pain (or Fever >/= 101).   amLODipine 10 MG tablet Commonly known as:  NORVASC Take 1 tablet (10 mg total) by mouth daily.   apixaban 5 MG Tabs tablet Commonly known as:   ELIQUIS Take 2 tablets (10 mg total) by mouth 2 (two) times daily for 1 day.   apixaban 5 MG Tabs tablet Commonly known as:  ELIQUIS Take 1 tablet (5 mg total) by mouth 2 (two) times daily. Start taking on:  October 18, 2018   atorvastatin 40 MG tablet Commonly known as:  LIPITOR Take 1 tablet (40 mg total) by mouth daily at 6 PM.   benztropine 2 MG tablet Commonly known as:  COGENTIN Take 2 mg by mouth 2 (two) times daily.   blood glucose meter kit and supplies Kit Dispense based on patient and insurance preference. Use up to four times daily as directed. (FOR ICD-9 250.00, 250.01).   Ferrous Fumarate 324 (106 Fe) MG Tabs tablet Commonly known as:  HEMOCYTE - 106 mg FE Take 1 tablet (106 mg of iron total) by mouth daily.   gabapentin 100 MG capsule Commonly known as:  NEURONTIN Take 1 capsule (100 mg total) by mouth 2 (two) times daily.   insulin aspart 100 UNIT/ML injection Commonly known as:  NOVOLOG Inject 3 times daily based on sliding scale: 125=0 units 126-180=1 unit, 181-200=2 units 201-250 administer= 4 units 251-300 administer =6 units 301 or more 8 units-call provider   insulin glargine 100 UNIT/ML injection Commonly known as:  LANTUS Inject 0.3 mLs (30 Units total) into the skin at bedtime. What changed:  how much to take   Insulin Pen Needle 30G X 8 MM Misc Commonly known as:  NOVOFINE Inject insulin into skin per units prescribed.   Insulin Syringe-Needle U-100 31G X 5/16" 0.3 ML Misc Commonly known as:  TRUEPLUS INSULIN SYRINGE Use as directed   levETIRAcetam 500 MG tablet Commonly known as:  KEPPRA Take 1 tablet (500 mg total) by mouth 2 (two) times daily.   lisinopril 10 MG tablet Commonly known as:  PRINIVIL,ZESTRIL Take 1 tablet (10 mg total) by mouth daily.   metoprolol tartrate 50 MG tablet Commonly known as:  LOPRESSOR Take 1 tablet (50 mg total) by mouth 2 (two) times daily.   multivitamin with minerals Tabs tablet Take 1 tablet by mouth  daily.   promethazine 25 MG tablet Commonly known as:  PHENERGAN Take 1 tablet (25 mg total) by mouth every 6 (six) hours as needed for nausea or vomiting.      Contact information for after-discharge care    [facility] .   Service:  Skilled Nursing Contact information: [place] [address] [phone]             Allergies  Allergen Reactions  . Penicillins Anaphylaxis and Hives    Has patient had a PCN reaction causing immediate rash, facial/tongue/throat swelling, SOB or lightheadedness with hypotension: yes Has patient had a PCN reaction causing severe rash involving mucus membranes or skin n24} If all of the above answers are "NO", then may proceed with Cephalosporin use.ecrosis: no Has patient had a PCN reaction that required hospitalization: no Has patient had a PCN reaction occurring within the last 10 years:no  . Tetracyclines & Related Anaphylaxis  and Hives  . Morphine And Related Hives  . Amoxicillin     Same as penicillin  . Latex Itching and Rash  . Tape Itching and Rash    Consultations:  Nephrology   Procedures/Studies: Dg Chest 2 View  Result Date: 10/05/2018 CLINICAL DATA:  Chest pain EXAM: CHEST - 2 VIEW COMPARISON:  08/14/2018 FINDINGS: The heart size and mediastinal contours are within normal limits. Both lungs are clear. The visualized skeletal structures are unremarkable. IMPRESSION: No active cardiopulmonary disease. Electronically Signed   By: Ashley Royalty M.D.   On: 10/05/2018 18:24   Nm Pulmonary Vent And Perf (v/q Scan)  Result Date: 10/06/2018 CLINICAL DATA:  58 year old admitted yesterday for chest pain and RIGHT LOWER extremity pain and swelling. Chronic renal insufficiency precluded IV contrast for CTA chest. EXAM: NUCLEAR MEDICINE VENTILATION - PERFUSION LUNG SCAN TECHNIQUE: Ventilation images were obtained in multiple projections using inhaled aerosol Tc-58mDTPA. Perfusion  images were obtained in multiple projections after intravenous injection of Tc-939mAA. RADIOPHARMACEUTICALS:  31.1 mCi of Tc-9954mPA aerosol inhalation and 4.2 mCi Tc99m61m IV COMPARISON:  No prior nuclear imaging. Chest x-ray yesterday is correlated. FINDINGS: Ventilation: No focal ventilation defect. Aerosol deposition in the trachea is noted. Perfusion: Normal pulmonary perfusion. No perfusion defects in either lung. IMPRESSION: Normal examination. Electronically Signed   By: ThomEvangeline Dakin.   On: 10/06/2018 10:13   Ir Us GKoreade Vasc Access Right  Result Date: 10/09/2018 INDICATION: 57 y83r old with end-stage renal disease and starting hemodialysis. Patient has a right arm brachiocephalic fistula that was used for the first time but there were problems with pulling clots. Plan for right upper extremity fistulogram. EXAM: RIGHT UPPER EXTREMITY FISTULOGRAM ULTRASOUND GUIDANCE FOR VASCULAR ACCESS MEDICATIONS: None ANESTHESIA/SEDATION: None FLUOROSCOPY TIME:  Fluoroscopy Time: 24 seconds, 12 mGy CONTRAST:  30 mL [id number]: None immediate. PROCEDURE: Informed written consent was obtained from the patient after a thorough discussion of the procedural risks, benefits and alternatives. All questions were addressed. A timeout was performed prior to the initiation of the procedure. Ultrasound confirmed a patent right upper arm fistula. The arm was prepped with chlorhexidine. Angiocath was directed into the cephalic vein with ultrasound guidance. Fistulogram images were obtained. Catheter was removed with manual compression. FINDINGS: Right upper arm brachiocephalic fistula is widely patent. Central veins are patent. Arterial anastomosis is widely patent. There is a linear structure in the cephalic vein just beyond the arterial anastomosis. This linear structure could represent a valve or postoperative change. This finding was not flow limiting. IMPRESSION: Right upper arm brachiocephalic fistula  is widely patent without stenosis. Fistula is ready for use. ACCESS: This access remains amenable to future percutaneous interventions as clinically indicated. Electronically Signed   By: AdamMarkus Daft.   On: 10/09/2018 16:59   Ir Dialy Shunt Intro Needle/intracath Initial W/img Right  Result Date: 10/09/2018 INDICATION: 57 y71r old with end-stage renal disease and starting hemodialysis. Patient has a right arm brachiocephalic fistula that was used for the first time but there were problems with pulling clots. Plan for right upper extremity fistulogram. EXAM: RIGHT UPPER EXTREMITY FISTULOGRAM ULTRASOUND GUIDANCE FOR VASCULAR ACCESS MEDICATIONS: None ANESTHESIA/SEDATION: None FLUOROSCOPY TIME:  Fluoroscopy Time: 24 seconds, 12 mGy CONTRAST:  30 mL [id number]: None immediate. PROCEDURE: Informed written consent was obtained from the patient after a thorough discussion of the procedural risks, benefits and alternatives. All questions were addressed. A timeout was performed prior to the initiation of the procedure. Ultrasound confirmed a patent right upper  arm fistula. The arm was prepped with chlorhexidine. Angiocath was directed into the cephalic vein with ultrasound guidance. Fistulogram images were obtained. Catheter was removed with manual compression. FINDINGS: Right upper arm brachiocephalic fistula is widely patent. Central veins are patent. Arterial anastomosis is widely patent. There is a linear structure in the cephalic vein just beyond the arterial anastomosis. This linear structure could represent a valve or postoperative change. This finding was not flow limiting. IMPRESSION: Right upper arm brachiocephalic fistula is widely patent without stenosis. Fistula is ready for use. ACCESS: This access remains amenable to future percutaneous interventions as clinically indicated. Electronically Signed   By: Markus Daft M.D.   On: 10/09/2018 16:59   Ct Renal Stone Study  Result Date:  10/06/2018 CLINICAL DATA:  Acute onset of flank pain. EXAM: CT ABDOMEN AND PELVIS WITHOUT CONTRAST TECHNIQUE: Multidetector CT imaging of the abdomen and pelvis was performed following the standard protocol without IV contrast. COMPARISON:  CT of the abdomen and pelvis from 07/15/2018 FINDINGS: Lower chest: Mild bibasilar atelectasis is noted. The visualized portions of the mediastinum are unremarkable. Hepatobiliary: The liver is unremarkable in appearance. The gallbladder is unremarkable in appearance. The common bile duct remains normal in caliber. Pancreas: The pancreas is within normal limits. Spleen: The spleen is unremarkable in appearance. Adrenals/Urinary Tract: The adrenal glands are unremarkable in appearance. No significant hydronephrosis is seen. There is suggestion of mild wall thickening at the left renal pelvis, which could reflect mild ureteritis. Mild nonspecific perinephric stranding is noted bilaterally. No renal or ureteral stones are identified. Stomach/Bowel: The stomach is unremarkable in appearance. The small bowel is within normal limits. The appendix is normal in caliber, without evidence of appendicitis. The colon is unremarkable in appearance. Vascular/Lymphatic: The abdominal aorta is unremarkable in appearance. The inferior vena cava is grossly unremarkable. No retroperitoneal lymphadenopathy is seen. No pelvic sidewall lymphadenopathy is identified. Reproductive: The bladder is mildly distended and grossly unremarkable. The patient is status post hysterectomy. No suspicious adnexal masses are seen. Other: No additional soft tissue abnormalities are seen. Musculoskeletal: No acute osseous abnormalities are identified. The visualized musculature is unremarkable in appearance. IMPRESSION: 1. Suggestion of mild wall thickening at the left renal pelvis, which could reflect mild ureteritis. No evidence of hydronephrosis. 2. Mild bibasilar atelectasis noted. Electronically Signed   By:  Garald Balding M.D.   On: 10/06/2018 03:46   Vas [place] Lower Extremity Venous (dvt) (mc And Wl 7a-7p)  Result Date: 10/07/2018  Lower Venous Study Indications: Pain, Swelling, and Edema.  Limitations: Body habitus and severe pain. Comparison Study: No prior study available Performing Technologist: Lorina Rabon  Examination Guidelines: A complete evaluation includes B-mode imaging, spectral Doppler, color Doppler, and power Doppler as needed of all accessible portions of each vessel. Bilateral testing is considered an integral part of a complete examination. Limited examinations for reoccurring indications may be performed as noted.  Right Venous Findings: +---------+---------------+---------+-----------+---------------+--------------+          CompressibilityPhasicitySpontaneityProperties     Summary        +---------+---------------+---------+-----------+---------------+--------------+ CFV      Full           Yes      Yes                                      +---------+---------------+---------+-----------+---------------+--------------+ SFJ      Full                                                             +---------+---------------+---------+-----------+---------------+--------------+  FV Prox  Full                                                             +---------+---------------+---------+-----------+---------------+--------------+ FV Mid   Partial                 Yes        partially      Acute                                                      re-cannalized                 +---------+---------------+---------+-----------+---------------+--------------+ FV DistalPartial                 Yes        partially      Acute                                                      re-cannalized                 +---------+---------------+---------+-----------+---------------+--------------+ PFV      Full                                                              +---------+---------------+---------+-----------+---------------+--------------+ POP      Full           Yes      Yes                                      +---------+---------------+---------+-----------+---------------+--------------+ PTV                              Yes                       poor                                                                      visibility                                                                with  compression    +---------+---------------+---------+-----------+---------------+--------------+ PERO                                                       not well                                                                  visualized     +---------+---------------+---------+-----------+---------------+--------------+  Left Venous Findings: +---+---------------+---------+-----------+----------+-------+    CompressibilityPhasicitySpontaneityPropertiesSummary +---+---------------+---------+-----------+----------+-------+ CFVFull           Yes      Yes                          +---+---------------+---------+-----------+----------+-------+    Summary: Right: Findings consistent with acute deep vein thrombosis involving the right femoral vein. No cystic structure found in the popliteal fossa. Posterior tibial and peroneal veins with very limited visibility when compress, study cannot completely exclude  the possible existence of thrombosis at calf veins even though flow detected. Left: No evidence of common femoral vein obstruction.  *See table(s) above for measurements and observations. Electronically signed by Curt Jews MD on 10/07/2018 at 9:06:12 AM.    Final        Subjective: Patient seen and examined the bedside this morning.  Remains comfortable.  Hemodynamically stable for discharge today.  Discharge Exam: Vitals:    10/15/18 2015 10/16/18 0537  BP: (!) 104/57 (!) 132/57  Pulse: 64 64  Resp: 18   Temp: 97.7 F (36.5 C) 98.2 F (36.8 C)  SpO2: 99% 100%   Vitals:   10/15/18 1200 10/15/18 1205 10/15/18 2015 10/16/18 0537  BP: 132/60 (!) 124/55 (!) 104/57 (!) 132/57  Pulse: 60 63 64 64  Resp:  18 18   Temp:  97.8 F (36.6 C) 97.7 F (36.5 C) 98.2 F (36.8 C)  TempSrc:  Oral Oral Oral  SpO2:   99% 100%  Weight:  99.2 kg  100.2 kg  Height:        General: Pt is alert, awake, not in acute distress Cardiovascular: RRR, S1/S2 +, no rubs, no gallops Respiratory: CTA bilaterally, no wheezing, no rhonchi Abdominal: Soft, NT, ND, bowel sounds + Extremities: no edema, no cyanosis, AV graft on the right arm.    The results of significant diagnostics from this hospitalization (including imaging, microbiology, ancillary and laboratory) are listed below for reference.     Microbiology: No results found for this or any previous visit (from the past 240 hour(s)).   Labs: BNP (last 3 results) Recent Labs    06/02/18 0723  BNP 9.5   Basic Metabolic Panel: Recent Labs  Lab 10/10/18 0515 10/11/18 1103 10/13/18 1339 10/15/18 0546  NA 135 136 131* 131*  K 4.2 3.7 4.4 4.1  CL 107 105 101 100  CO2 18* 21* 21* 23  GLUCOSE 191* 92 170* 184*  BUN 81* 42* 45* 36*  CREATININE 7.27* 4.86* 5.62* 5.49*  CALCIUM 8.7* 8.3* 8.4* 8.3*  PHOS  --  3.5 3.9  --    Liver Function Tests: Recent Labs  Lab 10/11/18 1103 10/13/18  1339  ALBUMIN 2.3* 2.1*   No results for input(s): LIPASE, AMYLASE in the last 168 hours. No results for input(s): AMMONIA in the last 168 hours. CBC: Recent Labs  Lab 10/10/18 0515 10/11/18 0413 10/12/18 0532 10/15/18 0800  WBC 7.7 6.1 7.7 6.3  HGB 9.2* 8.6* 8.0* 8.2*  HCT 28.6* 26.4* 25.0* 26.1*  MCV 80.1 81.0 82.2 84.2  PLT 288 250 238 273   Cardiac Enzymes: No results for input(s): CKTOTAL, CKMB, CKMBINDEX, TROPONINI in the last 168 hours. BNP: Invalid  input(s): POCBNP CBG: Recent Labs  Lab 10/14/18 2115 10/15/18 1315 10/15/18 1638 10/15/18 2142 10/16/18 0730  GLUCAP 240* 136* 353* 176* 146*   D-Dimer No results for input(s): DDIMER in the last 72 hours. Hgb A1c No results for input(s): HGBA1C in the last 72 hours. Lipid Profile No results for input(s): CHOL, HDL, LDLCALC, TRIG, CHOLHDL, LDLDIRECT in the last 72 hours. Thyroid function studies No results for input(s): TSH, T4TOTAL, T3FREE, THYROIDAB in the last 72 hours.  Invalid input(s): FREET3 Anemia work up No results for input(s): VITAMINB12, FOLATE, FERRITIN, TIBC, IRON, RETICCTPCT in the last 72 hours. Urinalysis    Component Value Date/Time   COLORURINE STRAW (A) 10/06/2018 1330   APPEARANCEUR CLEAR 10/06/2018 1330   LABSPEC 1.010 10/06/2018 1330   PHURINE 5.0 10/06/2018 1330   GLUCOSEU >=500 (A) 10/06/2018 1330   HGBUR SMALL (A) 10/06/2018 1330   BILIRUBINUR NEGATIVE 10/06/2018 1330   KETONESUR NEGATIVE 10/06/2018 1330   PROTEINUR >=300 (A) 10/06/2018 1330   UROBILINOGEN 0.2 08/07/2017 0952   NITRITE NEGATIVE 10/06/2018 1330   LEUKOCYTESUR NEGATIVE 10/06/2018 1330   Sepsis Labs Invalid input(s): PROCALCITONIN,  WBC,  LACTICIDVEN Microbiology No results found for this or any previous visit (from the past 240 hour(s)).  Please note: You were cared for by a hospitalist during your hospital stay. Once you are discharged, your primary care physician will handle any further medical issues. Please note that NO REFILLS for any discharge medications will be authorized once you are discharged, as it is imperative that you return to your primary care physician (or establish a relationship with a primary care physician if you do not have one) for your post hospital discharge needs so that they can reassess your need for medications and monitor your lab values.    Time coordinating discharge: 40 minutes  SIGNED:   Shelly Coss, MD  [facility] 10/16/2018,  8:09 AM Pager [phone]  If 7PM-7AM, please contact night-coverage [email or web address] Password [email or web address]

## 2018-10-16 NOTE — Care Management Important Message (Signed)
Important Message  Patient Details  Name: Amber Lara MRN: [id number] Date of Birth: 13-Jan-1961   Medicare Important Message Given:  Yes    Macee Venables P Butner 10/16/2018, 2:02 PM

## 2018-10-16 NOTE — Clinical Social Work Placement (Signed)
   CLINICAL SOCIAL WORK PLACEMENT  NOTE  Date:  10/16/2018  Patient Details  Name: Laketta Soderberg MRN: [id number] Date of Birth: 03-24-1961  Clinical Social Work is seeking post-discharge placement for this patient at the [place] level of care (*CSW will initial, date and re-position this form in  chart as items are completed):      Patient/family provided with [facility] Work Department's list of facilities offering this level of care within the geographic area requested by the patient (or if unable, by the patient's family).      Patient/family informed of their freedom to choose among providers that offer the needed level of care, that participate in Medicare, Medicaid or managed care program needed by the patient, have an available bed and are willing to accept the patient.      Patient/family informed of [facility]'s ownership interest in [facility] and [facility], as well as of the fact that they are under no obligation to receive care at these facilities.  PASRR submitted to EDS on 10/08/18     PASRR number received on       Existing PASRR number confirmed on 10/08/18     FL2 transmitted to all facilities in geographic area requested by pt/family on 10/08/18     FL2 transmitted to all facilities within larger geographic area on       Patient informed that his/her managed care company has contracts with or will negotiate with certain facilities, including the following:        Yes   Patient/family informed of bed offers received.  Patient chooses bed at [facility]     Physician recommends and patient chooses bed at      Patient to be transferred to [facility] on 10/16/18.  Patient to be transferred to facility by [facility]     Patient family notified on 10/16/18 of transfer.  Name of family member notified:        PHYSICIAN       Additional Comment:    _______________________________________________ Candie Chroman,  LCSW 10/16/2018, 10:53 AM

## 2018-10-16 NOTE — Progress Notes (Signed)
RN called report to SNF.  

## 2018-10-31 DIAGNOSIS — E1165 Type 2 diabetes mellitus with hyperglycemia: Secondary | ICD-10-CM | POA: Diagnosis not present

## 2018-10-31 DIAGNOSIS — I10 Essential (primary) hypertension: Secondary | ICD-10-CM | POA: Diagnosis not present

## 2018-11-05 DIAGNOSIS — I129 Hypertensive chronic kidney disease with stage 1 through stage 4 chronic kidney disease, or unspecified chronic kidney disease: Secondary | ICD-10-CM | POA: Diagnosis not present

## 2018-11-05 DIAGNOSIS — I10 Essential (primary) hypertension: Secondary | ICD-10-CM | POA: Diagnosis not present

## 2018-11-28 ENCOUNTER — Ambulatory Visit: Payer: Medicare Other | Admitting: Podiatry

## 2018-12-07 ENCOUNTER — Encounter: Payer: Self-pay | Admitting: Nephrology

## 2018-12-11 ENCOUNTER — Emergency Department (HOSPITAL_COMMUNITY): Payer: Medicare Other

## 2018-12-11 ENCOUNTER — Encounter (HOSPITAL_COMMUNITY): Payer: Self-pay | Admitting: Emergency Medicine

## 2018-12-11 ENCOUNTER — Other Ambulatory Visit: Payer: Self-pay

## 2018-12-11 ENCOUNTER — Emergency Department (HOSPITAL_COMMUNITY)
Admission: EM | Admit: 2018-12-11 | Discharge: 2018-12-11 | Payer: Medicare Other | Attending: Emergency Medicine | Admitting: Emergency Medicine

## 2018-12-11 DIAGNOSIS — Z79899 Other long term (current) drug therapy: Secondary | ICD-10-CM | POA: Insufficient documentation

## 2018-12-11 DIAGNOSIS — I120 Hypertensive chronic kidney disease with stage 5 chronic kidney disease or end stage renal disease: Secondary | ICD-10-CM | POA: Insufficient documentation

## 2018-12-11 DIAGNOSIS — R4182 Altered mental status, unspecified: Secondary | ICD-10-CM | POA: Diagnosis present

## 2018-12-11 DIAGNOSIS — Z7901 Long term (current) use of anticoagulants: Secondary | ICD-10-CM | POA: Insufficient documentation

## 2018-12-11 DIAGNOSIS — Z794 Long term (current) use of insulin: Secondary | ICD-10-CM | POA: Insufficient documentation

## 2018-12-11 DIAGNOSIS — E1122 Type 2 diabetes mellitus with diabetic chronic kidney disease: Secondary | ICD-10-CM | POA: Insufficient documentation

## 2018-12-11 DIAGNOSIS — N186 End stage renal disease: Secondary | ICD-10-CM | POA: Insufficient documentation

## 2018-12-11 DIAGNOSIS — Z91040 Latex allergy status: Secondary | ICD-10-CM | POA: Diagnosis not present

## 2018-12-11 DIAGNOSIS — F17200 Nicotine dependence, unspecified, uncomplicated: Secondary | ICD-10-CM | POA: Insufficient documentation

## 2018-12-11 LAB — COMPREHENSIVE METABOLIC PANEL
ALT: 15 U/L (ref 0–44)
AST: 18 U/L (ref 15–41)
Albumin: 3.4 g/dL — ABNORMAL LOW (ref 3.5–5.0)
Alkaline Phosphatase: 115 U/L (ref 38–126)
Anion gap: 13 (ref 5–15)
BUN: 51 mg/dL — ABNORMAL HIGH (ref 6–20)
CO2: 26 mmol/L (ref 22–32)
Calcium: 8.6 mg/dL — ABNORMAL LOW (ref 8.9–10.3)
Chloride: 95 mmol/L — ABNORMAL LOW (ref 98–111)
Creatinine, Ser: 5.97 mg/dL — ABNORMAL HIGH (ref 0.44–1.00)
GFR calc Af Amer: 8 mL/min — ABNORMAL LOW (ref >60)
GFR calc non Af Amer: 7 mL/min — ABNORMAL LOW (ref >60)
Glucose, Bld: 206 mg/dL — ABNORMAL HIGH (ref 70–99)
Potassium: 4.2 mmol/L (ref 3.5–5.1)
Sodium: 134 mmol/L — ABNORMAL LOW (ref 135–145)
Total Bilirubin: 0.6 mg/dL (ref 0.3–1.2)
Total Protein: 7.5 g/dL (ref 6.5–8.1)

## 2018-12-11 LAB — CBC WITH DIFFERENTIAL/PLATELET
Abs Immature Granulocytes: 0.03 10*3/uL (ref 0.00–0.07)
Basophils Absolute: 0 10*3/uL (ref 0.0–0.1)
Basophils Relative: 0 %
Eosinophils Absolute: 0.2 10*3/uL (ref 0.0–0.5)
Eosinophils Relative: 2 %
HEMATOCRIT: 27.5 % — AB (ref 36.0–46.0)
Hemoglobin: 8.7 g/dL — ABNORMAL LOW (ref 12.0–15.0)
Immature Granulocytes: 0 %
Lymphocytes Relative: 15 %
Lymphs Abs: 1.2 10*3/uL (ref 0.7–4.0)
MCH: 26.9 pg (ref 26.0–34.0)
MCHC: 31.6 g/dL (ref 30.0–36.0)
MCV: 85.1 fL (ref 80.0–100.0)
Monocytes Absolute: 0.6 10*3/uL (ref 0.1–1.0)
Monocytes Relative: 8 %
NEUTROS ABS: 6.2 10*3/uL (ref 1.7–7.7)
Neutrophils Relative %: 75 %
Platelets: 233 10*3/uL (ref 150–400)
RBC: 3.23 MIL/uL — ABNORMAL LOW (ref 3.87–5.11)
RDW: 15.1 % (ref 11.5–15.5)
WBC: 8.2 10*3/uL (ref 4.0–10.5)
nRBC: 0 % (ref 0.0–0.2)

## 2018-12-11 LAB — CBG MONITORING, ED: Glucose-Capillary: 229 mg/dL — ABNORMAL HIGH (ref 70–99)

## 2018-12-11 NOTE — ED Notes (Signed)
CBG [id number] 

## 2018-12-11 NOTE — ED Notes (Signed)
Patient verbalizes understanding of discharge instructions. Opportunity for questioning and answers were provided. Armband removed by staff, pt discharged from ED via stretcher to home with [facility].

## 2018-12-11 NOTE — ED Provider Notes (Signed)
[facility]ERGENC[facility] DEPARTMENT Provider Note   CSN: [id number] Arrival date & time: 12/11/18  1736    History   Chief Complaint Chief Complaint  Patient presents with  . Altered Mental Status    HPI Amber Lara is a 58 y.o. female with a past medical history of hypertension, ESRD on dialysis [facility], seizures, psychosis who presents to ED for evaluation of altered mental status.  Patient went to her regularly scheduled dialysis session today but only completed 2 hours of this.  She was then given a fluid bolus which is typical for her.  They noted that she was altered which is similar to her baseline.  As she was going through dialysis they claimed that she was more altered.  They then sent her to the ED.  Patient states that she did not go to dialysis today.  She has been otherwise compliant with dialysis.  EMS actually picked her up from the dialysis center.  She has had chronic RUE, RLE numbness since her prior CVA.  She denies any pain at this time.  Does note intermittent cough.  Denies any vomiting, diarrhea, fevers, URI symptoms.     HPI  Past Medical History:  Diagnosis Date  . Arthritis   . Chronic anemia   . CKD (chronic kidney disease), stage V ([place])   . Diabetes mellitus ([place])   . History of stroke   . Hypertension   . Obesity   . Psychosis ([place])    a. prior adm in [place] ([place]) in 2015 where pt was admitted with hallucinations, seeing men in black outfits, family members on a non-existent camera, hearing voices and seeing animals - UDS positive for methamphetamines at that time and patient stated she believed someone had put this in her body.  . Seizures [facility])     Patient Active Problem List   Diagnosis Date Noted  . Acute renal failure (ARF) ([place]) 10/09/2018  . Acute deep vein thrombosis (DVT) of femoral vein of right lower extremity ([place]) 10/05/2018  . ESRD (end stage renal disease) ([place])   . CKD (chronic kidney disease),  stage V ([place]) 06/04/2018  . Acute on chronic renal failure ([place]) 08/03/2017  . Seizure ([place]) 08/03/2017  . Hyperkalemia 07/14/2017  . Acute kidney injury superimposed on chronic kidney disease ([place]) 07/14/2017  . Type 2 diabetes mellitus with hyperglycemia ([place]) 06/23/2017  . Normochromic normocytic anemia 06/23/2017  . Essential hypertension 06/23/2017    Past Surgical History:  Procedure Laterality Date  . ABDOMINAL HYSTERECTOMY    . AV FISTULA PLACEMENT Right 06/07/2018   Procedure: CREATION OF RIGHT BRACHIOCEPHALIC ARTERIOVENOUS FISTULA;  Surgeon: Serafina Mitchell, MD;  Location: [facility];  Service: Vascular;  Laterality: Right;  . IR DIALY SHUNT INTRO [place] W/IMG RIGHT Right 10/09/2018  . IR US GUIDE VASC ACCESS RIGHT  10/09/2018     OB History   No obstetric history on file.      Home Medications    Prior to Admission medications   Medication Sig Start Date End Date Taking? Authorizing Provider  acetaminophen (TYLENOL) 325 MG tablet Take 2 tablets (650 mg total) by mouth every 6 (six) hours as needed for mild pain or moderate pain (or Fever >/= 101). 07/15/17   Hongal[place], Lenis Dickinson, MD  amLODipine (NORVASC) 10 MG tablet Take 1 tablet (10 mg total) by mouth daily. 07/23/18   Scot Jun, FNP  apixaban (ELIQUIS) 5 MG TABS tablet Take 2 tablets (10 mg  total) by mouth 2 (two) times daily for 1 day. 10/16/18 10/17/18  Shelly Coss, MD  apixaban (ELIQUIS) 5 MG TABS tablet Take 1 tablet (5 mg total) by mouth 2 (two) times daily. 10/18/18 12/11/18  Shelly Coss, MD  atorvastatin (LIPITOR) 40 MG tablet Take 1 tablet (40 mg total) by mouth daily at 6 PM. 07/19/18   Scot Jun, FNP  benztropine (COGENTIN) 2 MG tablet Take 2 mg by mouth 2 (two) times daily. 03/24/17   [provider]  blood glucose meter kit and supplies KIT Dispense based on patient and insurance preference. Use up to four times daily as directed. (FOR ICD-9 250.00, 250.01). 07/23/18    Scot Jun, FNP  Ferrous Fumarate (HEMOCYTE - 106 MG FE) 324 (106 Fe) MG TABS tablet Take 1 tablet (106 mg of iron total) by mouth daily. 07/25/18   Scot Jun, FNP  gabapentin (NEURONTIN) 100 MG capsule Take 1 capsule (100 mg total) by mouth 2 (two) times daily. 07/19/18   Scot Jun, FNP  insulin aspart (NOVOLOG) 100 UNIT/ML injection Inject 3 times daily based on sliding scale: 125=0 units 126-180=1 unit, 181-200=2 units 201-250 administer= 4 units 251-300 administer =6 units 301 or more 8 units-call provider 07/23/18   Scot Jun, FNP  insulin glargine (LANTUS) 100 UNIT/ML injection Inject 0.3 mLs (30 Units total) into the skin at bedtime. 10/16/18 02/13/19  Shelly Coss, MD  Insulin Pen Needle (NOVOFINE) 30G X 8 MM MISC Inject insulin into skin per units prescribed. 07/19/17   Scot Jun, FNP  Insulin Syringe-Needle U-100 (TRUEPLUS INSULIN SYRINGE) 31G X 5/16" 0.3 ML MISC Use as directed 07/19/18   Scot Jun, FNP  levETIRAcetam (KEPPRA) 500 MG tablet Take 1 tablet (500 mg total) by mouth 2 (two) times daily. 07/19/18 10/05/18  Scot Jun, FNP  lisinopril (PRINIVIL,ZESTRIL) 10 MG tablet Take 1 tablet (10 mg total) by mouth daily. 10/16/18   Shelly Coss, MD  metoprolol tartrate (LOPRESSOR) 50 MG tablet Take 1 tablet (50 mg total) by mouth 2 (two) times daily. 07/19/18 10/05/18  Scot Jun, FNP  Multiple Vitamin (MULTIVITAMIN WITH MINERALS) TABS tablet Take 1 tablet by mouth daily.    [provider]  promethazine (PHENERGAN) 25 MG tablet Take 1 tablet (25 mg total) by mouth every 6 (six) hours as needed for nausea or vomiting. 07/19/18   Scot Jun, FNP    Family History Family History  Problem Relation Age of Onset  . Diabetes Mellitus II Mother   . CAD Mother        First MI in her 73s, passed away before age 70  . Renal Disease Mother   . Diabetes Mellitus II Sister   . CAD Sister        Stents in her  early 23s  . Renal Disease Sister   . Diabetes Mellitus II Brother     Social History Social History   Tobacco Use  . Smoking status: Current Some Day Smoker  . Smokeless tobacco: Never Used  Substance Use Topics  . Alcohol use: No  . Drug use: No     Allergies   Penicillins; Tetracyclines & related; Morphine and related; Amoxicillin; Latex; and Tape   Review of Systems Review of Systems  Constitutional: Negative for appetite change, chills and fever.  HENT: Negative for ear pain, rhinorrhea, sneezing and sore throat.   Eyes: Negative for photophobia and visual disturbance.  Respiratory: Positive for cough. Negative for  chest tightness, shortness of breath and wheezing.   Cardiovascular: Negative for chest pain and palpitations.  Gastrointestinal: Negative for abdominal pain, blood in stool, constipation, diarrhea, nausea and vomiting.  Genitourinary: Negative for dysuria, hematuria and urgency.  Musculoskeletal: Negative for myalgias.  Skin: Negative for rash.  Neurological: Negative for dizziness, weakness and light-headedness.     Physical Exam Updated Vital Signs BP 122/63 (BP Location: Right Arm)   Pulse 71   Temp 97.7 F (36.5 C) (Oral)   Resp 12   Ht 5' 3"  (1.6 m)   SpO2 100%   BMI 39.15 kg/m   Physical Exam Vitals signs and nursing note reviewed.  Constitutional:      General: She is not in acute distress.    Appearance: She is well-developed.  HENT:     Head: Normocephalic and atraumatic.     Nose: Nose normal.  Eyes:     General: No scleral icterus.       Left eye: No discharge.     Conjunctiva/sclera: Conjunctivae normal.  Neck:     Musculoskeletal: Normal range of motion and neck supple.  Cardiovascular:     Rate and Rhythm: Normal rate and regular rhythm.     Heart sounds: Normal heart sounds. No murmur. No friction rub. No gallop.   Pulmonary:     Effort: Pulmonary effort is normal. No respiratory distress.     Breath sounds: Normal  breath sounds.  Abdominal:     General: Bowel sounds are normal. There is no distension.     Palpations: Abdomen is soft.     Tenderness: There is no abdominal tenderness. There is no guarding.  Musculoskeletal: Normal range of motion.     Right lower leg: No edema.     Left lower leg: No edema.  Skin:    General: Skin is warm and dry.     Findings: No rash.  Neurological:     Mental Status: She is alert.     Motor: No abnormal muscle tone.     Coordination: Coordination normal.      ED Treatments / Results  Labs (all labs ordered are listed, but only abnormal results are displayed) Labs Reviewed  COMPREHENSIVE METABOLIC PANEL - Abnormal; Notable for the following components:      Result Value   Sodium 134 (*)    Chloride 95 (*)    Glucose, Bld 206 (*)    BUN 51 (*)    Creatinine, Ser 5.97 (*)    Calcium 8.6 (*)    Albumin 3.4 (*)    GFR calc non Af Amer 7 (*)    GFR calc Af Amer 8 (*)    All other components within normal limits  CBC WITH DIFFERENTIAL/PLATELET - Abnormal; Notable for the following components:   RBC 3.23 (*)    Hemoglobin 8.7 (*)    HCT 27.5 (*)    All other components within normal limits  CBG MONITORING, ED - Abnormal; Notable for the following components:   Glucose-Capillary 229 (*)    All other components within normal limits  CBG MONITORING, ED    EKG EKG Interpretation  Date/Time:  Tuesday December 11 2018 17:43:53 EST Ventricular Rate:  71 PR Interval:    QRS Duration: 88 QT Interval:  435 QTC Calculation: 473 R Axis:   68 Text Interpretation:  Sinus or ectopic atrial rhythm Prolonged PR interval Probable left atrial enlargement No significant change since last tracing Confirmed by Isla Pence [phone]) on 12/11/2018 5:53:18  PM   Radiology Dg Chest 2 View  Result Date: 12/11/2018 CLINICAL DATA:  Altered mental status. EXAM: CHEST - 2 VIEW COMPARISON:  Radiograph 10/05/2018 FINDINGS: Lower lung volumes from prior exam leading to  bronchovascular crowding. Vascular congestion with mild bibasilar atelectasis. Unchanged heart size and mediastinal contours allowing for differences in technique. No pleural effusion, pneumothorax, or confluent airspace disease. No acute osseous abnormalities. IMPRESSION: Hypoventilatory chest with vascular congestion and bibasilar atelectasis. Electronically Signed   By: Keith Rake M.D.   On: 12/11/2018 19:49   Ct Head Wo Contrast  Result Date: 12/11/2018 CLINICAL DATA:  Altered mental status following dialysis. EXAM: CT HEAD WITHOUT CONTRAST TECHNIQUE: Contiguous axial images were obtained from the base of the skull through the vertex without intravenous contrast. COMPARISON:  07/11/2017. FINDINGS: Brain: Old left frontal, left parietotemporal and left parietooccipital infarcts, without significant change. Mildly progressive patchy white matter low density in the right frontal lobe period stable mild ex vacuo enlargement of the left lateral ventricle. Stable mild enlargement of the remainder of the ventricles and subarachnoid spaces. No intracranial hemorrhage, mass lesion or CT evidence of acute infarction. Vascular: No hyperdense vessel or unexpected calcification. Skull: Normal. Negative for fracture or focal lesion. Sinuses/Orbits: Unremarkable. Other: None. IMPRESSION: 1. No acute abnormality. 2. Stable old left frontal, left parietotemporal and left parietooccipital infarcts. 3. Mildly progressive chronic small vessel white matter ischemic changes or interval superimposed white matter infarct in the right frontal lobe. 4. Stable mild diffuse cerebral and cerebellar atrophy. Electronically Signed   By: Claudie Revering M.D.   On: 12/11/2018 19:28    Procedures Procedures (including critical care time)  Medications Ordered in ED Medications - No data to display   Initial Impression / Assessment and Plan / ED Course  I have reviewed the triage vital signs and the nursing notes.  Pertinent  labs & imaging results that were available during my care of the patient were reviewed by me and considered in my medical decision making (see chart for details).        58 year old female with a past medical history of ESRD on dialysis presents to ED for altered mental status.  EMS states that they were called from the dialysis center due to worsening altered mental status.  Staff states that she has baseline altered mental status but worsened which caused them to end dialysis session after 2 hours.  She was given a liter IV fluids with which they state is normal for her to get after her session.  Patient states that she never went to dialysis today.  However, she is alert and oriented x3.  She has some residual right-sided weakness from a prior stroke several years ago.  No new deficits noted.  She denies any pain currently. Vital signs are within normal limits.  Will obtain lab work, imaging and reassess.  CBC, CMP, CBG unremarkable.  EKG shows tracing similar to prior.  Chest x-ray shows chronic changes with no acute findings.  CT of the head is negative for acute abnormality.  Will have patient continue regularly scheduled dialysis sessions.  Suspect that the symptoms could be due to transient hypotension after dialysis as her symptoms and blood pressure did improve with 1 L of fluids.  We will have her follow-up with PCP and return to ED for any severe worsening symptoms. Patient discussed with and seen by my attending, Dr. Gilford Raid.  Patient is hemodynamically stable, in NAD, and able to ambulate in the ED. Evaluation does not  show pathology that would require ongoing emergent intervention or inpatient treatment. I explained the diagnosis to the patient. Pain has been managed and has no complaints prior to discharge. Patient is comfortable with above plan and is stable for discharge at this time. All questions were answered prior to disposition. Strict return precautions for returning to the ED  were discussed. Encouraged follow up with PCP.    Portions of this note were generated with [employer]. Dictation errors may occur despite best attempts at proofreading.   Final Clinical Impressions(s) / ED Diagnoses   Final diagnoses:  ESRD (end stage renal disease) [facility])    ED Discharge Orders    None       Delia Heady, PA-C 12/11/18 2010    Isla Pence, MD 12/11/18 2033

## 2018-12-11 NOTE — ED Notes (Signed)
Called [facility]

## 2018-12-11 NOTE — ED Notes (Signed)
Patient verbalizes understanding of discharge instructions. Opportunity for questioning and answers were provided. Armband removed by staff, pt discharged from ED via wheelchair to home.  

## 2018-12-11 NOTE — ED Triage Notes (Signed)
Pt BIB [facility] from dialysis, facility felt pt more altered than her normal. Pt found to be hypotensive, given 1L NS at dialysis, which is also normal for her. EMS BP 110/80, HR WNL. Pt slow to respond, but answering all questions appropriately.

## 2018-12-11 NOTE — ED Notes (Signed)
ED Provider at bedside. 

## 2018-12-11 NOTE — Discharge Instructions (Signed)
Return to the ED for worsening symptoms, abdominal pain or chest pain, vomiting or coughing up blood, lightheadedness or loss of consciousness.

## 2018-12-11 NOTE — ED Notes (Signed)
Patient transported to X-ray 

## 2018-12-18 ENCOUNTER — Emergency Department (HOSPITAL_COMMUNITY)
Admission: EM | Admit: 2018-12-18 | Discharge: 2018-12-18 | Disposition: A | Discharge status: Home / Self Care | Payer: Medicare Other | Attending: Emergency Medicine | Admitting: Emergency Medicine

## 2018-12-18 ENCOUNTER — Encounter (HOSPITAL_COMMUNITY): Payer: Self-pay

## 2018-12-18 ENCOUNTER — Non-Acute Institutional Stay (HOSPITAL_COMMUNITY)
Admission: EM | Admit: 2018-12-18 | Discharge: 2018-12-19 | Disposition: A | Discharge status: Rehabilitation Facility | Payer: Medicare Other | Source: Home / Self Care | Attending: Emergency Medicine | Admitting: Emergency Medicine

## 2018-12-18 ENCOUNTER — Emergency Department (HOSPITAL_COMMUNITY): Payer: Medicare Other

## 2018-12-18 ENCOUNTER — Encounter (HOSPITAL_COMMUNITY): Payer: Self-pay | Admitting: Emergency Medicine

## 2018-12-18 ENCOUNTER — Other Ambulatory Visit: Payer: Self-pay

## 2018-12-18 DIAGNOSIS — Z7901 Long term (current) use of anticoagulants: Secondary | ICD-10-CM | POA: Insufficient documentation

## 2018-12-18 DIAGNOSIS — Z79899 Other long term (current) drug therapy: Secondary | ICD-10-CM | POA: Insufficient documentation

## 2018-12-18 DIAGNOSIS — F17200 Nicotine dependence, unspecified, uncomplicated: Secondary | ICD-10-CM | POA: Insufficient documentation

## 2018-12-18 DIAGNOSIS — N186 End stage renal disease: Secondary | ICD-10-CM | POA: Diagnosis not present

## 2018-12-18 DIAGNOSIS — E1122 Type 2 diabetes mellitus with diabetic chronic kidney disease: Secondary | ICD-10-CM | POA: Diagnosis not present

## 2018-12-18 DIAGNOSIS — R410 Disorientation, unspecified: Secondary | ICD-10-CM | POA: Diagnosis not present

## 2018-12-18 DIAGNOSIS — M545 Low back pain: Secondary | ICD-10-CM | POA: Diagnosis not present

## 2018-12-18 DIAGNOSIS — R4182 Altered mental status, unspecified: Secondary | ICD-10-CM | POA: Diagnosis present

## 2018-12-18 DIAGNOSIS — T50905A Adverse effect of unspecified drugs, medicaments and biological substances, initial encounter: Secondary | ICD-10-CM | POA: Insufficient documentation

## 2018-12-18 DIAGNOSIS — Z91040 Latex allergy status: Secondary | ICD-10-CM | POA: Diagnosis not present

## 2018-12-18 DIAGNOSIS — M5489 Other dorsalgia: Secondary | ICD-10-CM | POA: Diagnosis not present

## 2018-12-18 DIAGNOSIS — Z8673 Personal history of transient ischemic attack (TIA), and cerebral infarction without residual deficits: Secondary | ICD-10-CM | POA: Diagnosis not present

## 2018-12-18 DIAGNOSIS — R400 Somnolence: Secondary | ICD-10-CM

## 2018-12-18 DIAGNOSIS — I120 Hypertensive chronic kidney disease with stage 5 chronic kidney disease or end stage renal disease: Secondary | ICD-10-CM | POA: Diagnosis not present

## 2018-12-18 DIAGNOSIS — Z794 Long term (current) use of insulin: Secondary | ICD-10-CM | POA: Insufficient documentation

## 2018-12-18 DIAGNOSIS — Z992 Dependence on renal dialysis: Secondary | ICD-10-CM | POA: Diagnosis not present

## 2018-12-18 DIAGNOSIS — R52 Pain, unspecified: Secondary | ICD-10-CM | POA: Diagnosis not present

## 2018-12-18 DIAGNOSIS — S0990XA Unspecified injury of head, initial encounter: Secondary | ICD-10-CM | POA: Diagnosis not present

## 2018-12-18 LAB — CBC WITH DIFFERENTIAL/PLATELET
Abs Immature Granulocytes: 0.02 10*3/uL (ref 0.00–0.07)
Basophils Absolute: 0 10*3/uL (ref 0.0–0.1)
Basophils Relative: 0 %
Eosinophils Absolute: 0.3 10*3/uL (ref 0.0–0.5)
Eosinophils Relative: 3 %
HCT: 23 % — ABNORMAL LOW (ref 36.0–46.0)
HEMOGLOBIN: 7.5 g/dL — AB (ref 12.0–15.0)
Immature Granulocytes: 0 %
Lymphocytes Relative: 28 %
Lymphs Abs: 2.1 10*3/uL (ref 0.7–4.0)
MCH: 27.1 pg (ref 26.0–34.0)
MCHC: 32.6 g/dL (ref 30.0–36.0)
MCV: 83 fL (ref 80.0–100.0)
Monocytes Absolute: 0.6 10*3/uL (ref 0.1–1.0)
Monocytes Relative: 9 %
Neutro Abs: 4.4 10*3/uL (ref 1.7–7.7)
Neutrophils Relative %: 60 %
Platelets: 269 10*3/uL (ref 150–400)
RBC: 2.77 MIL/uL — ABNORMAL LOW (ref 3.87–5.11)
RDW: 15 % (ref 11.5–15.5)
WBC: 7.4 10*3/uL (ref 4.0–10.5)
nRBC: 0 % (ref 0.0–0.2)

## 2018-12-18 LAB — COMPREHENSIVE METABOLIC PANEL
ALBUMIN: 3.3 g/dL — AB (ref 3.5–5.0)
ALK PHOS: 119 U/L (ref 38–126)
ALT: 15 U/L (ref 0–44)
AST: 18 U/L (ref 15–41)
Anion gap: 16 — ABNORMAL HIGH (ref 5–15)
BUN: 73 mg/dL — ABNORMAL HIGH (ref 6–20)
CO2: 19 mmol/L — ABNORMAL LOW (ref 22–32)
Calcium: 9 mg/dL (ref 8.9–10.3)
Chloride: 91 mmol/L — ABNORMAL LOW (ref 98–111)
Creatinine, Ser: 8.86 mg/dL — ABNORMAL HIGH (ref 0.44–1.00)
GFR calc Af Amer: 5 mL/min — ABNORMAL LOW (ref >60)
GFR calc non Af Amer: 5 mL/min — ABNORMAL LOW (ref >60)
Glucose, Bld: 111 mg/dL — ABNORMAL HIGH (ref 70–99)
Potassium: 4.8 mmol/L (ref 3.5–5.1)
Sodium: 126 mmol/L — ABNORMAL LOW (ref 135–145)
Total Bilirubin: 0.9 mg/dL (ref 0.3–1.2)
Total Protein: 6.6 g/dL (ref 6.5–8.1)

## 2018-12-18 LAB — POCT I-STAT EG7
ACID-BASE DEFICIT: 3 mmol/L — AB (ref 0.0–2.0)
Bicarbonate: 22.7 mmol/L (ref 20.0–28.0)
Calcium, Ion: 1.13 mmol/L — ABNORMAL LOW (ref 1.15–1.40)
HCT: 20 % — ABNORMAL LOW (ref 36.0–46.0)
Hemoglobin: 6.8 g/dL — CL (ref 12.0–15.0)
O2 Saturation: 97 %
Potassium: 4.8 mmol/L (ref 3.5–5.1)
Sodium: 120 mmol/L — ABNORMAL LOW (ref 135–145)
TCO2: 24 mmol/L (ref 22–32)
pCO2, Ven: 40.3 mmHg — ABNORMAL LOW (ref 44.0–60.0)
pH, Ven: 7.359 (ref 7.250–7.430)
pO2, Ven: 96 mmHg — ABNORMAL HIGH (ref 32.0–45.0)

## 2018-12-18 LAB — CBG MONITORING, ED: Glucose-Capillary: 129 mg/dL — ABNORMAL HIGH (ref 70–99)

## 2018-12-18 LAB — AMMONIA: Ammonia: 19 umol/L (ref 9–35)

## 2018-12-18 MED ORDER — SODIUM CHLORIDE 0.9 % IV SOLN: 100.0000 mL | INTRAVENOUS | Status: DC | PRN

## 2018-12-18 MED ORDER — CHLORHEXIDINE GLUCONATE CLOTH 2 % EX PADS: 6.0000 | MEDICATED_PAD | Freq: Every day | CUTANEOUS | Status: DC

## 2018-12-18 MED ORDER — HEPARIN SODIUM (PORCINE) 1000 UNIT/ML DIALYSIS
1000.0000 [IU] | INTRAMUSCULAR | Status: DC | PRN
  Filled 2018-12-18: qty 1

## 2018-12-18 MED ORDER — LIDOCAINE-PRILOCAINE 2.5-2.5 % EX CREA
1.0000 "application " | TOPICAL_CREAM | CUTANEOUS | Status: DC | PRN
  Filled 2018-12-18: qty 5

## 2018-12-18 MED ORDER — HEPARIN SODIUM (PORCINE) 1000 UNIT/ML DIALYSIS
1000.0000 [IU] | Freq: Once | INTRAMUSCULAR | Status: DC
  Filled 2018-12-18: qty 1

## 2018-12-18 MED ORDER — PENTAFLUOROPROP-TETRAFLUOROETH EX AERO: 1.0000 "application " | INHALATION_SPRAY | CUTANEOUS | Status: DC | PRN

## 2018-12-18 MED ORDER — LIDOCAINE HCL (PF) 1 % IJ SOLN: 5.0000 mL | INTRAMUSCULAR | Status: DC | PRN

## 2018-12-18 MED ORDER — HEPARIN SODIUM (PORCINE) 1000 UNIT/ML DIALYSIS: 1000.0000 [IU] | Freq: Once | INTRAMUSCULAR | Status: DC

## 2018-12-18 MED ORDER — SODIUM CHLORIDE 0.9 % IV SOLN
125.0000 mg | Freq: Once | INTRAVENOUS | Status: AC
  Administered 2018-12-18: 125 mg via INTRAVENOUS
  Filled 2018-12-18: qty 10

## 2018-12-18 MED ORDER — HEPARIN SODIUM (PORCINE) 1000 UNIT/ML DIALYSIS: 1000.0000 [IU] | INTRAMUSCULAR | Status: DC | PRN

## 2018-12-18 MED ORDER — PENTAFLUOROPROP-TETRAFLUOROETH EX AERO
1.0000 "application " | INHALATION_SPRAY | CUTANEOUS | Status: DC | PRN
  Filled 2018-12-18: qty 30

## 2018-12-18 MED ORDER — LIDOCAINE-PRILOCAINE 2.5-2.5 % EX CREA: 1.0000 "application " | TOPICAL_CREAM | CUTANEOUS | Status: DC | PRN

## 2018-12-18 MED ORDER — ALTEPLASE 2 MG IJ SOLR: 2.0000 mg | Freq: Once | INTRAMUSCULAR | Status: DC | PRN

## 2018-12-18 NOTE — ED Notes (Signed)
Pt CBG was 129, notified Mallory(RN)

## 2018-12-18 NOTE — ED Provider Notes (Signed)
Patient returned by [facility] after taking patient to her dialysis unit and they stated that because she had gone so long without having dialysis she needed to return to the hospital for dialysis.  Based on lab work and vital signs and evaluation patient did not meet criteria for emergent dialysis.  Will discuss with nephrology.  Patient is now more awake and alert.  She is still in no acute distress.  12:11 PM Spoke with nephrology who will come and see the pt.   Blanchie Dessert, MD 12/18/18 [phone]

## 2018-12-18 NOTE — ED Triage Notes (Signed)
Pt was just seen here and picked up by [facility] to be taken to dialysis. Dialysis refused to treat pt because they say pt need emergent dialysis.

## 2018-12-18 NOTE — Progress Notes (Signed)
Pt very confused while in HD, verbally abusive and combative. MD notified, states no prescription is needed to calm pt down, pt needs to complete full tx.  Tx progressing, security called. Will continue to monitor.

## 2018-12-18 NOTE — ED Triage Notes (Signed)
Pt arrives to ED from [facility] with complaints of multiple falls the past few days, sliding out of her wheelchair. EMS reports pt was on floor when they arrived, staff made no effort to place pt back in wheelchair or bed. Staff states pt is not her normal self, answering questions appropriately at this time with brief periods of confusion; pt does have psych history. Pt also has not been taken to dialysis in over a week. VSS. Pt placed in position of comfort with bed locked and lowered, call bell in reach.

## 2018-12-18 NOTE — ED Notes (Signed)
Pt able to stand and placed into wheelchair and taken to the bathroom.

## 2018-12-18 NOTE — ED Notes (Signed)
[facility] called @ 1024-per Megan, RN-called by Levada Dy

## 2018-12-18 NOTE — ED Notes (Signed)
Patient verbalizes understanding of discharge instructions. Opportunity for questioning and answers were provided. Armband removed by staff, pt discharged from ED via stretcher with [facility] to [facility]

## 2018-12-18 NOTE — Procedures (Signed)
I was present at this session.  I have reviewed the session itself and made appropriate changes.  Hd tol well, going for 3L, flow 400. bp 130s to 140d.  Access prss ok JD  Jeneen Rinks Kionna Brier 3/10/20207:55 PM

## 2018-12-18 NOTE — ED Provider Notes (Signed)
[facility] EMERGENCY DEPARTMENT Provider Note   CSN: [id number] Arrival date & time: 12/18/18  0998    History   Chief Complaint Chief Complaint  Patient presents with  . Multiple Falls    HPI Amber Lara is a 58 y.o. female.     Patient is a 58 year old female with a history of end-stage renal disease on dialysis, DVT on Eliquis, psychosis, diabetes, hypertension, seizures and history of stroke presenting today via EMS from her skilled facility for multiple falls and altered mental status.  Patient was seen in the emergency room 7 days ago for altered mental status while at dialysis.  Facility reported that she has had multiple falls where she slid out of her wheelchair.  EMS noted that she was laying on the floor when they got there no acute distress.  Facility is also states she has had intermittent bouts of confusion.  Patient is sleeping on exam and will only answer 1 or 2 words at a time when question and goes back to sleep.  Facility reported she has not been to dialysis in a week.  On 3 3 she did have a half a course of dialysis but it was stopped due to concern for worsening mental status.  There is no report of patient using drugs or alcohol.  She has had no reported medication changes.  Because patient is so somnolent on exam it is difficult to obtain a history.  But per her records from last week she has chronic numbness of her right arm and leg  The history is provided by the EMS personnel and the nursing home. The history is limited by the absence of a caregiver.    Past Medical History:  Diagnosis Date  . Arthritis   . Chronic anemia   . CKD (chronic kidney disease), stage V ([place])   . Diabetes mellitus ([place])   . History of stroke   . Hypertension   . Obesity   . Psychosis ([place])    a. prior adm in [place] ([place]) in 2015 where pt was admitted with hallucinations, seeing men in black outfits, family members on a non-existent  camera, hearing voices and seeing animals - UDS positive for methamphetamines at that time and patient stated she believed someone had put this in her body.  . Seizures [facility])     Patient Active Problem List   Diagnosis Date Noted  . Acute renal failure (ARF) ([place]) 10/09/2018  . Acute deep vein thrombosis (DVT) of femoral vein of right lower extremity ([place]) 10/05/2018  . ESRD (end stage renal disease) ([place])   . CKD (chronic kidney disease), stage V ([place]) 06/04/2018  . Acute on chronic renal failure ([place]) 08/03/2017  . Seizure ([place]) 08/03/2017  . Hyperkalemia 07/14/2017  . Acute kidney injury superimposed on chronic kidney disease ([place]) 07/14/2017  . Type 2 diabetes mellitus with hyperglycemia ([place]) 06/23/2017  . Normochromic normocytic anemia 06/23/2017  . Essential hypertension 06/23/2017    Past Surgical History:  Procedure Laterality Date  . ABDOMINAL HYSTERECTOMY    . AV FISTULA PLACEMENT Right 06/07/2018   Procedure: CREATION OF RIGHT BRACHIOCEPHALIC ARTERIOVENOUS FISTULA;  Surgeon: Serafina Mitchell, MD;  Location: [facility];  Service: Vascular;  Laterality: Right;  . IR DIALY SHUNT INTRO [place] W/IMG RIGHT Right 10/09/2018  . IR US GUIDE VASC ACCESS RIGHT  10/09/2018     OB History   No obstetric history on file.      Home Medications  Prior to Admission medications   Medication Sig Start Date End Date Taking? Authorizing Provider  acetaminophen (TYLENOL) 325 MG tablet Take 2 tablets (650 mg total) by mouth every 6 (six) hours as needed for mild pain or moderate pain (or Fever >/= 101). 07/15/17   Hongalgi, Lenis Dickinson, MD  amLODipine (NORVASC) 10 MG tablet Take 1 tablet (10 mg total) by mouth daily. 07/23/18   Scot Jun, FNP  apixaban (ELIQUIS) 5 MG TABS tablet Take 2 tablets (10 mg total) by mouth 2 (two) times daily for 1 day. 10/16/18 10/17/18  Shelly Coss, MD  apixaban (ELIQUIS) 5 MG TABS tablet Take 1 tablet (5 mg total) by mouth 2 (two) times  daily. 10/18/18 12/11/18  Shelly Coss, MD  atorvastatin (LIPITOR) 40 MG tablet Take 1 tablet (40 mg total) by mouth daily at 6 PM. 07/19/18   Scot Jun, FNP  benztropine (COGENTIN) 2 MG tablet Take 2 mg by mouth 2 (two) times daily. 03/24/17   [provider]  blood glucose meter kit and supplies KIT Dispense based on patient and insurance preference. Use up to four times daily as directed. (FOR ICD-9 250.00, 250.01). 07/23/18   Scot Jun, FNP  Ferrous Fumarate (HEMOCYTE - 106 MG FE) 324 (106 Fe) MG TABS tablet Take 1 tablet (106 mg of iron total) by mouth daily. 07/25/18   Scot Jun, FNP  gabapentin (NEURONTIN) 100 MG capsule Take 1 capsule (100 mg total) by mouth 2 (two) times daily. 07/19/18   Scot Jun, FNP  insulin aspart (NOVOLOG) 100 UNIT/ML injection Inject 3 times daily based on sliding scale: 125=0 units 126-180=1 unit, 181-200=2 units 201-250 administer= 4 units 251-300 administer =6 units 301 or more 8 units-call provider 07/23/18   Scot Jun, FNP  insulin glargine (LANTUS) 100 UNIT/ML injection Inject 0.3 mLs (30 Units total) into the skin at bedtime. 10/16/18 02/13/19  Shelly Coss, MD  Insulin Pen Needle (NOVOFINE) 30G X 8 MM MISC Inject insulin into skin per units prescribed. 07/19/17   Scot Jun, FNP  Insulin Syringe-Needle U-100 (TRUEPLUS INSULIN SYRINGE) 31G X 5/16" 0.3 ML MISC Use as directed 07/19/18   Scot Jun, FNP  levETIRAcetam (KEPPRA) 500 MG tablet Take 1 tablet (500 mg total) by mouth 2 (two) times daily. 07/19/18 10/05/18  Scot Jun, FNP  lisinopril (PRINIVIL,ZESTRIL) 10 MG tablet Take 1 tablet (10 mg total) by mouth daily. 10/16/18   Shelly Coss, MD  metoprolol tartrate (LOPRESSOR) 50 MG tablet Take 1 tablet (50 mg total) by mouth 2 (two) times daily. 07/19/18 10/05/18  Scot Jun, FNP  Multiple Vitamin (MULTIVITAMIN WITH MINERALS) TABS tablet Take 1 tablet by mouth daily.    [provider]  promethazine (PHENERGAN) 25 MG tablet Take 1 tablet (25 mg total) by mouth every 6 (six) hours as needed for nausea or vomiting. 07/19/18   Scot Jun, FNP    Family History Family History  Problem Relation Age of Onset  . Diabetes Mellitus II Mother   . CAD Mother        First MI in her 74s, passed away before age 36  . Renal Disease Mother   . Diabetes Mellitus II Sister   . CAD Sister        Stents in her early 73s  . Renal Disease Sister   . Diabetes Mellitus II Brother     Social History Social History   Tobacco Use  . Smoking status:  Current Some [name] Smoker  . Smokeless tobacco: Never Used  Substance Use Topics  . Alcohol use: No  . Drug use: No     Allergies   Penicillins; Tetracyclines & related; Morphine and related; Amoxicillin; Latex; and Tape   Review of Systems Review of Systems  Unable to perform ROS: Mental status change     Physical Exam Updated Vital Signs Pulse 65   Temp 97.9 F (36.6 C) (Oral)   Resp 17   Ht 5' 3"  (1.6 m)   Wt 117.9 kg   SpO2 100%   BMI 46.06 kg/m   Physical Exam Vitals signs and nursing note reviewed.  Constitutional:      General: She is sleeping. She is not in acute distress.    Appearance: She is well-developed. She is obese.  HENT:     Head: Normocephalic and atraumatic.     Nose: Nose normal.  Eyes:     Extraocular Movements: Extraocular movements intact.     Pupils: Pupils are equal, round, and reactive to light.  Cardiovascular:     Rate and Rhythm: Normal rate and regular rhythm.     Heart sounds: Normal heart sounds. No murmur. No friction rub.  Pulmonary:     Effort: Pulmonary effort is normal.     Breath sounds: Normal breath sounds. No wheezing or rales.  Abdominal:     General: Bowel sounds are normal. There is no distension.     Palpations: Abdomen is soft.     Tenderness: There is no abdominal tenderness. There is no guarding or rebound.  Musculoskeletal: Normal range  of motion.        General: No tenderness.     Right lower leg: Edema present.     Left lower leg: Edema present.     Comments: Fistula in the RUE with palpable thrill  Skin:    General: Skin is warm and dry.     Findings: No rash.  Neurological:     Mental Status: She is lethargic.     Comments: No facial droop noted.  Is oriented to month and place.  Does move upper and lower ext but to sleepy to follow commands  Psychiatric:     Comments: Pt is constantly sleeping and difficult to arouse.  Unable to assess psych at this time.      ED Treatments / Results  Labs (all labs ordered are listed, but only abnormal results are displayed) Labs Reviewed  CBC WITH DIFFERENTIAL/PLATELET - Abnormal; Notable for the following components:      Result Value   RBC 2.77 (*)    Hemoglobin 7.5 (*)    HCT 23.0 (*)    All other components within normal limits  COMPREHENSIVE METABOLIC PANEL - Abnormal; Notable for the following components:   Sodium 126 (*)    Chloride 91 (*)    CO2 19 (*)    Glucose, Bld 111 (*)    BUN 73 (*)    Creatinine, Ser 8.86 (*)    Albumin 3.3 (*)    GFR calc non Af Amer 5 (*)    GFR calc Af Amer 5 (*)    Anion gap 16 (*)    All other components within normal limits  POCT I-STAT EG7 - Abnormal; Notable for the following components:   pCO2, Ven 40.3 (*)    pO2, Ven 96.0 (*)    Acid-base deficit 3.0 (*)    Sodium 120 (*)    Calcium, Ion 1.13 (*)  HCT 20.0 (*)    Hemoglobin 6.8 (*)    All other components within normal limits  AMMONIA    EKG EKG Interpretation  Date/Time:  Tuesday December 18 2018 08:26:05 EDT Ventricular Rate:  65 PR Interval:    QRS Duration: 82 QT Interval:  435 QTC Calculation: 453 R Axis:   58 Text Interpretation:  Sinus rhythm Prolonged PR interval No significant change since last tracing Confirmed by Blanchie Dessert [phone]) on 12/18/2018 8:36:31 AM   Radiology Ct Head Wo Contrast  Result Date: 12/18/2018 CLINICAL DATA:   Altered level of consciousness. EXAM: CT HEAD WITHOUT CONTRAST TECHNIQUE: Contiguous axial images were obtained from the base of the skull through the vertex without intravenous contrast. COMPARISON:  CT head 12/11/2018, 07/11/2017 FINDINGS: Brain: Generalized atrophy. Negative for hydrocephalus. Chronic left MCA infarct with enlargement of the left lateral ventricle due to volume loss. Chronic infarct involving the left frontal lobe as well as the left temporal and parietal and occipital lobes unchanged from the recent study. In addition, chronic microvascular ischemic change in the white matter bilaterally. Hypodensity in the pons and brachium pontis bilaterally due to chronic ischemia. 6 mm hyperdensity in the right lateral pons is unchanged from the recent study but not present in 2018. I would favor this represents calcification or chronic hemorrhage. Doubt acute hemorrhage. MRI may be helpful for further evaluation. Vascular: Negative for hyperdense vessel Skull: Negative Sinuses/Orbits: Negative Other: None IMPRESSION: Atrophy and chronic ischemic changes as above. Chronic left MCA infarct. No acute ischemic infarct. 6 mm hyperdensity right lateral pons unchanged from the recent study but not present in 2018. Burtis Junes an area of chronic hemorrhage or calcification. Doubt acute hemorrhage. Consider MRI for further evaluation. Electronically Signed   By: Franchot Gallo M.D.   On: 12/18/2018 10:03   Dg Chest Port 1 View  Result Date: 12/18/2018 CLINICAL DATA:  Multiple falls. EXAM: PORTABLE CHEST 1 VIEW COMPARISON:  December 11, 2018 FINDINGS: The heart size and mediastinal contours are within normal limits. Both lungs are clear. The visualized skeletal structures are unremarkable. IMPRESSION: No active disease. Electronically Signed   By: Dorise Bullion III M.D   On: 12/18/2018 09:42    Procedures Procedures (including critical care time)  Medications Ordered in ED Medications - No data to  display   Initial Impression / Assessment and Plan / ED Course  I have reviewed the triage vital signs and the nursing notes.  Pertinent labs & imaging results that were available during my care of the patient were reviewed by me and considered in my medical decision making (see chart for details).       58 year old female with multiple medical problems arriving today with report of multiple falls of sliding out of her wheelchair and altered mental status.  Patient does have a history of psychosis and mild altered mental status but does not usually have recurrent falls.  Also reported the patient has not been to dialysis all week but it is unclear why.  Patient is in no acute distress on exam but is very somnolent and difficult to arouse.  She cannot tell you the month and where we are located but then goes right back to sleep.  She has distal edema but stable vital signs.  Oxygen saturation 90 to 91% on room air.  She has no evidence of trauma to her head or neck.  Is appear fluid overloaded and concern for uremia as the cause of her altered mental status.  However  we will also check potassium levels, head CT as she has had falls and she is on Eliquis as well as ammonia.  Patient's labs today without acute findings except for mild anemia which is most likely from not having dialysis recently as well as mild acidosis which again is related to her uremia and need for dialysis.  Potassium levels are normal today, ammonia and CO2 levels are within normal limits.  Head CT shows a hyperdense area that 6 mm but has not changed and is favored to be more chronic.  Patient is extremely sedated however feel that it is related to her medications his sedation is also probably what is causing her to fall out of her wheelchair because she is so drowsy.  She is currently on gabapentin, Cogentin and she recently started geodon 63m bid.  That the new addition of Geodon is causing excessive drowsiness which is probably  why she is having falls and change in mental status.  Recommend the dosing be changed or discontinued per facility recommendation.  Patient will be transported directly to dialysis today and then back to her facility. Final Clinical Impressions(s) / ED Diagnoses   Final diagnoses:  Adverse drug effect, initial encounter  Drowsiness    ED Discharge Orders    None       PBlanchie Dessert MD 12/18/18 [phone]

## 2018-12-18 NOTE — ED Notes (Signed)
Report called to [facility] RN, pt has 1230 chair time scheduled. Secretary notified of need for PTAR transport MD updated.

## 2018-12-18 NOTE — Progress Notes (Addendum)
Reason for Consult: To manage dialysis and dialysis related needs Referring Physician: Dr. Bartolo Darter [name] is an 58 y.o. female.  HPI: Pt is a 56F with ESRD on HD TTS [facility], HTN, psychosis who is now seen in consultation at the request of Dr. Maryan Rued for management of ESRD and provision of HD. Pt presented earlier today to the ED with AMS.  Was falling out of her wheelchair at SNF this AM.  Thought to be oversedation with mult psych meds including Geodon 40 mg BID and gabapentin 100 mg BID.  Labs were at baseline, head CT was negative, CXR clear.  Became more alert HD unit was contacted by EDP and pt was transported to HD for appointment at 12:30.  Once there, she was sent back to ED due to reportedly missing a week of HD.  In this setting we are asked to see.    Review of OP records notes that last HD at [facility] was last Thursday.  Missed 3/7.  Unclear exactly why she was sent back (AMS vs other reason), no documentation available in OP dialysis records.   Pt reports feeling "OK".  She is alert and oriented  Denies f/c, n/v, CP, SOB.  + LE edema.  Feels bloated. Appears that she has been leaving over EDW and sometimes cuts time short.  Dialyzes at [facility] TTS 4 hrs  EDW 99 kg HD Bath 2K/ 2Ca Dialyzer F180, Heparin 1000 u bolus. Access LUE AVF Venofer 100 mg x 10 rx Mircera 100 last given 3/5  Past Medical History:  Diagnosis Date  . Arthritis   . Chronic anemia   . CKD (chronic kidney disease), stage V ([place])   . Diabetes mellitus ([place])   . History of stroke   . Hypertension   . Obesity   . Psychosis ([place])    a. prior adm in [place] ([place]) in 2015 where pt was admitted with hallucinations, seeing men in black outfits, family members on a non-existent camera, hearing voices and seeing animals - UDS positive for methamphetamines at that time and patient stated she believed someone had put this in her body.  . Seizures ([place])     Past Surgical History:  Procedure  Laterality Date  . ABDOMINAL HYSTERECTOMY    . AV FISTULA PLACEMENT Right 06/07/2018   Procedure: CREATION OF RIGHT BRACHIOCEPHALIC ARTERIOVENOUS FISTULA;  Surgeon: Serafina Mitchell, MD;  Location: [facility];  Service: Vascular;  Laterality: Right;  . IR DIALY SHUNT INTRO [place] W/IMG RIGHT Right 10/09/2018  . IR US GUIDE VASC ACCESS RIGHT  10/09/2018    Family History  Problem Relation Age of Onset  . Diabetes Mellitus II Mother   . CAD Mother        First MI in her 105s, passed away before age 90  . Renal Disease Mother   . Diabetes Mellitus II Sister   . CAD Sister        Stents in her early 18s  . Renal Disease Sister   . Diabetes Mellitus II Brother     Social History:  reports that she has been smoking. She has never used smokeless tobacco. She reports that she does not drink alcohol or use drugs.  Allergies:  Allergies  Allergen Reactions  . Penicillins Anaphylaxis and Hives    Has patient had a PCN reaction causing immediate rash, facial/tongue/throat swelling, SOB or lightheadedness with hypotension: yes Has patient had a PCN reaction causing severe rash involving mucus membranes or  skin n24} If all of the above answers are "NO", then may proceed with Cephalosporin use.ecrosis: no Has patient had a PCN reaction that required hospitalization: no Has patient had a PCN reaction occurring within the last 10 years:no  . Tetracyclines & Related Anaphylaxis and Hives  . Morphine And Related Hives  . Amoxicillin     Same as penicillin  . Latex Itching and Rash  . Tape Itching and Rash    Medications:  Scheduled: . [START ON 12/19/2018] Chlorhexidine Gluconate Cloth  6 each Topical Q0600  . heparin  1,000 Units Dialysis Once in dialysis  . injection device for insulin   Other Once    Results for orders placed or performed during the hospital encounter of 12/18/18 (from the past 48 hour(s))  CBC with Differential/Platelet     Status: Abnormal   Collection  Time: 12/18/18  8:53 AM  Result Value Ref Range   WBC 7.4 4.0 - 10.5 K/uL   RBC 2.77 (L) 3.87 - 5.11 MIL/uL   Hemoglobin 7.5 (L) 12.0 - 15.0 g/dL   HCT 23.0 (L) 36.0 - 46.0 %   MCV 83.0 80.0 - 100.0 fL   MCH 27.1 26.0 - 34.0 pg   MCHC 32.6 30.0 - 36.0 g/dL   RDW 15.0 11.5 - 15.5 %   Platelets 269 150 - 400 K/uL   nRBC 0.0 0.0 - 0.2 %   Neutrophils Relative % 60 %   Neutro Abs 4.4 1.7 - 7.7 K/uL   Lymphocytes Relative 28 %   Lymphs Abs 2.1 0.7 - 4.0 K/uL   Monocytes Relative 9 %   Monocytes Absolute 0.6 0.1 - 1.0 K/uL   Eosinophils Relative 3 %   Eosinophils Absolute 0.3 0.0 - 0.5 K/uL   Basophils Relative 0 %   Basophils Absolute 0.0 0.0 - 0.1 K/uL   Immature Granulocytes 0 %   Abs Immature Granulocytes 0.02 0.00 - 0.07 K/uL    Comment: Performed at [facility], [address], [place], [place] [address]  Comprehensive metabolic panel     Status: Abnormal   Collection Time: 12/18/18  8:53 AM  Result Value Ref Range   Sodium 126 (L) 135 - 145 mmol/L   Potassium 4.8 3.5 - 5.1 mmol/L   Chloride 91 (L) 98 - 111 mmol/L   CO2 19 (L) 22 - 32 mmol/L   Glucose, Bld 111 (H) 70 - 99 mg/dL   BUN 73 (H) 6 - 20 mg/dL   Creatinine, Ser 8.86 (H) 0.44 - 1.00 mg/dL   Calcium 9.0 8.9 - 10.3 mg/dL   Total Protein 6.6 6.5 - 8.1 g/dL   Albumin 3.3 (L) 3.5 - 5.0 g/dL   AST 18 15 - 41 U/L   ALT 15 0 - 44 U/L   Alkaline Phosphatase 119 38 - 126 U/L   Total Bilirubin 0.9 0.3 - 1.2 mg/dL   GFR calc non Af Amer 5 (L) >60 mL/min   GFR calc Af Amer 5 (L) >60 mL/min   Anion gap 16 (H) 5 - 15    Comment: Performed at [facility], [place] [address] [place], [place] [address]  Ammonia     Status: None   Collection Time: 12/18/18  8:53 AM  Result Value Ref Range   Ammonia 19 9 - 35 umol/L    Comment: Performed at [facility], [place] [address], [place], [place] [address]  POCT I-Stat EG7     Status: Abnormal  Collection Time: 12/18/18  9:02 AM  Result Value Ref Range   pH, Ven  7.359 7.250 - 7.430   pCO2, Ven 40.3 (L) 44.0 - 60.0 mmHg   pO2, Ven 96.0 (H) 32.0 - 45.0 mmHg   Bicarbonate 22.7 20.0 - 28.0 mmol/L   TCO2 24 22 - 32 mmol/L   O2 Saturation 97.0 %   Acid-base deficit 3.0 (H) 0.0 - 2.0 mmol/L   Sodium 120 (L) 135 - 145 mmol/L   Potassium 4.8 3.5 - 5.1 mmol/L   Calcium, Ion 1.13 (L) 1.15 - 1.40 mmol/L   HCT 20.0 (L) 36.0 - 46.0 %   Hemoglobin 6.8 (LL) 12.0 - 15.0 g/dL   Patient temperature HIDE    Sample type VENOUS    Comment NOTIFIED PHYSICIAN     Ct Head Wo Contrast  Result Date: 12/18/2018 CLINICAL DATA:  Altered level of consciousness. EXAM: CT HEAD WITHOUT CONTRAST TECHNIQUE: Contiguous axial images were obtained from the base of the skull through the vertex without intravenous contrast. COMPARISON:  CT head 12/11/2018, 07/11/2017 FINDINGS: Brain: Generalized atrophy. Negative for hydrocephalus. Chronic left MCA infarct with enlargement of the left lateral ventricle due to volume loss. Chronic infarct involving the left frontal lobe as well as the left temporal and parietal and occipital lobes unchanged from the recent study. In addition, chronic microvascular ischemic change in the white matter bilaterally. Hypodensity in the pons and brachium pontis bilaterally due to chronic ischemia. 6 mm hyperdensity in the right lateral pons is unchanged from the recent study but not present in 2018. I would favor this represents calcification or chronic hemorrhage. Doubt acute hemorrhage. MRI may be helpful for further evaluation. Vascular: Negative for hyperdense vessel Skull: Negative Sinuses/Orbits: Negative Other: None IMPRESSION: Atrophy and chronic ischemic changes as above. Chronic left MCA infarct. No acute ischemic infarct. 6 mm hyperdensity right lateral pons unchanged from the recent study but not present in 2018. Amber Lara an area of chronic hemorrhage or calcification. Doubt acute hemorrhage. Consider MRI for further evaluation. Electronically Signed   By:  Franchot Gallo M.D.   On: 12/18/2018 10:03   Dg Chest Port 1 View  Result Date: 12/18/2018 CLINICAL DATA:  Multiple falls. EXAM: PORTABLE CHEST 1 VIEW COMPARISON:  December 11, 2018 FINDINGS: The heart size and mediastinal contours are within normal limits. Both lungs are clear. The visualized skeletal structures are unremarkable. IMPRESSION: No active disease. Electronically Signed   By: Dorise Bullion III M.D   On: 12/18/2018 09:42    ROS: all other systems reviewed and are negative except as per HPI Height 5\' 3"  (1.6 m), weight 117.9 kg. .  GEN NAD, sitting up in bed HEENT  EOMI PERRL NECK no JVD PULM normal WOB, diminished at bases CV RRR no m/r/g ABD obese, sl distended EXT 3+ LE edema with chronic changes NEURO AAO x 3, some slurring of speech, no asterixis SKIN no rashes or lesions ACCESS: LUE AVF + T/B  Assessment/Plan: 1 AMS: appears to be improved from prior ED visit this AM per report.  Likely needs Geodon dose decreased and would suggest gabapentin be reduced to 100 mg QHS only.   2 ESRD: TTS [place].  Will plan for HD here, I see no reason she can't be discharged afterwards.   3 Hypertension: expect to improve with UF, question if weight is accurate- will try to get standing wts   4. Anemia of ESRD: will give venofer with HD, mircera given 3/5. 5. Metabolic Bone Disease: renal  diet/ binders\ 6.  Femoral DVT.  On Eliquis 7. Psychosis- will likely need meds adjusted with OP psychiatrist 8. DM: historically uncontrolled 9.  Dispo: would be OK to d/c after dialysis from renal perspective.  She will need continued efforts at fluid restriction controlling DM  Amber Lara 12/18/2018, 1:09 PM

## 2018-12-18 NOTE — Discharge Instructions (Signed)
It appears that Amber Lara is receiving too much psychiatric medication which is making her excessively drowsy.  This is also the reason why she continues to fall out of her wheelchair.  40 mg of Geodon twice daily is too much.  This medication needs to be [place] to 20 mg or discontinued until she is seen by the doctor who prescribed it.  Otherwise all lab work today was normal and no sign of head bleed from her fall.

## 2018-12-19 NOTE — Progress Notes (Signed)
Tx completed, well tolerated, pt is stable, alert, intermittent confusion. Transported via [facility] to [facility] nursing home. Vitals stable and report called to [facility], pts primary nurse.

## 2019-01-16 DIAGNOSIS — F22 Delusional disorders: Secondary | ICD-10-CM | POA: Diagnosis not present

## 2019-01-16 DIAGNOSIS — F201 Disorganized schizophrenia: Secondary | ICD-10-CM | POA: Diagnosis not present

## 2019-01-16 DIAGNOSIS — F419 Anxiety disorder, unspecified: Secondary | ICD-10-CM | POA: Diagnosis not present

## 2019-02-13 DIAGNOSIS — F419 Anxiety disorder, unspecified: Secondary | ICD-10-CM | POA: Diagnosis not present

## 2019-02-13 DIAGNOSIS — F201 Disorganized schizophrenia: Secondary | ICD-10-CM | POA: Diagnosis not present

## 2019-02-13 DIAGNOSIS — F22 Delusional disorders: Secondary | ICD-10-CM | POA: Diagnosis not present

## 2019-04-22 ENCOUNTER — Ambulatory Visit: Payer: Medicare Other | Admitting: Podiatry

## 2019-05-28 ENCOUNTER — Emergency Department (HOSPITAL_COMMUNITY): Payer: Medicare Other

## 2019-05-28 ENCOUNTER — Emergency Department (HOSPITAL_COMMUNITY)
Admission: EM | Admit: 2019-05-28 | Discharge: 2019-05-28 | Disposition: A | Discharge status: Home / Self Care | Payer: Medicare Other | Attending: Emergency Medicine | Admitting: Emergency Medicine

## 2019-05-28 ENCOUNTER — Encounter (HOSPITAL_COMMUNITY): Payer: Self-pay

## 2019-05-28 ENCOUNTER — Other Ambulatory Visit: Payer: Self-pay

## 2019-05-28 DIAGNOSIS — N186 End stage renal disease: Secondary | ICD-10-CM | POA: Insufficient documentation

## 2019-05-28 DIAGNOSIS — Z91040 Latex allergy status: Secondary | ICD-10-CM | POA: Insufficient documentation

## 2019-05-28 DIAGNOSIS — Z79899 Other long term (current) drug therapy: Secondary | ICD-10-CM | POA: Insufficient documentation

## 2019-05-28 DIAGNOSIS — Z794 Long term (current) use of insulin: Secondary | ICD-10-CM | POA: Insufficient documentation

## 2019-05-28 DIAGNOSIS — F17200 Nicotine dependence, unspecified, uncomplicated: Secondary | ICD-10-CM | POA: Diagnosis not present

## 2019-05-28 DIAGNOSIS — E1122 Type 2 diabetes mellitus with diabetic chronic kidney disease: Secondary | ICD-10-CM | POA: Insufficient documentation

## 2019-05-28 DIAGNOSIS — R4182 Altered mental status, unspecified: Secondary | ICD-10-CM | POA: Insufficient documentation

## 2019-05-28 DIAGNOSIS — Z992 Dependence on renal dialysis: Secondary | ICD-10-CM | POA: Insufficient documentation

## 2019-05-28 DIAGNOSIS — I120 Hypertensive chronic kidney disease with stage 5 chronic kidney disease or end stage renal disease: Secondary | ICD-10-CM | POA: Diagnosis not present

## 2019-05-28 LAB — COMPREHENSIVE METABOLIC PANEL
ALT: 17 U/L (ref 0–44)
AST: 21 U/L (ref 15–41)
Albumin: 3.6 g/dL (ref 3.5–5.0)
Alkaline Phosphatase: 119 U/L (ref 38–126)
Anion gap: 14 (ref 5–15)
BUN: 26 mg/dL — ABNORMAL HIGH (ref 6–20)
CO2: 28 mmol/L (ref 22–32)
Calcium: 8.3 mg/dL — ABNORMAL LOW (ref 8.9–10.3)
Chloride: 93 mmol/L — ABNORMAL LOW (ref 98–111)
Creatinine, Ser: 4.58 mg/dL — ABNORMAL HIGH (ref 0.44–1.00)
GFR calc Af Amer: 12 mL/min — ABNORMAL LOW (ref >60)
GFR calc non Af Amer: 10 mL/min — ABNORMAL LOW (ref >60)
Glucose, Bld: 148 mg/dL — ABNORMAL HIGH (ref 70–99)
Potassium: 3.4 mmol/L — ABNORMAL LOW (ref 3.5–5.1)
Sodium: 135 mmol/L (ref 135–145)
Total Bilirubin: 1 mg/dL (ref 0.3–1.2)
Total Protein: 7.7 g/dL (ref 6.5–8.1)

## 2019-05-28 LAB — ACETAMINOPHEN LEVEL: Acetaminophen (Tylenol), Serum: <10 ug/mL — ABNORMAL LOW (ref 10–30)

## 2019-05-28 LAB — CBC
HCT: 33 % — ABNORMAL LOW (ref 36.0–46.0)
Hemoglobin: 10.4 g/dL — ABNORMAL LOW (ref 12.0–15.0)
MCH: 26.8 pg (ref 26.0–34.0)
MCHC: 31.5 g/dL (ref 30.0–36.0)
MCV: 85.1 fL (ref 80.0–100.0)
Platelets: 214 10*3/uL (ref 150–400)
RBC: 3.88 MIL/uL (ref 3.87–5.11)
RDW: 16.9 % — ABNORMAL HIGH (ref 11.5–15.5)
WBC: 5.2 10*3/uL (ref 4.0–10.5)
nRBC: 0 % (ref 0.0–0.2)

## 2019-05-28 LAB — CBG MONITORING, ED: Glucose-Capillary: 142 mg/dL — ABNORMAL HIGH (ref 70–99)

## 2019-05-28 LAB — SALICYLATE LEVEL: Salicylate Lvl: <7 mg/dL (ref 2.8–30.0)

## 2019-05-28 LAB — AMMONIA: Ammonia: 68 umol/L — ABNORMAL HIGH (ref 9–35)

## 2019-05-28 LAB — ETHANOL: Alcohol, Ethyl (B): <10 mg/dL (ref <10)

## 2019-05-28 MED ORDER — SODIUM CHLORIDE 0.9% FLUSH: 3.0000 mL | Freq: Once | INTRAVENOUS | Status: DC

## 2019-05-28 NOTE — ED Provider Notes (Signed)
[facility] EMERGENCY DEPARTMENT Provider Note   CSN: [id number] Arrival date & time: 05/28/19  1641     History   Chief Complaint Chief Complaint  Patient presents with  . Altered Mental Status    HPI Amber Lara is a 58 y.o. female.     Patient is a 58 year old female with past medical history of diabetes, end-stage renal disease on hemodialysis, hypertension.  She presents today for evaluation of altered mental status.  Patient was at dialysis this afternoon when she became somnolent and dialysis nurses had a difficult time waking her.  I am told she was initially hypotensive and given a slight bolus of fluid in route.  She is now normotensive, but remains somnolent and difficult to arouse.  Patient adds no useful history secondary to acuity of condition.  The history is provided by the patient.  Altered Mental Status Presenting symptoms: lethargy and partial responsiveness   Severity:  Moderate Most recent episode:  Today Episode history:  Single Timing:  Constant Progression:  Unchanged   Past Medical History:  Diagnosis Date  . Arthritis   . Chronic anemia   . CKD (chronic kidney disease), stage V ([place])   . Diabetes mellitus ([place])   . History of stroke   . Hypertension   . Obesity   . Psychosis ([place])    a. prior adm in [place] ([place]) in 2015 where pt was admitted with hallucinations, seeing men in black outfits, family members on a non-existent camera, hearing voices and seeing animals - UDS positive for methamphetamines at that time and patient stated she believed someone had put this in her body.  . Seizures [facility])     Patient Active Problem List   Diagnosis Date Noted  . Acute renal failure (ARF) ([place]) 10/09/2018  . Acute deep vein thrombosis (DVT) of femoral vein of right lower extremity ([place]) 10/05/2018  . ESRD (end stage renal disease) ([place])   . CKD (chronic kidney disease), stage V ([place]) 06/04/2018  . Acute on  chronic renal failure ([place]) 08/03/2017  . Seizure ([place]) 08/03/2017  . Hyperkalemia 07/14/2017  . Acute kidney injury superimposed on chronic kidney disease ([place]) 07/14/2017  . Type 2 diabetes mellitus with hyperglycemia ([place]) 06/23/2017  . Normochromic normocytic anemia 06/23/2017  . Essential hypertension 06/23/2017    Past Surgical History:  Procedure Laterality Date  . ABDOMINAL HYSTERECTOMY    . AV FISTULA PLACEMENT Right 06/07/2018   Procedure: CREATION OF RIGHT BRACHIOCEPHALIC ARTERIOVENOUS FISTULA;  Surgeon: Serafina Mitchell, MD;  Location: [place];  Service: Vascular;  Laterality: Right;  . IR DIALY SHUNT INTRO [place] W/IMG RIGHT Right 10/09/2018  . IR US GUIDE VASC ACCESS RIGHT  10/09/2018     OB History   No obstetric history on file.      Home Medications    Prior to Admission medications   Medication Sig Start Date End Date Taking? Authorizing Provider  acetaminophen (TYLENOL) 325 MG tablet Take 2 tablets (650 mg total) by mouth every 6 (six) hours as needed for mild pain or moderate pain (or Fever >/= 101). 07/15/17   Hongalgi, Lenis Dickinson, MD  amLODipine (NORVASC) 10 MG tablet Take 1 tablet (10 mg total) by mouth daily. 07/23/18   Scot Jun, FNP  apixaban (ELIQUIS) 5 MG TABS tablet Take 2 tablets (10 mg total) by mouth 2 (two) times daily for 1 day. Patient not taking: Reported on 12/18/2018 10/16/18 10/17/18  Shelly Coss, MD  apixaban (  ELIQUIS) 5 MG TABS tablet Take 1 tablet (5 mg total) by mouth 2 (two) times daily. Patient not taking: Reported on 12/18/2018 10/18/18 12/11/18  Shelly Coss, MD  atorvastatin (LIPITOR) 40 MG tablet Take 1 tablet (40 mg total) by mouth daily at 6 PM. Patient not taking: Reported on 12/18/2018 07/19/18   Scot Jun, FNP  blood glucose meter kit and supplies KIT Dispense based on patient and insurance preference. Use up to four times daily as directed. (FOR ICD-9 250.00, 250.01). 07/23/18   Scot Jun, FNP   Ferrous Fumarate (HEMOCYTE - 106 MG FE) 324 (106 Fe) MG TABS tablet Take 1 tablet (106 mg of iron total) by mouth daily. Patient not taking: Reported on 12/18/2018 07/25/18   Scot Jun, FNP  gabapentin (NEURONTIN) 100 MG capsule Take 1 capsule (100 mg total) by mouth 2 (two) times daily. 07/19/18   Scot Jun, FNP  insulin aspart (NOVOLOG) 100 UNIT/ML injection Inject 3 times daily based on sliding scale: 125=0 units 126-180=1 unit, 181-200=2 units 201-250 administer= 4 units 251-300 administer =6 units 301 or more 8 units-call provider Patient not taking: Reported on 12/18/2018 07/23/18   Scot Jun, FNP  insulin glargine (LANTUS) 100 UNIT/ML injection Inject 0.3 mLs (30 Units total) into the skin at bedtime. 10/16/18 02/13/19  Shelly Coss, MD  insulin lispro (HUMALOG) 100 UNIT/ML injection Inject 1-8 Units into the skin See admin instructions. Per Sliding Scale at bedtime: 126-180 1 unit 181-200 2 units 201-250 4 units 251-300 6 units 301 or greater 8 units    [provider]  Insulin Pen Needle (NOVOFINE) 30G X 8 MM MISC Inject insulin into skin per units prescribed. 07/19/17   Scot Jun, FNP  Insulin Syringe-Needle U-100 (TRUEPLUS INSULIN SYRINGE) 31G X 5/16" 0.3 ML MISC Use as directed 07/19/18   Scot Jun, FNP  levETIRAcetam (KEPPRA) 500 MG tablet Take 1 tablet (500 mg total) by mouth 2 (two) times daily. 07/19/18 12/18/18  Scot Jun, FNP  lisinopril (PRINIVIL,ZESTRIL) 10 MG tablet Take 1 tablet (10 mg total) by mouth daily. Patient not taking: Reported on 12/18/2018 10/16/18   Shelly Coss, MD  metoprolol tartrate (LOPRESSOR) 50 MG tablet Take 1 tablet (50 mg total) by mouth 2 (two) times daily. 07/19/18 12/18/18  Scot Jun, FNP  promethazine (PHENERGAN) 25 MG tablet Take 1 tablet (25 mg total) by mouth every 6 (six) hours as needed for nausea or vomiting. 07/19/18   Scot Jun, FNP  sevelamer carbonate (RENVELA)  800 MG tablet Take 800 mg by mouth 3 (three) times daily with meals.    [provider]  ziprasidone (GEODON) 40 MG capsule Take 40 mg by mouth 2 (two) times daily with a meal.    [provider]    Family History Family History  Problem Relation Age of Onset  . Diabetes Mellitus II Mother   . CAD Mother        First MI in her 25s, passed away before age 78  . Renal Disease Mother   . Diabetes Mellitus II Sister   . CAD Sister        Stents in her early 28s  . Renal Disease Sister   . Diabetes Mellitus II Brother     Social History Social History   Tobacco Use  . Smoking status: Current Some Day Smoker  . Smokeless tobacco: Never Used  Substance Use Topics  . Alcohol use: No  . Drug use:  No     Allergies   Penicillins, Tetracyclines & related, Morphine and related, Amoxicillin, Latex, and Tape   Review of Systems Review of Systems  All other systems reviewed and are negative.    Physical Exam Updated Vital Signs BP (!) 141/66   Pulse 62   Temp 98.1 F (36.7 C) (Oral)   Resp 16   Ht 5' 4"  (1.626 m)   Wt 108.9 kg   SpO2 98%   BMI 41.20 kg/m   Physical Exam Vitals signs and nursing note reviewed.  Constitutional:      General: She is not in acute distress.    Appearance: She is well-developed. She is not diaphoretic.     Comments: Patient with somnolence and difficulty waking.  She will open her eyes briefly, then goes back to sleep.  HENT:     Head: Normocephalic and atraumatic.  Eyes:     Extraocular Movements: Extraocular movements intact.     Pupils: Pupils are equal, round, and reactive to light.  Neck:     Musculoskeletal: Normal range of motion and neck supple.  Cardiovascular:     Rate and Rhythm: Normal rate and regular rhythm.     Heart sounds: No murmur. No friction rub. No gallop.   Pulmonary:     Effort: Pulmonary effort is normal. No respiratory distress.     Breath sounds: Normal breath sounds. No wheezing.      Comments: Patient protecting her airway.  There is no snoring respiration or stridor. Abdominal:     General: Bowel sounds are normal. There is no distension.     Palpations: Abdomen is soft.     Tenderness: There is no abdominal tenderness.  Musculoskeletal: Normal range of motion.  Skin:    General: Skin is warm and dry.  Neurological:     Mental Status: She is alert and oriented to person, place, and time.      ED Treatments / Results  Labs (all labs ordered are listed, but only abnormal results are displayed) Labs Reviewed  COMPREHENSIVE METABOLIC PANEL - Abnormal; Notable for the following components:      Result Value   Potassium 3.4 (*)    Chloride 93 (*)    Glucose, Bld 148 (*)    BUN 26 (*)    Creatinine, Ser 4.58 (*)    Calcium 8.3 (*)    GFR calc non Af Amer 10 (*)    GFR calc Af Amer 12 (*)    All other components within normal limits  CBC - Abnormal; Notable for the following components:   Hemoglobin 10.4 (*)    HCT 33.0 (*)    RDW 16.9 (*)    All other components within normal limits  ACETAMINOPHEN LEVEL - Abnormal; Notable for the following components:   Acetaminophen (Tylenol), Serum <10 (*)    All other components within normal limits  AMMONIA - Abnormal; Notable for the following components:   Ammonia 68 (*)    All other components within normal limits  CBG MONITORING, ED - Abnormal; Notable for the following components:   Glucose-Capillary 142 (*)    All other components within normal limits  ETHANOL  SALICYLATE LEVEL    EKG EKG Interpretation  Date/Time:  Tuesday May 28 2019 16:51:59 EDT Ventricular Rate:  63 PR Interval:    QRS Duration: 92 QT Interval:  446 QTC Calculation: 457 R Axis:   71 Text Interpretation:  Sinus rhythm Borderline prolonged PR interval Probable left atrial enlargement  Probable left ventricular hypertrophy Confirmed by Veryl Speak [phone]) on 05/28/2019 4:55:57 PM   Radiology No results found.  Procedures  Procedures (including critical care time)  Medications Ordered in ED Medications  sodium chloride flush (NS) 0.9 % injection 3 mL (has no administration in time range)     Initial Impression / Assessment and Plan / ED Course  I have reviewed the triage vital signs and the nursing notes.  Pertinent labs & imaging results that were available during my care of the patient were reviewed by me and considered in my medical decision making (see chart for details).  Patient presenting here with altered mental status while at dialysis.  Patient initially difficult to arouse, however she is now awake and talking.  Her laboratory studies showed no acute abnormality and head CT shows no acute process.  She does have an ammonia level of 68, the significance of which I am uncertain.  Patient has no history of liver disease that I can see in her chart.  Patient is now more awake and appears back to her baseline.  Patient initially telling me she does not want to go home because it is "too busy at her house".  I see no medical reason for her to stay and feel as though she is appropriate for discharge.  Final Clinical Impressions(s) / ED Diagnoses   Final diagnoses:  None    ED Discharge Orders    None       Veryl Speak, MD 05/28/19 1930

## 2019-05-28 NOTE — ED Notes (Signed)
[facility] called for pt 

## 2019-05-28 NOTE — ED Notes (Signed)
Attempted to call report to [facility] twice, but there was no answer

## 2019-05-28 NOTE — Discharge Instructions (Signed)
Continue medications as previously prescribed.  Keep your next scheduled dialysis appointment, and return to the ER if symptoms significantly worsen or change.

## 2019-05-28 NOTE — ED Notes (Signed)
Patient transported to CT 

## 2019-05-28 NOTE — ED Triage Notes (Addendum)
Pt brought by [facility] from dialysis for hypotension, lethargic, AMS. Pt normally A&Ox3. Pt 's initial bp was 68/40. Per EMS pt alert to verbal, will obey commands. EMS gave 133ml of fluids and last bp 116/70. Pt finished just about all of dialysis, but the last 74mins. Pt from [facility]. Pt GCS 10. Pt is NSR on monitor.
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
## 2019-12-26 ENCOUNTER — Emergency Department (HOSPITAL_COMMUNITY)
Admission: EM | Admit: 2019-12-26 | Discharge: 2019-12-26 | Disposition: A | Discharge status: Home / Self Care | Payer: Medicare Other | Attending: Emergency Medicine | Admitting: Emergency Medicine

## 2019-12-26 ENCOUNTER — Encounter (HOSPITAL_COMMUNITY): Payer: Self-pay | Admitting: Emergency Medicine

## 2019-12-26 ENCOUNTER — Other Ambulatory Visit: Payer: Self-pay

## 2019-12-26 ENCOUNTER — Emergency Department (HOSPITAL_COMMUNITY): Payer: Medicare Other

## 2019-12-26 DIAGNOSIS — N186 End stage renal disease: Secondary | ICD-10-CM | POA: Diagnosis not present

## 2019-12-26 DIAGNOSIS — F17200 Nicotine dependence, unspecified, uncomplicated: Secondary | ICD-10-CM | POA: Insufficient documentation

## 2019-12-26 DIAGNOSIS — R079 Chest pain, unspecified: Secondary | ICD-10-CM | POA: Insufficient documentation

## 2019-12-26 DIAGNOSIS — Z992 Dependence on renal dialysis: Secondary | ICD-10-CM | POA: Diagnosis not present

## 2019-12-26 DIAGNOSIS — Z91040 Latex allergy status: Secondary | ICD-10-CM | POA: Diagnosis not present

## 2019-12-26 DIAGNOSIS — E1122 Type 2 diabetes mellitus with diabetic chronic kidney disease: Secondary | ICD-10-CM | POA: Diagnosis not present

## 2019-12-26 DIAGNOSIS — I120 Hypertensive chronic kidney disease with stage 5 chronic kidney disease or end stage renal disease: Secondary | ICD-10-CM | POA: Diagnosis not present

## 2019-12-26 DIAGNOSIS — Z8673 Personal history of transient ischemic attack (TIA), and cerebral infarction without residual deficits: Secondary | ICD-10-CM | POA: Insufficient documentation

## 2019-12-26 LAB — CBC
HCT: 34.6 % — ABNORMAL LOW (ref 36.0–46.0)
Hemoglobin: 10.8 g/dL — ABNORMAL LOW (ref 12.0–15.0)
MCH: 26.7 pg (ref 26.0–34.0)
MCHC: 31.2 g/dL (ref 30.0–36.0)
MCV: 85.4 fL (ref 80.0–100.0)
Platelets: 308 10*3/uL (ref 150–400)
RBC: 4.05 MIL/uL (ref 3.87–5.11)
RDW: 17 % — ABNORMAL HIGH (ref 11.5–15.5)
WBC: 7.1 10*3/uL (ref 4.0–10.5)
nRBC: 0 % (ref 0.0–0.2)

## 2019-12-26 LAB — BASIC METABOLIC PANEL
Anion gap: 15 (ref 5–15)
BUN: 29 mg/dL — ABNORMAL HIGH (ref 6–20)
CO2: 30 mmol/L (ref 22–32)
Calcium: 8.6 mg/dL — ABNORMAL LOW (ref 8.9–10.3)
Chloride: 89 mmol/L — ABNORMAL LOW (ref 98–111)
Creatinine, Ser: 4.69 mg/dL — ABNORMAL HIGH (ref 0.44–1.00)
GFR calc Af Amer: 11 mL/min — ABNORMAL LOW (ref >60)
GFR calc non Af Amer: 10 mL/min — ABNORMAL LOW (ref >60)
Glucose, Bld: 149 mg/dL — ABNORMAL HIGH (ref 70–99)
Potassium: 4.1 mmol/L (ref 3.5–5.1)
Sodium: 134 mmol/L — ABNORMAL LOW (ref 135–145)

## 2019-12-26 LAB — TROPONIN I (HIGH SENSITIVITY)
Troponin I (High Sensitivity): 3 ng/L (ref <18)
Troponin I (High Sensitivity): 3 ng/L (ref <18)

## 2019-12-26 LAB — CBG MONITORING, ED: Glucose-Capillary: 161 mg/dL — ABNORMAL HIGH (ref 70–99)

## 2019-12-26 MED ORDER — SODIUM CHLORIDE 0.9% FLUSH
3.0000 mL | Freq: Once | INTRAVENOUS | Status: AC
  Administered 2019-12-26: 3 mL via INTRAVENOUS

## 2019-12-26 NOTE — ED Provider Notes (Signed)
Emergency Department Provider Note   I have reviewed the triage vital signs and the nursing notes.   HISTORY  Chief Complaint Chest Pain   HPI Amber Lara is a 59 y.o. female with PMH reviewed below presents to the ED from HD with complaint of CP. Symptoms apparently began yesterday. She describes a central CP worse with movement or breathing. Denies specific SOB. Patient is somewhat slow to respond and cannot tell me where she currently lives. Level 5 caveat applies. She does follow instructions and answers simple questions briefly and consistently.    Past Medical History:  Diagnosis Date  . Arthritis   . Chronic anemia   . CKD (chronic kidney disease), stage V ([place])   . Diabetes mellitus ([place])   . History of stroke   . Hypertension   . Obesity   . Psychosis ([place])    a. prior adm in [place] ([place]) in 2015 where pt was admitted with hallucinations, seeing men in black outfits, family members on a non-existent camera, hearing voices and seeing animals - UDS positive for methamphetamines at that time and patient stated she believed someone had put this in her body.  . Seizures [facility])     Patient Active Problem List   Diagnosis Date Noted  . Acute renal failure (ARF) ([place]) 10/09/2018  . Acute deep vein thrombosis (DVT) of femoral vein of right lower extremity ([place]) 10/05/2018  . ESRD (end stage renal disease) ([place])   . CKD (chronic kidney disease), stage V ([place]) 06/04/2018  . Acute on chronic renal failure ([place]) 08/03/2017  . Seizure ([place]) 08/03/2017  . Hyperkalemia 07/14/2017  . Acute kidney injury superimposed on chronic kidney disease ([place]) 07/14/2017  . Type 2 diabetes mellitus with hyperglycemia ([place]) 06/23/2017  . Normochromic normocytic anemia 06/23/2017  . Essential hypertension 06/23/2017    Past Surgical History:  Procedure Laterality Date  . ABDOMINAL HYSTERECTOMY    . AV FISTULA PLACEMENT Right 06/07/2018   Procedure: CREATION OF RIGHT  BRACHIOCEPHALIC ARTERIOVENOUS FISTULA;  Surgeon: Serafina Mitchell, MD;  Location: [place];  Service: Vascular;  Laterality: Right;  . IR DIALY SHUNT INTRO [place] W/IMG RIGHT Right 10/09/2018  . IR US GUIDE VASC ACCESS RIGHT  10/09/2018    Allergies Penicillins, Tetracyclines & related, Morphine and related, Amoxicillin, Latex, and Tape  Family History  Problem Relation Age of Onset  . Diabetes Mellitus II Mother   . CAD Mother        First MI in her 87s, passed away before age 47  . Renal Disease Mother   . Diabetes Mellitus II Sister   . CAD Sister        Stents in her early 25s  . Renal Disease Sister   . Diabetes Mellitus II Brother     Social History Social History   Tobacco Use  . Smoking status: Current Some Day Smoker  . Smokeless tobacco: Never Used  Substance Use Topics  . Alcohol use: No  . Drug use: No    Review of Systems  Constitutional: No fever/chills Eyes: No visual changes. ENT: No sore throat. Cardiovascular: Positive chest pain. Respiratory: Denies shortness of breath. Gastrointestinal: No abdominal pain.  No nausea, no vomiting.  No diarrhea.  No constipation. Genitourinary: Negative for dysuria. Musculoskeletal: Negative for back pain. Skin: Negative for rash. Neurological: Negative for headaches, focal weakness or numbness.  10-point ROS otherwise negative.  ____________________________________________   PHYSICAL EXAM:  VITAL SIGNS: ED Triage Vitals  Enc  Vitals Group     BP 12/26/19 1621 116/67     Pulse Rate 12/26/19 1621 78     Resp 12/26/19 1621 18     Temp 12/26/19 1621 98.4 F (36.9 C)     Temp Source 12/26/19 1621 Oral     SpO2 12/26/19 1621 100 %     Weight 12/26/19 1625 208 lb (94.3 kg)     Height 12/26/19 1625 5\' 3"  (1.6 m)   Constitutional: Alert but slow to respond at times. Cannot tell me where she lives.  Eyes: Conjunctivae are normal. PERRL.  Head: Atraumatic. Nose: No congestion/rhinnorhea.  Mouth/Throat: Mucous membranes are moist. Neck: No stridor.  Cardiovascular: Normal rate, regular rhythm. Good peripheral circulation. Grossly normal heart sounds. Right AV fistula.  Respiratory: Normal respiratory effort.  No retractions. Lungs CTAB. Gastrointestinal: Soft and nontender. No distention.  Musculoskeletal: No lower extremity tenderness nor edema. No gross deformities of extremities.  Point tenderness to palpation over the sternum which reproduces the patient's pain.  She grimaces and tells me that that is the pain she is experiencing as I press.  Neurologic:  Normal speech and language. No gross focal neurologic deficits are appreciated.  Skin:  Skin is warm, dry and intact. No rash noted.  ____________________________________________   LABS (all labs ordered are listed, but only abnormal results are displayed)  Labs Reviewed  BASIC METABOLIC PANEL - Abnormal; Notable for the following components:      Result Value   Sodium 134 (*)    Chloride 89 (*)    Glucose, Bld 149 (*)    BUN 29 (*)    Creatinine, Ser 4.69 (*)    Calcium 8.6 (*)    GFR calc non Af Amer 10 (*)    GFR calc Af Amer 11 (*)    All other components within normal limits  CBC - Abnormal; Notable for the following components:   Hemoglobin 10.8 (*)    HCT 34.6 (*)    RDW 17.0 (*)    All other components within normal limits  CBG MONITORING, ED - Abnormal; Notable for the following components:   Glucose-Capillary 161 (*)    All other components within normal limits  TROPONIN I (HIGH SENSITIVITY)  TROPONIN I (HIGH SENSITIVITY)   ____________________________________________  EKG   EKG Interpretation  Date/Time:  Thursday December 26 2019 16:20:27 EDT Ventricular Rate:  75 PR Interval:  222 QRS Duration: 88 QT Interval:  420 QTC Calculation: 469 R Axis:   70 Text Interpretation: Sinus rhythm with 1st degree A-V block Possible Left atrial enlargement Nonspecific ST changes. No STEMI Confirmed by  Nanda Quinton [phone]) on 12/26/2019 7:34:28 PM       ____________________________________________  RADIOLOGY  DG Chest 2 View  Result Date: 12/26/2019 CLINICAL DATA:  Chest pain EXAM: CHEST - 2 VIEW COMPARISON:  December 18, 2018 FINDINGS: There is a subtle hazy increased density projecting over the left lower lung zone, only visualized on the frontal view. This is favored to be secondary to breast tissue attenuation artifact. There is some atelectasis at the lung bases without evidence for significant pleural effusion or pneumothorax. The heart size is stable from prior study. IMPRESSION: No active cardiopulmonary disease. Electronically Signed   By: Constance Holster M.D.   On: 12/26/2019 16:53    ____________________________________________   PROCEDURES  Procedure(s) performed:   Procedures  None  ____________________________________________   INITIAL IMPRESSION / ASSESSMENT AND PLAN / ED COURSE  Pertinent labs & imaging results that  were available during my care of the patient were reviewed by me and considered in my medical decision making (see chart for details).   Patient presents to the emergency department with chest pain from dialysis.  Her pain is very reproducible in the center of her chest.  She does have some slow responses and possibly some confusion but answers questions directly but briefly.  When I press on her chest she grimaces and tells me that that is the pain that she is experiencing.  I have very low suspicion for PE clinically.  Her troponins are negative x2 from the waiting room.  Chest x-ray is clear.  I am attempting to get in touch with family who can provide some additional information regarding her mental status baseline and living situation.  07:58 PM  In looking back at the last ED visit the patient came in with AMS after HD and improved in the ED without intervention. She was noted to be living at [facility] at that time. I called and she is apparently  still a patient there. When transferred to the nursing station for further information there was no answer.   09:20 PM  Spoke with Meryl Crutch the patinet's nurse at [facility]  I described the patient's mental status here as outlined above and the nurse confirms that this is her baseline mental status.  With this, no further work-up at this time regarding her confusion.  I suspect her chest pain is musculoskeletal and will discharge back to facility to follow-up with her primary care doctor and continue dialysis.  ____________________________________________  FINAL CLINICAL IMPRESSION(S) / ED DIAGNOSES  Final diagnoses:  Nonspecific chest pain    MEDICATIONS GIVEN DURING THIS VISIT:  Medications  sodium chloride flush (NS) 0.9 % injection 3 mL (3 mLs Intravenous Given 12/26/19 1943)    Note:  This document was prepared using Dragon voice recognition software and may include unintentional dictation errors.  Nanda Quinton, MD, [facility] Emergency Medicine    , [facility], MD 12/26/19 [phone]

## 2019-12-26 NOTE — ED Notes (Signed)
Pt is oriented to self and place but does not know where she cam from. She says a nursing home but she is not sure. She is lethargic and slow to answer but will follow commands and answer questions. Still complains of chest pain to the center of her chest. Provider st bed side

## 2019-12-26 NOTE — ED Triage Notes (Signed)
Pt here with [facility] with co CP non-radiating that started abt 1420. Pt states that it hurts to breathe deep. Rates pain a 9.  22g left ac  324mg  aspirin  BP 118/74

## 2019-12-26 NOTE — ED Notes (Signed)
[facility] called to transport pt 

## 2019-12-26 NOTE — ED Notes (Signed)
Pt is still confused. She is oriented to herself and year. She states she is in a utility place for location. When I aske what what was bothering her today she stated her kids and that she needed to evict them.  She is lethargic and slow to answer she still maintains pain  In her central chest hurts off and on but will not give her pain a rating. Vitals WDl, will complete CBG and notify provider

## 2019-12-26 NOTE — Discharge Instructions (Signed)
You were seen in the emergency department today with chest pain.  Your work-up today was normal.  Please continue your dialysis as scheduled and call your primary care doctor for the next available follow-up appointment.  Return to the emergency department immediately if your chest pain returns or worsens.

## 2020-02-29 ENCOUNTER — Emergency Department (HOSPITAL_COMMUNITY)
Admission: EM | Admit: 2020-02-29 | Discharge: 2020-02-29 | Disposition: A | Discharge status: Other Acute Inpatient Hospital | Payer: Medicare Other | Attending: Emergency Medicine | Admitting: Emergency Medicine

## 2020-02-29 ENCOUNTER — Other Ambulatory Visit: Payer: Self-pay

## 2020-02-29 DIAGNOSIS — N186 End stage renal disease: Secondary | ICD-10-CM | POA: Insufficient documentation

## 2020-02-29 DIAGNOSIS — Z992 Dependence on renal dialysis: Secondary | ICD-10-CM | POA: Insufficient documentation

## 2020-02-29 DIAGNOSIS — I120 Hypertensive chronic kidney disease with stage 5 chronic kidney disease or end stage renal disease: Secondary | ICD-10-CM | POA: Diagnosis not present

## 2020-02-29 DIAGNOSIS — H5712 Ocular pain, left eye: Secondary | ICD-10-CM | POA: Insufficient documentation

## 2020-02-29 DIAGNOSIS — F17200 Nicotine dependence, unspecified, uncomplicated: Secondary | ICD-10-CM | POA: Insufficient documentation

## 2020-02-29 DIAGNOSIS — Z794 Long term (current) use of insulin: Secondary | ICD-10-CM | POA: Diagnosis not present

## 2020-02-29 DIAGNOSIS — E1122 Type 2 diabetes mellitus with diabetic chronic kidney disease: Secondary | ICD-10-CM | POA: Insufficient documentation

## 2020-02-29 DIAGNOSIS — Z20822 Contact with and (suspected) exposure to covid-19: Secondary | ICD-10-CM | POA: Diagnosis not present

## 2020-02-29 DIAGNOSIS — Z9889 Other specified postprocedural states: Secondary | ICD-10-CM | POA: Insufficient documentation

## 2020-02-29 LAB — COMPREHENSIVE METABOLIC PANEL
ALT: 14 U/L (ref 0–44)
AST: 16 U/L (ref 15–41)
Albumin: 3.5 g/dL (ref 3.5–5.0)
Alkaline Phosphatase: 89 U/L (ref 38–126)
Anion gap: 13 (ref 5–15)
BUN: 13 mg/dL (ref 6–20)
CO2: 31 mmol/L (ref 22–32)
Calcium: 8.6 mg/dL — ABNORMAL LOW (ref 8.9–10.3)
Chloride: 93 mmol/L — ABNORMAL LOW (ref 98–111)
Creatinine, Ser: 4.04 mg/dL — ABNORMAL HIGH (ref 0.44–1.00)
GFR calc Af Amer: 13 mL/min — ABNORMAL LOW (ref >60)
GFR calc non Af Amer: 12 mL/min — ABNORMAL LOW (ref >60)
Glucose, Bld: 107 mg/dL — ABNORMAL HIGH (ref 70–99)
Potassium: 3.8 mmol/L (ref 3.5–5.1)
Sodium: 137 mmol/L (ref 135–145)
Total Bilirubin: 0.8 mg/dL (ref 0.3–1.2)
Total Protein: 7.9 g/dL (ref 6.5–8.1)

## 2020-02-29 LAB — CBC WITH DIFFERENTIAL/PLATELET
Abs Immature Granulocytes: 0.04 10*3/uL (ref 0.00–0.07)
Basophils Absolute: 0 10*3/uL (ref 0.0–0.1)
Basophils Relative: 0 %
Eosinophils Absolute: 0.1 10*3/uL (ref 0.0–0.5)
Eosinophils Relative: 1 %
HCT: 36.1 % (ref 36.0–46.0)
Hemoglobin: 10.9 g/dL — ABNORMAL LOW (ref 12.0–15.0)
Immature Granulocytes: 0 %
Lymphocytes Relative: 15 %
Lymphs Abs: 1.3 10*3/uL (ref 0.7–4.0)
MCH: 25.5 pg — ABNORMAL LOW (ref 26.0–34.0)
MCHC: 30.2 g/dL (ref 30.0–36.0)
MCV: 84.5 fL (ref 80.0–100.0)
Monocytes Absolute: 0.6 10*3/uL (ref 0.1–1.0)
Monocytes Relative: 6 %
Neutro Abs: 7 10*3/uL (ref 1.7–7.7)
Neutrophils Relative %: 78 %
Platelets: 255 10*3/uL (ref 150–400)
RBC: 4.27 MIL/uL (ref 3.87–5.11)
RDW: 17.9 % — ABNORMAL HIGH (ref 11.5–15.5)
WBC: 9 10*3/uL (ref 4.0–10.5)
nRBC: 0 % (ref 0.0–0.2)

## 2020-02-29 LAB — SARS CORONAVIRUS 2 BY RT PCR (HOSPITAL ORDER, PERFORMED IN ~~LOC~~ HOSPITAL LAB): SARS Coronavirus 2: NEGATIVE

## 2020-02-29 MED ORDER — TETRACAINE HCL 0.5 % OP SOLN
2.0000 [drp] | Freq: Once | OPHTHALMIC | Status: AC
  Administered 2020-02-29: 2 [drp] via OPHTHALMIC
  Filled 2020-02-29: qty 4

## 2020-02-29 MED ORDER — FLUORESCEIN SODIUM 1 MG OP STRP
1.0000 | ORAL_STRIP | Freq: Once | OPHTHALMIC | Status: AC
  Administered 2020-02-29: 1 via OPHTHALMIC
  Filled 2020-02-29: qty 1

## 2020-02-29 NOTE — ED Notes (Signed)
Report given to [facility] ED charge RN

## 2020-02-29 NOTE — ED Provider Notes (Signed)
[facility] EMERGENCY DEPARTMENT Provider Note   CSN: [id number] Arrival date & time: 02/29/20  1743     History Chief Complaint  Patient presents with  . Eye Pain    Amber Lara is a 59 y.o. female with a pmh of schizophrenia, ESRD T/TH/SA with only half session today presents emergency department chief complaint of left eye pain.  History is gathered by phone call with skilled nursing facility staff at [facility].  The patient is a very poor historian.  Apparently she had some kind of a surgery to the left eye on 5/11 with [facility] in [place].  According to her home nurse the patient was having a lot of pain in her eye yesterday.  They called the nurse practitioner on-call who gave her some Norco which did not seem to help much with her pain.  Apparently during her dialysis session her pain became more severe and they had to ended earlier.  EMS reports that the patient has been unable to open her eyes.  The patient currently rates her pain as an eye a 9 out of 10. HPI     Past Medical History:  Diagnosis Date  . Arthritis   . Chronic anemia   . CKD (chronic kidney disease), stage V ([place])   . Diabetes mellitus ([place])   . History of stroke   . Hypertension   . Obesity   . Psychosis ([place])    a. prior adm in [place] ([place]) in 2015 where pt was admitted with hallucinations, seeing men in black outfits, family members on a non-existent camera, hearing voices and seeing animals - UDS positive for methamphetamines at that time and patient stated she believed someone had put this in her body.  . Seizures [facility])     Patient Active Problem List   Diagnosis Date Noted  . Acute renal failure (ARF) ([place]) 10/09/2018  . Acute deep vein thrombosis (DVT) of femoral vein of right lower extremity ([place]) 10/05/2018  . ESRD (end stage renal disease) ([place])   . CKD (chronic kidney disease), stage V ([place]) 06/04/2018  .  Acute on chronic renal failure ([place]) 08/03/2017  . Seizure ([place]) 08/03/2017  . Hyperkalemia 07/14/2017  . Acute kidney injury superimposed on chronic kidney disease ([place]) 07/14/2017  . Type 2 diabetes mellitus with hyperglycemia ([place]) 06/23/2017  . Normochromic normocytic anemia 06/23/2017  . Essential hypertension 06/23/2017    Past Surgical History:  Procedure Laterality Date  . ABDOMINAL HYSTERECTOMY    . AV FISTULA PLACEMENT Right 06/07/2018   Procedure: CREATION OF RIGHT BRACHIOCEPHALIC ARTERIOVENOUS FISTULA;  Surgeon: Serafina Mitchell, MD;  Location: [facility];  Service: Vascular;  Laterality: Right;  . IR DIALY SHUNT INTRO [place] W/IMG RIGHT Right 10/09/2018  . IR US GUIDE VASC ACCESS RIGHT  10/09/2018     OB History   No obstetric history on file.     Family History  Problem Relation Age of Onset  . Diabetes Mellitus II Mother   . CAD Mother        First MI in her 30s, passed away before age 73  . Renal Disease Mother   . Diabetes Mellitus II Sister   . CAD Sister        Stents in her early 12s  . Renal Disease Sister   . Diabetes Mellitus II Brother     Social History   Tobacco Use  . Smoking status: Current Some Day  Smoker  . Smokeless tobacco: Never Used  Substance Use Topics  . Alcohol use: No  . Drug use: No    Home Medications Prior to Admission medications   Medication Sig Start Date End Date Taking? Authorizing Provider  acetaminophen (TYLENOL) 325 MG tablet Take 2 tablets (650 mg total) by mouth every 6 (six) hours as needed for mild pain or moderate pain (or Fever >/= 101). 07/15/17   Hongalgi, Lenis Dickinson, MD  amLODipine (NORVASC) 10 MG tablet Take 1 tablet (10 mg total) by mouth daily. 07/23/18   Scot Jun, FNP  apixaban (ELIQUIS) 5 MG TABS tablet Take 2 tablets (10 mg total) by mouth 2 (two) times daily for 1 day. Patient not taking: Reported on 12/18/2018 10/16/18 10/17/18  Shelly Coss, MD  apixaban (ELIQUIS) 5 MG TABS tablet  Take 1 tablet (5 mg total) by mouth 2 (two) times daily. Patient not taking: Reported on 12/18/2018 10/18/18 12/11/18  Shelly Coss, MD  atorvastatin (LIPITOR) 40 MG tablet Take 1 tablet (40 mg total) by mouth daily at 6 PM. Patient not taking: Reported on 12/18/2018 07/19/18   Scot Jun, FNP  blood glucose meter kit and supplies KIT Dispense based on patient and insurance preference. Use up to four times daily as directed. (FOR ICD-9 250.00, 250.01). 07/23/18   Scot Jun, FNP  Ferrous Fumarate (HEMOCYTE - 106 MG FE) 324 (106 Fe) MG TABS tablet Take 1 tablet (106 mg of iron total) by mouth daily. Patient not taking: Reported on 12/18/2018 07/25/18   Scot Jun, FNP  gabapentin (NEURONTIN) 100 MG capsule Take 1 capsule (100 mg total) by mouth 2 (two) times daily. 07/19/18   Scot Jun, FNP  insulin aspart (NOVOLOG) 100 UNIT/ML injection Inject 3 times daily based on sliding scale: 125=0 units 126-180=1 unit, 181-200=2 units 201-250 administer= 4 units 251-300 administer =6 units 301 or more 8 units-call provider Patient not taking: Reported on 12/18/2018 07/23/18   Scot Jun, FNP  insulin glargine (LANTUS) 100 UNIT/ML injection Inject 0.3 mLs (30 Units total) into the skin at bedtime. 10/16/18 02/13/19  Shelly Coss, MD  insulin lispro (HUMALOG) 100 UNIT/ML injection Inject 1-8 Units into the skin See admin instructions. Per Sliding Scale at bedtime: 126-180 1 unit 181-200 2 units 201-250 4 units 251-300 6 units 301 or greater 8 units    [provider]  Insulin Pen Needle (NOVOFINE) 30G X 8 MM MISC Inject insulin into skin per units prescribed. 07/19/17   Scot Jun, FNP  Insulin Syringe-Needle U-100 (TRUEPLUS INSULIN SYRINGE) 31G X 5/16" 0.3 ML MISC Use as directed 07/19/18   Scot Jun, FNP  levETIRAcetam (KEPPRA) 500 MG tablet Take 1 tablet (500 mg total) by mouth 2 (two) times daily. 07/19/18 12/18/18  Scot Jun, FNP    lisinopril (PRINIVIL,ZESTRIL) 10 MG tablet Take 1 tablet (10 mg total) by mouth daily. Patient not taking: Reported on 12/18/2018 10/16/18   Shelly Coss, MD  metoprolol tartrate (LOPRESSOR) 50 MG tablet Take 1 tablet (50 mg total) by mouth 2 (two) times daily. 07/19/18 12/18/18  Scot Jun, FNP  promethazine (PHENERGAN) 25 MG tablet Take 1 tablet (25 mg total) by mouth every 6 (six) hours as needed for nausea or vomiting. 07/19/18   Scot Jun, FNP  sevelamer carbonate (RENVELA) 800 MG tablet Take 800 mg by mouth 3 (three) times daily with meals.    [provider]  ziprasidone (GEODON) 40 MG capsule Take 40  mg by mouth 2 (two) times daily with a meal.    [provider]    Allergies    Penicillins, Tetracyclines & related, Morphine and related, Amoxicillin, Latex, and Tape  Review of Systems   Review of Systems  Unable to perform ROS: Psychiatric disorder    Physical Exam Updated Vital Signs There were no vitals taken for this visit.  Physical Exam Vitals and nursing note reviewed.  Constitutional:      General: She is not in acute distress.    Appearance: She is well-developed. She is not diaphoretic.  HENT:     Head: Normocephalic and atraumatic.  Eyes:     General: No scleral icterus.    Intraocular pressure: Left eye pressure is 22 mmHg. Measurements were taken using a handheld tonometer.    Extraocular Movements: Extraocular movements intact.     Conjunctiva/sclera:     Left eye: Left conjunctiva is injected. No chemosis or exudate.    Comments: Left pupil fixed and dilated   Cardiovascular:     Rate and Rhythm: Normal rate and regular rhythm.     Heart sounds: Normal heart sounds. No murmur. No friction rub. No gallop.   Pulmonary:     Effort: Pulmonary effort is normal. No respiratory distress.     Breath sounds: Normal breath sounds.  Abdominal:     General: Bowel sounds are normal. There is no distension.     Palpations: Abdomen  is soft. There is no mass.     Tenderness: There is no abdominal tenderness. There is no guarding.  Musculoskeletal:     Cervical back: Normal range of motion.  Skin:    General: Skin is warm and dry.  Neurological:     Mental Status: She is alert and oriented to person, place, and time.  Psychiatric:        Behavior: Behavior normal.     ED Results / Procedures / Treatments   Labs (all labs ordered are listed, but only abnormal results are displayed) Labs Reviewed - No data to display  EKG None  Radiology No results found.  Procedures Procedures (including critical care time)  Medications Ordered in ED Medications - No data to display  ED Course  I have reviewed the triage vital signs and the nursing notes.  Pertinent labs & imaging results that were available during my care of the patient were reviewed by me and considered in my medical decision making (see chart for details).    MDM Rules/Calculators/A&P                      59 year old female with left eye pain.  I was able to discuss the case with Dr. Rochele Pages on-call for ophthalmology at [facility] who was able to review the patient's records.  She is currently on atropine and Pred forte.  In shared decision making we decided to do an ER to ER transfer so she could be evaluated by ophthalmology she has a normal pressure so I doubt acute angle-closure glaucoma.  I do not see any evidence of purulence in the anterior chain chamber suggestive of endophthalmitis.  Patient will be transferred to [facility] ER for continuation of care and evaluation by the ophthalmology service with whom she had her cataract surgery. Final Clinical Impression(s) / ED Diagnoses Final diagnoses:  None    Rx / DC Orders ED Discharge Orders    None       Margarita Mail, PA-C  02/29/20 [place], [place], DO 02/29/20 2252

## 2020-02-29 NOTE — ED Provider Notes (Signed)
Medical screening examination/treatment/procedure(s) were conducted as a shared visit with non-physician practitioner(s) and myself.  I personally evaluated the patient during the encounter. Briefly, the patient is a 59 y.o. female schizophrenia, end-stage renal disease on hemodialysis, recent cataract surgery who presents the ED with left eye pain.  Normal vitals.  No fever.  Could not finish dialysis today due to left eye pain.  Left eye is injected.  Pupil is fixed and dilated.  However eye pressure is 22.  Likely eye findings secondary to recent cataract surgery.  Lab work shows no significant anemia, electrolyte abnormality.  No need for any further dialysis at this time.  Was able to reach out to ophthalmology at [facility] were patient had recent surgery and they have accepted the patient for transfer from ED to ED to further evaluate for causes of ongoing eye pain.  No obvious hyphema.  Possibly an iritis/uveitis.  Patient stable throughout our care and stable for transfer to emergency department at [facility] for further evaluation by ophthalmology.   EKG Interpretation None           Lennice Sites, DO 02/29/20 2045

## 2020-02-29 NOTE — ED Notes (Signed)
Report given to [place] and this RN called [facility] to advise Charge RN Sam that transport has arrived for patient. Paperwork passed off to transport along with all patient belongings. EMTALA completed and VS completed.

## 2020-02-29 NOTE — ED Notes (Signed)
Staffing called, no sitters available

## 2020-02-29 NOTE — ED Notes (Signed)
Pt trying to leave bed repeatedly, assissted to bathroom, still attempting. EDP would like sitter d/t high fall risk and frequent attempts to leave bed. Risk of injury to self

## 2020-02-29 NOTE — ED Triage Notes (Signed)
Pt presents from [facility] with EMS for eye pain related to cataract surgery on L eye on 02/18/20. Gets HD t-th-sat, only received 2 of 4 hrs today d/t eye pain.   EMS exam: able to stand/pivot to stretcher, unable to open eyes, L eye hazy and irritated, initial BP 154/64, f/u 116/68, CBG 123, strong radial pulses, negative orthostatics

## 2020-03-04 ENCOUNTER — Encounter: Payer: Self-pay | Admitting: Podiatry

## 2020-03-04 ENCOUNTER — Other Ambulatory Visit: Payer: Self-pay

## 2020-03-04 ENCOUNTER — Ambulatory Visit (INDEPENDENT_AMBULATORY_CARE_PROVIDER_SITE_OTHER): Payer: Medicare Other | Admitting: Podiatry

## 2020-03-04 DIAGNOSIS — L89620 Pressure ulcer of left heel, unstageable: Secondary | ICD-10-CM | POA: Diagnosis not present

## 2020-03-04 DIAGNOSIS — L89610 Pressure ulcer of right heel, unstageable: Secondary | ICD-10-CM | POA: Diagnosis not present

## 2020-03-04 NOTE — Progress Notes (Signed)
This patient presents to the office with pain on the back of her heels both feet for the last four weeks..  She says she has developed thick scaly skin on the back of both heels.  She says she has pain at night during sleep in the heels both feet.  Patient denied any drainage.  Patient has not provided any self treatment.  She presents to the office with a female caregiver.  She presents for evaluation and treatment.  Patient has history of acute/chronic kidney disease , type 2 diabetes and coagulation defect.  Patient is taking eliquis.  Vascular  Dorsalis pedis and posterior tibial pulses are weakly  palpable  B/L.  Capillary return  WNL.  Temperature gradient is  WNL.  Skin turgor  WNL  Sensorium  Deferred since patient was minimally responding.  Nail Exam  Patient has normal nails with no evidence of bacterial or fungal infection.  Orthopedic  Exam  Muscle tone and muscle strength  WNL.  No limitations of motion feet  B/L.  No crepitus or joint effusion noted.  Foot type is unremarkable and digits show no abnormalities.  Bony prominences are unremarkable.  Skin  No open lesions.  Normal skin texture and turgor.  Dry scaly peeling noted on the posterior calcaneal aspect  Heels  B/L.  Pressure lesions on posterior aspect heels  B/L.  IE>  Prescribe heel cushions both feet.  Told them to utilize pillows.   RTC prn.  Gardiner Barefoot DPM

## 2020-03-14 ENCOUNTER — Emergency Department (HOSPITAL_COMMUNITY)
Admission: EM | Admit: 2020-03-14 | Discharge: 2020-03-14 | Disposition: A | Discharge status: Home / Self Care | Payer: Medicare Other | Attending: Emergency Medicine | Admitting: Emergency Medicine

## 2020-03-14 ENCOUNTER — Encounter (HOSPITAL_COMMUNITY): Payer: Self-pay

## 2020-03-14 ENCOUNTER — Emergency Department (HOSPITAL_COMMUNITY): Payer: Medicare Other

## 2020-03-14 DIAGNOSIS — Z992 Dependence on renal dialysis: Secondary | ICD-10-CM | POA: Insufficient documentation

## 2020-03-14 DIAGNOSIS — Z7901 Long term (current) use of anticoagulants: Secondary | ICD-10-CM | POA: Diagnosis not present

## 2020-03-14 DIAGNOSIS — Y998 Other external cause status: Secondary | ICD-10-CM | POA: Diagnosis not present

## 2020-03-14 DIAGNOSIS — N186 End stage renal disease: Secondary | ICD-10-CM | POA: Diagnosis not present

## 2020-03-14 DIAGNOSIS — Y939 Activity, unspecified: Secondary | ICD-10-CM | POA: Diagnosis not present

## 2020-03-14 DIAGNOSIS — R519 Headache, unspecified: Secondary | ICD-10-CM | POA: Insufficient documentation

## 2020-03-14 DIAGNOSIS — W19XXXA Unspecified fall, initial encounter: Secondary | ICD-10-CM | POA: Diagnosis not present

## 2020-03-14 DIAGNOSIS — S060X9A Concussion with loss of consciousness of unspecified duration, initial encounter: Secondary | ICD-10-CM | POA: Diagnosis not present

## 2020-03-14 DIAGNOSIS — R52 Pain, unspecified: Secondary | ICD-10-CM

## 2020-03-14 DIAGNOSIS — S0990XA Unspecified injury of head, initial encounter: Secondary | ICD-10-CM | POA: Diagnosis present

## 2020-03-14 DIAGNOSIS — Y92129 Unspecified place in nursing home as the place of occurrence of the external cause: Secondary | ICD-10-CM | POA: Insufficient documentation

## 2020-03-14 LAB — COMPREHENSIVE METABOLIC PANEL
ALT: 16 U/L (ref 0–44)
AST: 16 U/L (ref 15–41)
Albumin: 3.5 g/dL (ref 3.5–5.0)
Alkaline Phosphatase: 93 U/L (ref 38–126)
Anion gap: 13 (ref 5–15)
BUN: 43 mg/dL — ABNORMAL HIGH (ref 6–20)
CO2: 29 mmol/L (ref 22–32)
Calcium: 9.4 mg/dL (ref 8.9–10.3)
Chloride: 94 mmol/L — ABNORMAL LOW (ref 98–111)
Creatinine, Ser: 7.53 mg/dL — ABNORMAL HIGH (ref 0.44–1.00)
GFR calc Af Amer: 6 mL/min — ABNORMAL LOW (ref >60)
GFR calc non Af Amer: 5 mL/min — ABNORMAL LOW (ref >60)
Glucose, Bld: 82 mg/dL (ref 70–99)
Potassium: 3.9 mmol/L (ref 3.5–5.1)
Sodium: 136 mmol/L (ref 135–145)
Total Bilirubin: 0.5 mg/dL (ref 0.3–1.2)
Total Protein: 7.9 g/dL (ref 6.5–8.1)

## 2020-03-14 LAB — CBC
HCT: 31 % — ABNORMAL LOW (ref 36.0–46.0)
Hemoglobin: 9.4 g/dL — ABNORMAL LOW (ref 12.0–15.0)
MCH: 25.5 pg — ABNORMAL LOW (ref 26.0–34.0)
MCHC: 30.3 g/dL (ref 30.0–36.0)
MCV: 84.2 fL (ref 80.0–100.0)
Platelets: 226 10*3/uL (ref 150–400)
RBC: 3.68 MIL/uL — ABNORMAL LOW (ref 3.87–5.11)
RDW: 17.6 % — ABNORMAL HIGH (ref 11.5–15.5)
WBC: 7.7 10*3/uL (ref 4.0–10.5)
nRBC: 0 % (ref 0.0–0.2)

## 2020-03-14 LAB — CBG MONITORING, ED: Glucose-Capillary: 78 mg/dL (ref 70–99)

## 2020-03-14 LAB — PROTIME-INR
INR: 1.3 — ABNORMAL HIGH (ref 0.8–1.2)
Prothrombin Time: 15.4 seconds — ABNORMAL HIGH (ref 11.4–15.2)

## 2020-03-14 LAB — I-STAT CHEM 8, ED
BUN: 45 mg/dL — ABNORMAL HIGH (ref 6–20)
Calcium, Ion: 1.14 mmol/L — ABNORMAL LOW (ref 1.15–1.40)
Chloride: 95 mmol/L — ABNORMAL LOW (ref 98–111)
Creatinine, Ser: 8.1 mg/dL — ABNORMAL HIGH (ref 0.44–1.00)
Glucose, Bld: 78 mg/dL (ref 70–99)
HCT: 28 % — ABNORMAL LOW (ref 36.0–46.0)
Hemoglobin: 9.5 g/dL — ABNORMAL LOW (ref 12.0–15.0)
Potassium: 3.8 mmol/L (ref 3.5–5.1)
Sodium: 136 mmol/L (ref 135–145)
TCO2: 31 mmol/L (ref 22–32)

## 2020-03-14 LAB — SAMPLE TO BLOOD BANK

## 2020-03-14 NOTE — ED Notes (Signed)
ptar called 

## 2020-03-14 NOTE — Progress Notes (Signed)
Orthopedic Tech Progress Note Patient Details:  Velencia Lenart 1961-07-18 [id number] Level 2 Trauma Patient ID: [name], female   DOB: 06-Sep-1961, 59 y.o.   MRN: [id number]   Chip Boer 03/14/2020, 12:44 AM

## 2020-03-14 NOTE — ED Notes (Signed)
RN called report to [employer] at [facility].

## 2020-03-14 NOTE — ED Provider Notes (Signed)
[facility] EMERGENCY DEPARTMENT Provider Note   CSN: [id number] Arrival date & time: 03/14/20  0040     History Chief complaint - fall Level 5 caveat due to acuity of condition Amber Lara is a 59 y.o. female.  The history is provided by the patient. The history is limited by the condition of the patient.   Patient with history of schizophrenia, renal failure, presents after unwitnessed fall. Patient lives in a nursing home. Patient was found on the floor. Patient takes Xarelto. Patient reporting headache. Patient receives dialysis Tuesday/Thursday/Saturday. No other details are known on arrival.      PMH-ESRD, schizophrenia Soc hx - unknown OB History   No obstetric history on file.     No family history on file.  Social History   Tobacco Use  . Smoking status: Not on file  Substance Use Topics  . Alcohol use: Not on file  . Drug use: Not on file    Home Medications Prior to Admission medications   Not on File    Allergies    Patient has no allergy information on record.  Review of Systems   Review of Systems  Unable to perform ROS: Acuity of condition    Physical Exam Updated Vital Signs BP (!) 148/88   Resp 18   Ht 1.6 m (5\' 3" )   Wt 88.9 kg   SpO2 98%   BMI 34.72 kg/m   Physical Exam CONSTITUTIONAL: Well developed HEAD: Normocephalic/atraumatic EYES: EOMI ENMT: Mucous membranes moist, no facial trauma NECK: Cervical collar in place SPINE/BACK: No C/T/L tenderness CV: S1/S2 noted LUNGS: Lungs are clear to auscultation bilaterally, no apparent distress Chest-no crepitus ABDOMEN: soft, nontender NEURO: Pt is awake/alert. Moves all extremities x4. Patient is slow to respond. GCS 14 EXTREMITIES: pulses normal/equal, full ROM, no deformities, pelvis stable Dialysis access to right arm, thrill noted SKIN: warm, color normal PSYCH: Unable to assess ED Results / Procedures / Treatments   Labs (all labs ordered are listed,  but only abnormal results are displayed) Labs Reviewed  COMPREHENSIVE METABOLIC PANEL - Abnormal; Notable for the following components:      Result Value   Chloride 94 (*)    BUN 43 (*)    Creatinine, Ser 7.53 (*)    GFR calc non Af Amer 5 (*)    GFR calc Af Amer 6 (*)    All other components within normal limits  CBC - Abnormal; Notable for the following components:   RBC 3.68 (*)    Hemoglobin 9.4 (*)    HCT 31.0 (*)    MCH 25.5 (*)    RDW 17.6 (*)    All other components within normal limits  PROTIME-INR - Abnormal; Notable for the following components:   Prothrombin Time 15.4 (*)    INR 1.3 (*)    All other components within normal limits  I-STAT CHEM 8, ED - Abnormal; Notable for the following components:   Chloride 95 (*)    BUN 45 (*)    Creatinine, Ser 8.10 (*)    Calcium, Ion 1.14 (*)    Hemoglobin 9.5 (*)    HCT 28.0 (*)    All other components within normal limits  CBG MONITORING, ED  SAMPLE TO BLOOD BANK    EKG EKG Interpretation  Date/Time:  Saturday March 14 2020 01:46:32 EDT Ventricular Rate:  67 PR Interval:    QRS Duration: 95 QT Interval:  425 QTC Calculation: 449 R Axis:   77 Text  Interpretation: Sinus or ectopic atrial rhythm Prolonged PR interval Interpretation limited secondary to artifact Confirmed by Ripley Fraise [phone]) on 03/14/2020 1:50:06 AM   Radiology CT HEAD WO CONTRAST  Result Date: 03/14/2020 CLINICAL DATA:  Unwitnessed fall, headache, neck pain, on Xarelto EXAM: CT HEAD WITHOUT CONTRAST CT CERVICAL SPINE WITHOUT CONTRAST TECHNIQUE: Multidetector CT imaging of the head and cervical spine was performed following the standard protocol without intravenous contrast. Multiplanar CT image reconstructions of the cervical spine were also generated. COMPARISON:  CT head dated 05/28/2019 FINDINGS: CT HEAD FINDINGS Brain: No evidence of acute infarction, hemorrhage, hydrocephalus, extra-axial collection or mass lesion/mass effect. Old left  frontal, parietal, temporal, and occipital infarcts. Ex vacuo dilatation of the left lateral ventricle. Subcortical white matter and periventricular small vessel ischemic changes. Vascular: Intracranial atherosclerosis. Skull: Normal. Negative for fracture or focal lesion. Sinuses/Orbits: The visualized paranasal sinuses are essentially clear. The mastoid air cells are unopacified. Other: None. CT CERVICAL SPINE FINDINGS Alignment: Straightening of the cervical spine, likely positional. Skull base and vertebrae: No acute fracture. No primary bone lesion or focal pathologic process. Soft tissues and spinal canal: No prevertebral fluid or swelling. No visible canal hematoma. Disc levels: Vertebral body heights and intervertebral disc spaces are maintained. Spinal canal is patent. Upper chest: Visualized lung apices are clear. Other: Visualized thyroid is unremarkable. IMPRESSION: No evidence of acute intracranial abnormality. Multiple left-sided infarcts. Small vessel ischemic changes. No evidence of traumatic injury to the cervical spine. Electronically Signed   By: Julian Hy M.D.   On: 03/14/2020 01:42   CT CERVICAL SPINE WO CONTRAST  Result Date: 03/14/2020 CLINICAL DATA:  Unwitnessed fall, headache, neck pain, on Xarelto EXAM: CT HEAD WITHOUT CONTRAST CT CERVICAL SPINE WITHOUT CONTRAST TECHNIQUE: Multidetector CT imaging of the head and cervical spine was performed following the standard protocol without intravenous contrast. Multiplanar CT image reconstructions of the cervical spine were also generated. COMPARISON:  CT head dated 05/28/2019 FINDINGS: CT HEAD FINDINGS Brain: No evidence of acute infarction, hemorrhage, hydrocephalus, extra-axial collection or mass lesion/mass effect. Old left frontal, parietal, temporal, and occipital infarcts. Ex vacuo dilatation of the left lateral ventricle. Subcortical white matter and periventricular small vessel ischemic changes. Vascular: Intracranial  atherosclerosis. Skull: Normal. Negative for fracture or focal lesion. Sinuses/Orbits: The visualized paranasal sinuses are essentially clear. The mastoid air cells are unopacified. Other: None. CT CERVICAL SPINE FINDINGS Alignment: Straightening of the cervical spine, likely positional. Skull base and vertebrae: No acute fracture. No primary bone lesion or focal pathologic process. Soft tissues and spinal canal: No prevertebral fluid or swelling. No visible canal hematoma. Disc levels: Vertebral body heights and intervertebral disc spaces are maintained. Spinal canal is patent. Upper chest: Visualized lung apices are clear. Other: Visualized thyroid is unremarkable. IMPRESSION: No evidence of acute intracranial abnormality. Multiple left-sided infarcts. Small vessel ischemic changes. No evidence of traumatic injury to the cervical spine. Electronically Signed   By: Julian Hy M.D.   On: 03/14/2020 01:42   DG Chest Port 1 View  Result Date: 03/14/2020 CLINICAL DATA:  Unwitnessed fall. EXAM: PORTABLE CHEST 1 VIEW COMPARISON:  12/26/2019 FINDINGS: Low lung volumes limit assessment.The cardiomediastinal contours are normal for technique and degree of inspiration. Mild bibasilar atelectasis. Pulmonary vasculature is normal. No consolidation, pleural effusion, or pneumothorax. No acute osseous abnormalities are seen. IMPRESSION: Low lung volumes with bibasilar atelectasis. Electronically Signed   By: Keith Rake M.D.   On: 03/14/2020 01:07    Procedures Procedures   Medications Ordered in ED  Medications - No data to display  ED Course  I have reviewed the triage vital signs and the nursing notes.  Pertinent labs & imaging results that were available during my care of the patient were reviewed by me and considered in my medical decision making (see chart for details).    MDM Rules/Calculators/A&P                      12:54 AM Patient presents as a level 2 trauma. She was found on the  floor of the nursing home. She is reporting headache and she is on anticoagulation. I am unable to clear her C-spine. Due to headache and fall, will proceed with CT head and C-spine. Labs and chest x-ray are also pending. 2:44 AM No acute traumatic injuries. suspect Labs near baseline.  Patient reports she can ambulate with assistance.  I spoke to the nurse at [facility].  She does not know patient well and is unsure of her ambulatory status.  She reports that patient was found near the bed and reporting a headache after falling.  The fall was unwitnessed. There is no other signs of acute traumatic injury.  No signs of any orthopedic injury I feel she is appropriate for discharge back to facility.  No emergent indication for dialysis Final Clinical Impression(s) / ED Diagnoses Final diagnoses:  Pain  Fall, initial encounter  Concussion with loss of consciousness, initial encounter  ESRD (end stage renal disease) ([place])    Rx / DC Orders ED Discharge Orders    None       Ripley Fraise, MD 03/14/20 0246

## 2020-03-14 NOTE — ED Triage Notes (Addendum)
Pt came in [facility] from [place] post unwitnessed fall and on xerllto. Pt has a hx of schizophrenia and thought she was going to play with the kids and fell. EMS reports of no obvious injuries. Pt complaining of a headache. EMS placed pt in [facility]. Pt is Right Arm Restricted d/t T/Thur/Sat dialysis.

## 2020-03-16 ENCOUNTER — Encounter: Payer: Self-pay | Admitting: Podiatry

## 2020-03-17 ENCOUNTER — Emergency Department (HOSPITAL_COMMUNITY): Payer: Medicare Other

## 2020-03-17 ENCOUNTER — Encounter: Payer: Self-pay | Admitting: Nephrology

## 2020-03-17 ENCOUNTER — Emergency Department (HOSPITAL_COMMUNITY)
Admission: EM | Admit: 2020-03-17 | Discharge: 2020-03-18 | Disposition: A | Discharge status: Home / Self Care | Payer: Medicare Other | Attending: Emergency Medicine | Admitting: Emergency Medicine

## 2020-03-17 ENCOUNTER — Encounter (HOSPITAL_COMMUNITY): Payer: Self-pay

## 2020-03-17 ENCOUNTER — Other Ambulatory Visit: Payer: Self-pay

## 2020-03-17 DIAGNOSIS — Z794 Long term (current) use of insulin: Secondary | ICD-10-CM | POA: Diagnosis not present

## 2020-03-17 DIAGNOSIS — I120 Hypertensive chronic kidney disease with stage 5 chronic kidney disease or end stage renal disease: Secondary | ICD-10-CM | POA: Diagnosis not present

## 2020-03-17 DIAGNOSIS — N186 End stage renal disease: Secondary | ICD-10-CM | POA: Diagnosis not present

## 2020-03-17 DIAGNOSIS — Z8673 Personal history of transient ischemic attack (TIA), and cerebral infarction without residual deficits: Secondary | ICD-10-CM | POA: Insufficient documentation

## 2020-03-17 DIAGNOSIS — R072 Precordial pain: Secondary | ICD-10-CM | POA: Diagnosis not present

## 2020-03-17 DIAGNOSIS — R0789 Other chest pain: Secondary | ICD-10-CM

## 2020-03-17 DIAGNOSIS — N183 Chronic kidney disease, stage 3 unspecified: Secondary | ICD-10-CM | POA: Insufficient documentation

## 2020-03-17 DIAGNOSIS — Z91040 Latex allergy status: Secondary | ICD-10-CM | POA: Insufficient documentation

## 2020-03-17 DIAGNOSIS — Z7901 Long term (current) use of anticoagulants: Secondary | ICD-10-CM | POA: Diagnosis not present

## 2020-03-17 DIAGNOSIS — Z79899 Other long term (current) drug therapy: Secondary | ICD-10-CM | POA: Diagnosis not present

## 2020-03-17 DIAGNOSIS — Z720 Tobacco use: Secondary | ICD-10-CM | POA: Insufficient documentation

## 2020-03-17 DIAGNOSIS — E1122 Type 2 diabetes mellitus with diabetic chronic kidney disease: Secondary | ICD-10-CM | POA: Diagnosis not present

## 2020-03-17 DIAGNOSIS — Z992 Dependence on renal dialysis: Secondary | ICD-10-CM | POA: Diagnosis not present

## 2020-03-17 LAB — CBC
HCT: 29.6 % — ABNORMAL LOW (ref 36.0–46.0)
Hemoglobin: 9 g/dL — ABNORMAL LOW (ref 12.0–15.0)
MCH: 25.7 pg — ABNORMAL LOW (ref 26.0–34.0)
MCHC: 30.4 g/dL (ref 30.0–36.0)
MCV: 84.6 fL (ref 80.0–100.0)
Platelets: 262 10*3/uL (ref 150–400)
RBC: 3.5 MIL/uL — ABNORMAL LOW (ref 3.87–5.11)
RDW: 18.1 % — ABNORMAL HIGH (ref 11.5–15.5)
WBC: 7.4 10*3/uL (ref 4.0–10.5)
nRBC: 0 % (ref 0.0–0.2)

## 2020-03-17 LAB — BASIC METABOLIC PANEL
Anion gap: 12 (ref 5–15)
BUN: 48 mg/dL — ABNORMAL HIGH (ref 6–20)
CO2: 28 mmol/L (ref 22–32)
Calcium: 8.7 mg/dL — ABNORMAL LOW (ref 8.9–10.3)
Chloride: 100 mmol/L (ref 98–111)
Creatinine, Ser: 7.35 mg/dL — ABNORMAL HIGH (ref 0.44–1.00)
GFR calc Af Amer: 6 mL/min — ABNORMAL LOW (ref >60)
GFR calc non Af Amer: 6 mL/min — ABNORMAL LOW (ref >60)
Glucose, Bld: 151 mg/dL — ABNORMAL HIGH (ref 70–99)
Potassium: 4.2 mmol/L (ref 3.5–5.1)
Sodium: 140 mmol/L (ref 135–145)

## 2020-03-17 LAB — TROPONIN I (HIGH SENSITIVITY)
Troponin I (High Sensitivity): <2 ng/L (ref <18)
Troponin I (High Sensitivity): 3 ng/L (ref <18)

## 2020-03-17 MED ORDER — TRAMADOL HCL 50 MG PO TABS
50.0000 mg | ORAL_TABLET | Freq: Once | ORAL | Status: AC
  Administered 2020-03-17: 50 mg via ORAL
  Filled 2020-03-17: qty 1

## 2020-03-17 MED ORDER — TRAMADOL HCL 50 MG PO TABS: 50.0000 mg | ORAL_TABLET | Freq: Four times a day (QID) | ORAL | 0 refills | Status: DC | PRN

## 2020-03-17 NOTE — ED Triage Notes (Signed)
Pt bib ems from dialysis, pt lives at [facility]. Pt c.o chest pain 1 hr into dialysis session. Pt given 324 ASA. VSS. Pt alert

## 2020-03-17 NOTE — Discharge Instructions (Signed)
Take tylenol for pain   Take tramadol for severe pain   Go to dialysis as scheduled.  Follow-up with your doctor.  Return to ER if you have worse chest pain or shortness of breath, falls.

## 2020-03-17 NOTE — ED Provider Notes (Signed)
[facility] EMERGENCY DEPARTMENT Provider Note   CSN: [id number] Arrival date & time: 03/17/20  1353     History Chief Complaint  Patient presents with  . Chest Pain    Amber Lara is a 59 y.o. female hx of CKD, DM, HTN, ESRD on dialysis, who presented with chest pain.  Had unwitnessed fall several days ago and was seen in the ER.  She had no chest pain at that time and had a negative chest x-ray and a normal CT head and neck.  Patient states that about an hour into dialysis, she had some substernal chest pain.  She states that the pain is worse after movement.  Denies any worsening shortness of breath.  Denies any fevers.  Patient denies any additional fall since several days ago.  The history is provided by the patient.       Past Medical History:  Diagnosis Date  . Arthritis   . Chronic anemia   . CKD (chronic kidney disease), stage V ([place])   . Diabetes mellitus ([place])   . History of stroke   . Hypertension   . Obesity   . Psychosis ([place])    a. prior adm in [place] ([place]) in 2015 where pt was admitted with hallucinations, seeing men in black outfits, family members on a non-existent camera, hearing voices and seeing animals - UDS positive for methamphetamines at that time and patient stated she believed someone had put this in her body.  . Seizures [facility])     Patient Active Problem List   Diagnosis Date Noted  . History of stroke   . Pressure sore on heel, left, unstageable ([place]) 03/04/2020  . Pressure sore on heel, right, unstageable ([place]) 03/04/2020  . Acute renal failure (ARF) ([place]) 10/09/2018  . Acute deep vein thrombosis (DVT) of femoral vein of right lower extremity ([place]) 10/05/2018  . ESRD (end stage renal disease) ([place])   . CKD (chronic kidney disease), stage V ([place]) 06/04/2018  . Acute on chronic renal failure ([place]) 08/03/2017  . Seizure ([place]) 08/03/2017  . Hyperkalemia 07/14/2017  . Acute kidney injury superimposed on  chronic kidney disease ([place]) 07/14/2017  . Type 2 diabetes mellitus with hyperglycemia ([place]) 06/23/2017  . Normochromic normocytic anemia 06/23/2017  . Essential hypertension 06/23/2017    Past Surgical History:  Procedure Laterality Date  . ABDOMINAL HYSTERECTOMY    . AV FISTULA PLACEMENT Right 06/07/2018   Procedure: CREATION OF RIGHT BRACHIOCEPHALIC ARTERIOVENOUS FISTULA;  Surgeon: Serafina Mitchell, MD;  Location: [facility];  Service: Vascular;  Laterality: Right;  . IR DIALY SHUNT INTRO [place] W/IMG RIGHT Right 10/09/2018  . IR US GUIDE VASC ACCESS RIGHT  10/09/2018     OB History   No obstetric history on file.     Family History  Problem Relation Age of Onset  . Diabetes Mellitus II Mother   . CAD Mother        First MI in her 53s, passed away before age 58  . Renal Disease Mother   . Diabetes Mellitus II Sister   . CAD Sister        Stents in her early 12s  . Renal Disease Sister   . Diabetes Mellitus II Brother     Social History   Tobacco Use  . Smoking status: Current Some Day Smoker  . Smokeless tobacco: Never Used  Substance Use Topics  . Alcohol use: No  . Drug use: No    Home  Medications Prior to Admission medications   Medication Sig Start Date End Date Taking? Authorizing Provider  acetaminophen (TYLENOL) 325 MG tablet Take 2 tablets (650 mg total) by mouth every 6 (six) hours as needed for mild pain or moderate pain (or Fever >/= 101). 07/15/17   Hongalgi, Lenis Dickinson, MD  amLODipine (NORVASC) 10 MG tablet Take 1 tablet (10 mg total) by mouth daily. 07/23/18   Scot Jun, FNP  apixaban (ELIQUIS) 5 MG TABS tablet Take 2 tablets (10 mg total) by mouth 2 (two) times daily for 1 day. Patient not taking: Reported on 12/18/2018 10/16/18 10/17/18  Shelly Coss, MD  apixaban (ELIQUIS) 5 MG TABS tablet Take 1 tablet (5 mg total) by mouth 2 (two) times daily. Patient not taking: Reported on 12/18/2018 10/18/18 12/11/18  Shelly Coss, MD    ARIPiprazole (ABILIFY) 5 MG tablet Take 5 mg by mouth daily.    [provider]  atorvastatin (LIPITOR) 40 MG tablet Take 1 tablet (40 mg total) by mouth daily at 6 PM. 07/19/18   Scot Jun, FNP  blood glucose meter kit and supplies KIT Dispense based on patient and insurance preference. Use up to four times daily as directed. (FOR ICD-9 250.00, 250.01). 07/23/18   Scot Jun, FNP  Ferrous Fumarate (HEMOCYTE - 106 MG FE) 324 (106 Fe) MG TABS tablet Take 1 tablet (106 mg of iron total) by mouth daily. 07/25/18   Scot Jun, FNP  gabapentin (NEURONTIN) 100 MG capsule Take 1 capsule (100 mg total) by mouth 2 (two) times daily. 07/19/18   Scot Jun, FNP  insulin aspart (NOVOLOG) 100 UNIT/ML injection Inject 3 times daily based on sliding scale: 125=0 units 126-180=1 unit, 181-200=2 units 201-250 administer= 4 units 251-300 administer =6 units 301 or more 8 units-call provider Patient not taking: Reported on 12/18/2018 07/23/18   Scot Jun, FNP  insulin glargine (LANTUS) 100 UNIT/ML injection Inject 0.3 mLs (30 Units total) into the skin at bedtime. 10/16/18 02/29/20  Shelly Coss, MD  insulin lispro (HUMALOG) 100 UNIT/ML injection Inject 1-8 Units into the skin See admin instructions. Per Sliding Scale at bedtime: 126-180 1 unit 181-200 2 units 201-250 4 units 251-300 6 units 301 or greater 8 units    [provider]  Insulin Pen Needle (NOVOFINE) 30G X 8 MM MISC Inject insulin into skin per units prescribed. 07/19/17   Scot Jun, FNP  Insulin Syringe-Needle U-100 (TRUEPLUS INSULIN SYRINGE) 31G X 5/16" 0.3 ML MISC Use as directed 07/19/18   Scot Jun, FNP  levETIRAcetam (KEPPRA) 500 MG tablet Take 1 tablet (500 mg total) by mouth 2 (two) times daily. 07/19/18 12/18/18  Scot Jun, FNP  lisinopril (PRINIVIL,ZESTRIL) 10 MG tablet Take 1 tablet (10 mg total) by mouth daily. Patient not taking: Reported on 12/18/2018  10/16/18   Shelly Coss, MD  metoprolol tartrate (LOPRESSOR) 50 MG tablet Take 1 tablet (50 mg total) by mouth 2 (two) times daily. 07/19/18 12/18/18  Scot Jun, FNP  Multiple Vitamins-Minerals (CERTAGEN PO) Take 1 tablet by mouth daily.    [provider]  promethazine (PHENERGAN) 25 MG tablet Take 1 tablet (25 mg total) by mouth every 6 (six) hours as needed for nausea or vomiting. 07/19/18   Scot Jun, FNP  sevelamer carbonate (RENVELA) 800 MG tablet Take 800 mg by mouth 3 (three) times daily with meals.    [provider]  torsemide (DEMADEX) 100 MG tablet Take 100 mg by  mouth daily.    [provider]  ziprasidone (GEODON) 40 MG capsule Take 40 mg by mouth 2 (two) times daily with a meal.    [provider]    Allergies    Penicillins, Tetracyclines & related, Morphine and related, Amoxicillin, Latex, and Tape  Review of Systems   Review of Systems  Cardiovascular: Positive for chest pain.  All other systems reviewed and are negative.   Physical Exam Updated Vital Signs BP (!) 156/74   Pulse (!) 58   Temp 98.8 F (37.1 C) (Oral)   Resp 14   SpO2 99%   Physical Exam Vitals and nursing note reviewed.  Constitutional:      Comments: Chronically ill   HENT:     Head: Normocephalic.  Eyes:     Extraocular Movements: Extraocular movements intact.     Pupils: Pupils are equal, round, and reactive to light.  Cardiovascular:     Rate and Rhythm: Normal rate and regular rhythm.     Heart sounds: Normal heart sounds.  Pulmonary:     Effort: Pulmonary effort is normal.     Breath sounds: Normal breath sounds.  Chest:     Comments: Lower chest wall tenderness  Abdominal:     General: Bowel sounds are normal. There is no abdominal bruit.     Palpations: Abdomen is soft.  Musculoskeletal:        General: Normal range of motion.     Cervical back: Normal range of motion and neck supple.  Skin:    General: Skin is warm.      Capillary Refill: Capillary refill takes less than 2 seconds.  Neurological:     General: No focal deficit present.     Mental Status: She is alert and oriented to person, place, and time.  Psychiatric:        Mood and Affect: Mood normal.        Behavior: Behavior normal.     ED Results / Procedures / Treatments   Labs (all labs ordered are listed, but only abnormal results are displayed) Labs Reviewed  BASIC METABOLIC PANEL - Abnormal; Notable for the following components:      Result Value   Glucose, Bld 151 (*)    BUN 48 (*)    Creatinine, Ser 7.35 (*)    Calcium 8.7 (*)    GFR calc non Af Amer 6 (*)    GFR calc Af Amer 6 (*)    All other components within normal limits  CBC - Abnormal; Notable for the following components:   RBC 3.50 (*)    Hemoglobin 9.0 (*)    HCT 29.6 (*)    MCH 25.7 (*)    RDW 18.1 (*)    All other components within normal limits  TROPONIN I (HIGH SENSITIVITY)  TROPONIN I (HIGH SENSITIVITY)    EKG EKG Interpretation  Date/Time:  Tuesday March 17 2020 16:28:50 EDT Ventricular Rate:  60 PR Interval:  250 QRS Duration: 90 QT Interval:  448 QTC Calculation: 448 R Axis:   32 Text Interpretation: Sinus rhythm with 1st degree A-V block Cannot rule out Anterior infarct , age undetermined Abnormal ECG No significant change since last tracing Confirmed by Wandra Arthurs ([id number]) on 03/17/2020 8:39:32 PM   Radiology DG Chest 2 View  Result Date: 03/17/2020 CLINICAL DATA:  Chest pain. EXAM: CHEST - 2 VIEW COMPARISON:  March 14, 2020 FINDINGS: Mildly decreased lung volumes are seen which is likely secondary to  the degree of patient inspiration. There is no evidence of acute infiltrate, pleural effusion or pneumothorax. The heart size and mediastinal contours are within normal limits. The visualized skeletal structures are unremarkable. IMPRESSION: No active cardiopulmonary disease. Electronically Signed   By: Virgina Norfolk M.D.   On: 03/17/2020 15:27     Procedures Procedures (including critical care time)  Medications Ordered in ED Medications  traMADol (ULTRAM) tablet 50 mg (50 mg Oral Given 03/17/20 2031)    ED Course  I have reviewed the triage vital signs and the nursing notes.  Pertinent labs & imaging results that were available during my care of the patient were reviewed by me and considered in my medical decision making (see chart for details).    MDM Rules/Calculators/A&P                      Ary Krupka is a 59 y.o. female here with chest pain.  Pain about an hour into dialysis.  Patient appears to have reproducible tenderness.  Patient has negative troponin x2.  Her chest x-ray is unremarkable.  We will give some tramadol for pain.  Stable for discharge back to facility.  Final Clinical Impression(s) / ED Diagnoses Final diagnoses:  None    Rx / DC Orders ED Discharge Orders    None       Drenda Freeze, MD 03/17/20 2051

## 2020-03-18 NOTE — ED Notes (Signed)
Called [facility] for ETA--non given--Amber Lara

## 2020-03-18 NOTE — ED Notes (Signed)
Report called to [facility] RN.  °

## 2020-03-18 NOTE — ED Notes (Signed)
[facility] at facility for transport 

## 2020-03-24 ENCOUNTER — Other Ambulatory Visit: Payer: Self-pay

## 2020-03-24 ENCOUNTER — Non-Acute Institutional Stay: Payer: Medicare Other | Admitting: Hospice

## 2020-03-24 DIAGNOSIS — Z515 Encounter for palliative care: Secondary | ICD-10-CM

## 2020-03-24 DIAGNOSIS — N186 End stage renal disease: Secondary | ICD-10-CM

## 2020-03-24 NOTE — Progress Notes (Signed)
[facility] Palliative Care Consult Note Telephone: [phone]  Fax: [phone]  PATIENT NAME: Amber Lara DOB: 30-Jun-1961 MRN: [id number]  PRIMARY CARE PROVIDER:   Dr. Vernell Morgans REFERRING PROVIDER:  Dr. Vernell Morgans RESPONSIBLE PARTY:  Elsie Amis - daughter [phone]    RECOMMENDATIONS/PLAN:   Advance Care Planning/Goals of Care: Visit at the request of  Dr. Ethelene Browns for palliative consult. Visit consisted of building trust and discussions on Palliative Medicine as specialized medical care for people living with serious illness, aimed at facilitating better quality of life through symptoms relief, assisting with advance care plan and establishing goals of care. Extensive discussion with responsible party on [place], goals of care. She said she wants patient changed to DO NOT RESUSCITATE.  Responsible party is also interested in hospice service for patient when she qualifies for it.  NP signed DNR form, uploaded to epic.  She requested the DNR form be mailed to her so she can make a copy before taking it to the facility.  Goals of care include to maximize quality of life and symptom management.  Visit consisted of counseling and education dealing with the complex and emotionally intense issues of symptom management and palliative care in the setting of serious and potentially life-threatening illness.  Responsible party shared her challenges and she takes care of multiple family members with different life-threatening illnesses.  Therapeutic listening and ample emotional support provided. Palliative care team will continue to support patient, patient's family, and medical team. Symptom management: Patient with end-stage renal disease; dialysis Tue Thur and Sat, well-tolerated. 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 Patient is incontinent of bladder and  bowel, mostly bed/wheelchair bound, 1 person assist for transfers.  Patient sleeping most of the visit; nursing reports this is her baseline when she stays up at night.  FLACC 0.  Responsible party reported that patient had a fall last night and was expected to have x-ray done. Xray was negative of fracture.  No medical acuity at this time.  Encouraged ongoing nursing care. Follow up: Palliative care will continue to follow patient for goals of care clarification and symptom management. I spent 1 hour and 30 minutes providing this consultation; time iincludes time spent with patient/family, chart review, provider coordination,  and documentation. More than 50% of the time in this consultation was spent on coordinating communication  HISTORY OF PRESENT ILLNESS:  Amber Lara is a 59 y.o. year old female with multiple medical problems including this organized schizophrenia, type 2 diabetes mellitus, paranoid personality disorder.  End-stage renal disease on dialysis. Palliative Care was asked to help address goals of care.   CODE STATUS: DNR  PPS: 40% HOSPICE ELIGIBILITY/DIAGNOSIS: TBD  PAST MEDICAL HISTORY:  Past Medical History:  Diagnosis Date  . Arthritis   . Chronic anemia   . CKD (chronic kidney disease), stage V ([place])   . Diabetes mellitus ([place])   . History of stroke   . Hypertension   . Obesity   . Psychosis ([place])    a. prior adm in [place] ([place] in 2015 where pt was admitted with hallucinations, seeing men in black outfits, family members on a non-existent camera, hearing voices and seeing animals - UDS positive for methamphetamines at that time and patient stated she believed someone had put this in her body.  . Seizures ([place])     SOCIAL HX:  Social History   Tobacco  Use  . Smoking status: Current Some Day Smoker  . Smokeless tobacco: Never Used  Substance Use Topics  . Alcohol use: No    ALLERGIES:  Allergies  Allergen Reactions  . Penicillins  Anaphylaxis and Hives    Has patient had a PCN reaction causing immediate rash, facial/tongue/throat swelling, SOB or lightheadedness with hypotension: yes Has patient had a PCN reaction causing severe rash involving mucus membranes or skin n24} If all of the above answers are "NO", then may proceed with Cephalosporin use.ecrosis: no Has patient had a PCN reaction that required hospitalization: no Has patient had a PCN reaction occurring within the last 10 years:no  . Tetracyclines & Related Anaphylaxis and Hives  . Morphine And Related Hives  . Amoxicillin     Same as penicillin  . Latex Itching and Rash  . Tape Itching and Rash     PERTINENT MEDICATIONS:  Outpatient Encounter Medications as of 03/24/2020  Medication Sig  . acetaminophen (TYLENOL) 325 MG tablet Take 2 tablets (650 mg total) by mouth every 6 (six) hours as needed for mild pain or moderate pain (or Fever >/= 101).  Marland Kitchen amLODipine (NORVASC) 10 MG tablet Take 1 tablet (10 mg total) by mouth daily.  Marland Kitchen apixaban (ELIQUIS) 5 MG TABS tablet Take 2 tablets (10 mg total) by mouth 2 (two) times daily for 1 day. (Patient not taking: Reported on 12/18/2018)  . apixaban (ELIQUIS) 5 MG TABS tablet Take 1 tablet (5 mg total) by mouth 2 (two) times daily. (Patient not taking: Reported on 12/18/2018)  . ARIPiprazole (ABILIFY) 5 MG tablet Take 5 mg by mouth daily.  Marland Kitchen atorvastatin (LIPITOR) 40 MG tablet Take 1 tablet (40 mg total) by mouth daily at 6 PM.  . blood glucose meter kit and supplies KIT Dispense based on patient and insurance preference. Use up to four times daily as directed. (FOR ICD-9 250.00, 250.01).  . Ferrous Fumarate (HEMOCYTE - 106 MG FE) 324 (106 Fe) MG TABS tablet Take 1 tablet (106 mg of iron total) by mouth daily.  Marland Kitchen gabapentin (NEURONTIN) 100 MG capsule Take 1 capsule (100 mg total) by mouth 2 (two) times daily.  . insulin aspart (NOVOLOG) 100 UNIT/ML injection Inject 3 times daily based on sliding scale: 125=0 units  126-180=1 unit, 181-200=2 units 201-250 administer= 4 units 251-300 administer =6 units 301 or more 8 units-call provider (Patient not taking: Reported on 12/18/2018)  . insulin glargine (LANTUS) 100 UNIT/ML injection Inject 0.3 mLs (30 Units total) into the skin at bedtime.  . insulin lispro (HUMALOG) 100 UNIT/ML injection Inject 1-8 Units into the skin See admin instructions. Per Sliding Scale at bedtime: 126-180 1 unit 181-200 2 units 201-250 4 units 251-300 6 units 301 or greater 8 units  . Insulin Pen Needle (NOVOFINE) 30G X 8 MM MISC Inject insulin into skin per units prescribed.  . Insulin Syringe-Needle U-100 (TRUEPLUS INSULIN SYRINGE) 31G X 5/16" 0.3 ML MISC Use as directed  . levETIRAcetam (KEPPRA) 500 MG tablet Take 1 tablet (500 mg total) by mouth 2 (two) times daily.  Marland Kitchen lisinopril (PRINIVIL,ZESTRIL) 10 MG tablet Take 1 tablet (10 mg total) by mouth daily. (Patient not taking: Reported on 12/18/2018)  . metoprolol tartrate (LOPRESSOR) 50 MG tablet Take 1 tablet (50 mg total) by mouth 2 (two) times daily.  . Multiple Vitamins-Minerals (CERTAGEN PO) Take 1 tablet by mouth daily.  . promethazine (PHENERGAN) 25 MG tablet Take 1 tablet (25 mg total) by mouth every 6 (six) hours as  needed for nausea or vomiting.  . sevelamer carbonate (RENVELA) 800 MG tablet Take 800 mg by mouth 3 (three) times daily with meals.  . torsemide (DEMADEX) 100 MG tablet Take 100 mg by mouth daily.  . traMADol (ULTRAM) 50 MG tablet Take 1 tablet (50 mg total) by mouth every 6 (six) hours as needed.  . ziprasidone (GEODON) 40 MG capsule Take 40 mg by mouth 2 (two) times daily with a meal.   Facility-Administered Encounter Medications as of 03/24/2020  Medication  . injection device for insulin    PHYSICAL EXAM/ROS:  General: NAD Cardiovascular: regular rate and rhythm Pulmonary: clear ant fields; no adventitious sounds auscultated Abdomen: soft, nontender, + bowel sounds GU: no suprapubic  tenderness Extremities: no edema, no joint deformities Skin: no rashes to exposed skin Neurological: Weakness but otherwise nonfocal  Teodoro Spray, NP

## 2020-04-12 IMAGING — CR DG CHEST 2V
2 series · 2 of 2 positions shown · non-contrast
Comparison: 08/14/2018

CLINICAL DATA: Chest pain

EXAM:
CHEST - 2 VIEW

[w chest lat]
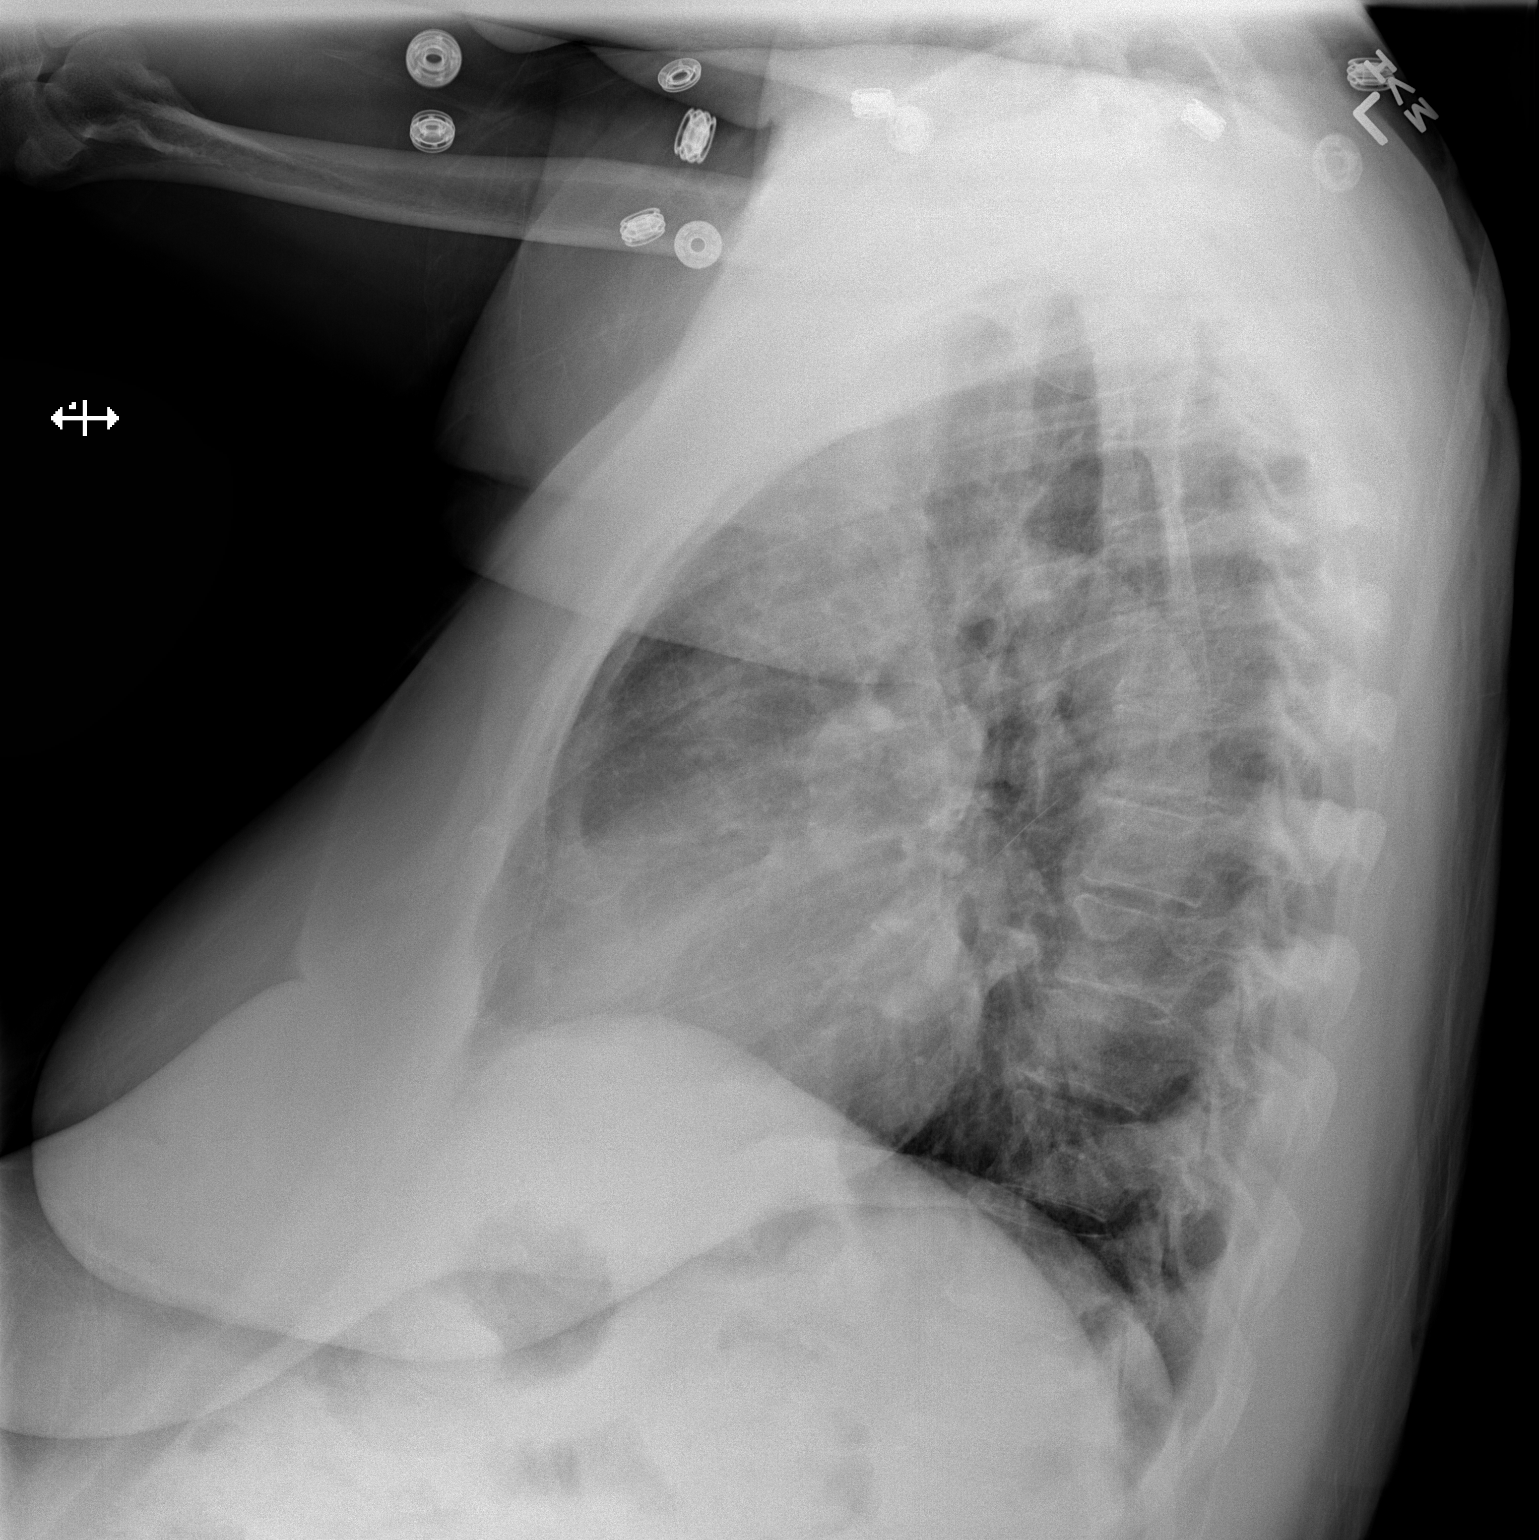

[x chest ap]
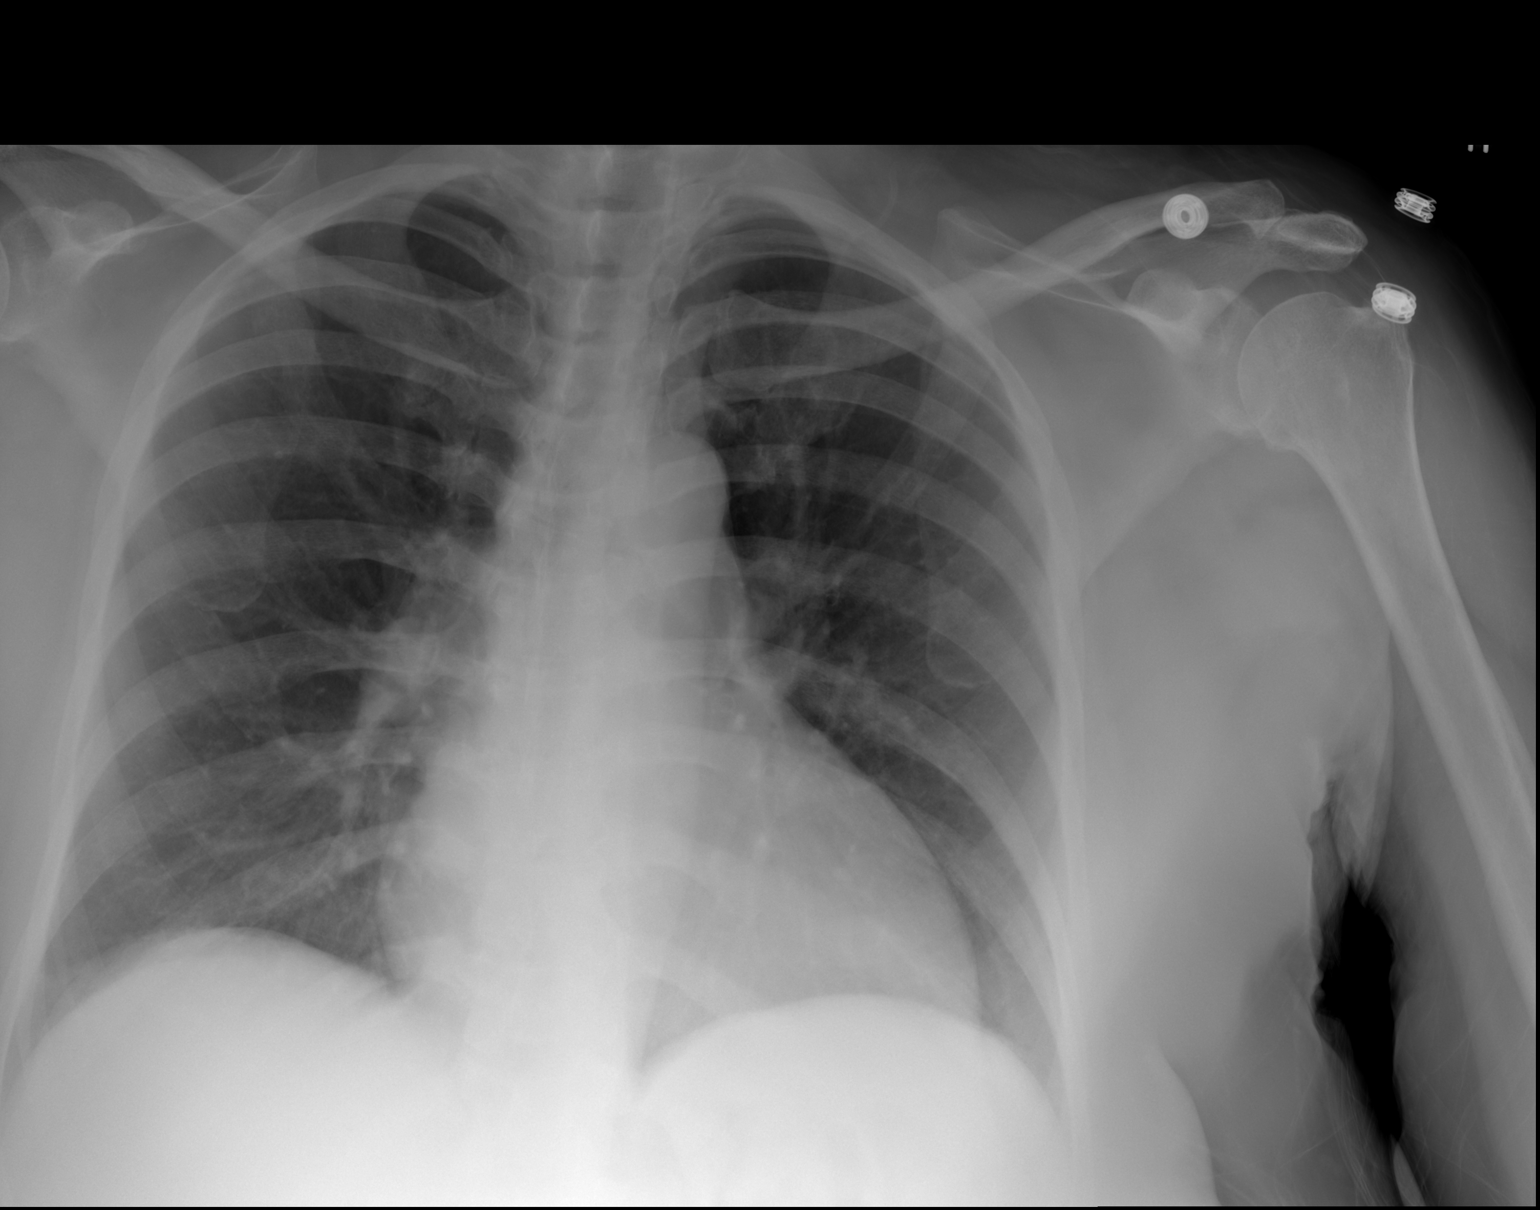

[2 of 2 positions shown; findings below may reference images not displayed]

FINDINGS: The heart size and mediastinal contours are within normal limits.
Both lungs are clear. The visualized skeletal structures are
unremarkable.
IMPRESSION: No active cardiopulmonary disease.

## 2020-05-16 ENCOUNTER — Encounter (HOSPITAL_COMMUNITY): Payer: Self-pay | Admitting: Emergency Medicine

## 2020-05-16 ENCOUNTER — Other Ambulatory Visit: Payer: Self-pay

## 2020-05-16 ENCOUNTER — Emergency Department (HOSPITAL_COMMUNITY): Payer: Medicare Other

## 2020-05-16 ENCOUNTER — Emergency Department (HOSPITAL_COMMUNITY)
Admission: EM | Admit: 2020-05-16 | Discharge: 2020-05-17 | Disposition: A | Discharge status: Home / Self Care | Payer: Medicare Other | Attending: Emergency Medicine | Admitting: Emergency Medicine

## 2020-05-16 DIAGNOSIS — I120 Hypertensive chronic kidney disease with stage 5 chronic kidney disease or end stage renal disease: Secondary | ICD-10-CM | POA: Insufficient documentation

## 2020-05-16 DIAGNOSIS — K5901 Slow transit constipation: Secondary | ICD-10-CM | POA: Diagnosis not present

## 2020-05-16 DIAGNOSIS — F17200 Nicotine dependence, unspecified, uncomplicated: Secondary | ICD-10-CM | POA: Diagnosis not present

## 2020-05-16 DIAGNOSIS — Z91040 Latex allergy status: Secondary | ICD-10-CM | POA: Diagnosis not present

## 2020-05-16 DIAGNOSIS — R1033 Periumbilical pain: Secondary | ICD-10-CM | POA: Diagnosis present

## 2020-05-16 DIAGNOSIS — Z880 Allergy status to penicillin: Secondary | ICD-10-CM | POA: Diagnosis not present

## 2020-05-16 DIAGNOSIS — E119 Type 2 diabetes mellitus without complications: Secondary | ICD-10-CM | POA: Diagnosis not present

## 2020-05-16 DIAGNOSIS — N185 Chronic kidney disease, stage 5: Secondary | ICD-10-CM | POA: Diagnosis not present

## 2020-05-16 DIAGNOSIS — Z79899 Other long term (current) drug therapy: Secondary | ICD-10-CM | POA: Diagnosis not present

## 2020-05-16 DIAGNOSIS — Z794 Long term (current) use of insulin: Secondary | ICD-10-CM | POA: Insufficient documentation

## 2020-05-16 LAB — COMPREHENSIVE METABOLIC PANEL
ALT: 16 U/L (ref 0–44)
AST: 18 U/L (ref 15–41)
Albumin: 3.5 g/dL (ref 3.5–5.0)
Alkaline Phosphatase: 105 U/L (ref 38–126)
Anion gap: 13 (ref 5–15)
BUN: 19 mg/dL (ref 6–20)
CO2: 31 mmol/L (ref 22–32)
Calcium: 8.6 mg/dL — ABNORMAL LOW (ref 8.9–10.3)
Chloride: 92 mmol/L — ABNORMAL LOW (ref 98–111)
Creatinine, Ser: 3.78 mg/dL — ABNORMAL HIGH (ref 0.44–1.00)
GFR calc Af Amer: 14 mL/min — ABNORMAL LOW (ref >60)
GFR calc non Af Amer: 12 mL/min — ABNORMAL LOW (ref >60)
Glucose, Bld: 166 mg/dL — ABNORMAL HIGH (ref 70–99)
Potassium: 3.7 mmol/L (ref 3.5–5.1)
Sodium: 136 mmol/L (ref 135–145)
Total Bilirubin: 0.7 mg/dL (ref 0.3–1.2)
Total Protein: 7.5 g/dL (ref 6.5–8.1)

## 2020-05-16 LAB — CBC
HCT: 38 % (ref 36.0–46.0)
Hemoglobin: 11.7 g/dL — ABNORMAL LOW (ref 12.0–15.0)
MCH: 27.2 pg (ref 26.0–34.0)
MCHC: 30.8 g/dL (ref 30.0–36.0)
MCV: 88.4 fL (ref 80.0–100.0)
Platelets: 217 10*3/uL (ref 150–400)
RBC: 4.3 MIL/uL (ref 3.87–5.11)
RDW: 17 % — ABNORMAL HIGH (ref 11.5–15.5)
WBC: 8.4 10*3/uL (ref 4.0–10.5)
nRBC: 0 % (ref 0.0–0.2)

## 2020-05-16 MED ORDER — POLYETHYLENE GLYCOL 3350 17 G PO PACK: 17.0000 g | PACK | Freq: Every day | ORAL | 0 refills | Status: DC

## 2020-05-16 NOTE — ED Provider Notes (Signed)
[place] EMERGENCY DEPARTMENT Provider Note   CSN: [id number] Arrival date & time: 05/16/20  1544     History Chief Complaint  Patient presents with  . Abdominal Pain    Amber Lara is a 59 y.o. female with a past medical history of ESRD on dialysis Tu, Th, Sat, without any missed sessions, prior stroke, diabetes, anemia, hypertension presenting to the ED with a chief complaint of abdominal pain.  About 24 hours ago started developing central abdominal pain.  States that she was laying down outside when the symptoms began.  Denies any nausea, vomiting, diarrhea, chest pain, shortness of breath or sick contacts with similar symptoms.  No suspicious food intake.  She denies any prior abdominal surgeries.  She still makes urine, denies any urinary symptoms.  HPI     Past Medical History:  Diagnosis Date  . Arthritis   . Chronic anemia   . CKD (chronic kidney disease), stage V ([place])   . Diabetes mellitus ([place])   . History of stroke   . Hypertension   . Obesity   . Psychosis ([place])    a. prior adm in [place] ([place]) in 2015 where pt was admitted with hallucinations, seeing men in black outfits, family members on a non-existent camera, hearing voices and seeing animals - UDS positive for methamphetamines at that time and patient stated she believed someone had put this in her body.  . Seizures [facility])     Patient Active Problem List   Diagnosis Date Noted  . History of stroke   . Pressure sore on heel, left, unstageable ([place]) 03/04/2020  . Pressure sore on heel, right, unstageable ([place]) 03/04/2020  . Acute renal failure (ARF) ([place]) 10/09/2018  . Acute deep vein thrombosis (DVT) of femoral vein of right lower extremity ([place]) 10/05/2018  . ESRD (end stage renal disease) ([place])   . CKD (chronic kidney disease), stage V ([place]) 06/04/2018  . Acute on chronic renal failure ([place]) 08/03/2017  . Seizure ([place]) 08/03/2017  . Hyperkalemia 07/14/2017  .  Acute kidney injury superimposed on chronic kidney disease ([place]) 07/14/2017  . Type 2 diabetes mellitus with hyperglycemia ([place]) 06/23/2017  . Normochromic normocytic anemia 06/23/2017  . Essential hypertension 06/23/2017    Past Surgical History:  Procedure Laterality Date  . ABDOMINAL HYSTERECTOMY    . AV FISTULA PLACEMENT Right 06/07/2018   Procedure: CREATION OF RIGHT BRACHIOCEPHALIC ARTERIOVENOUS FISTULA;  Surgeon: Serafina Mitchell, MD;  Location: [place];  Service: Vascular;  Laterality: Right;  . IR DIALY SHUNT INTRO [place] W/IMG RIGHT Right 10/09/2018  . IR US GUIDE VASC ACCESS RIGHT  10/09/2018     OB History   No obstetric history on file.     Family History  Problem Relation Age of Onset  . Diabetes Mellitus II Mother   . CAD Mother        First MI in her 31s, passed away before age 20  . Renal Disease Mother   . Diabetes Mellitus II Sister   . CAD Sister        Stents in her early 26s  . Renal Disease Sister   . Diabetes Mellitus II Brother     Social History   Tobacco Use  . Smoking status: Current Some Day Smoker  . Smokeless tobacco: Never Used  Vaping Use  . Vaping Use: Never used  Substance Use Topics  . Alcohol use: No  . Drug use: No    Home Medications  Prior to Admission medications   Medication Sig Start Date End Date Taking? Authorizing Provider  acetaminophen (TYLENOL) 325 MG tablet Take 2 tablets (650 mg total) by mouth every 6 (six) hours as needed for mild pain or moderate pain (or Fever >/= 101). 07/15/17   Hongalgi, Lenis Dickinson, MD  amLODipine (NORVASC) 10 MG tablet Take 1 tablet (10 mg total) by mouth daily. 07/23/18   Scot Jun, FNP  apixaban (ELIQUIS) 5 MG TABS tablet Take 2 tablets (10 mg total) by mouth 2 (two) times daily for 1 day. Patient not taking: Reported on 12/18/2018 10/16/18 10/17/18  Shelly Coss, MD  apixaban (ELIQUIS) 5 MG TABS tablet Take 1 tablet (5 mg total) by mouth 2 (two) times daily. Patient not  taking: Reported on 12/18/2018 10/18/18 12/11/18  Shelly Coss, MD  ARIPiprazole (ABILIFY) 5 MG tablet Take 5 mg by mouth daily.    [provider]  atorvastatin (LIPITOR) 40 MG tablet Take 1 tablet (40 mg total) by mouth daily at 6 PM. 07/19/18   Scot Jun, FNP  blood glucose meter kit and supplies KIT Dispense based on patient and insurance preference. Use up to four times daily as directed. (FOR ICD-9 250.00, 250.01). 07/23/18   Scot Jun, FNP  Ferrous Fumarate (HEMOCYTE - 106 MG FE) 324 (106 Fe) MG TABS tablet Take 1 tablet (106 mg of iron total) by mouth daily. 07/25/18   Scot Jun, FNP  gabapentin (NEURONTIN) 100 MG capsule Take 1 capsule (100 mg total) by mouth 2 (two) times daily. 07/19/18   Scot Jun, FNP  insulin aspart (NOVOLOG) 100 UNIT/ML injection Inject 3 times daily based on sliding scale: 125=0 units 126-180=1 unit, 181-200=2 units 201-250 administer= 4 units 251-300 administer =6 units 301 or more 8 units-call provider Patient not taking: Reported on 12/18/2018 07/23/18   Scot Jun, FNP  insulin glargine (LANTUS) 100 UNIT/ML injection Inject 0.3 mLs (30 Units total) into the skin at bedtime. 10/16/18 02/29/20  Shelly Coss, MD  insulin lispro (HUMALOG) 100 UNIT/ML injection Inject 1-8 Units into the skin See admin instructions. Per Sliding Scale at bedtime: 126-180 1 unit 181-200 2 units 201-250 4 units 251-300 6 units 301 or greater 8 units    [provider]  Insulin Pen Needle (NOVOFINE) 30G X 8 MM MISC Inject insulin into skin per units prescribed. 07/19/17   Scot Jun, FNP  Insulin Syringe-Needle U-100 (TRUEPLUS INSULIN SYRINGE) 31G X 5/16" 0.3 ML MISC Use as directed 07/19/18   Scot Jun, FNP  levETIRAcetam (KEPPRA) 500 MG tablet Take 1 tablet (500 mg total) by mouth 2 (two) times daily. 07/19/18 12/18/18  Scot Jun, FNP  lisinopril (PRINIVIL,ZESTRIL) 10 MG tablet Take 1 tablet (10 mg  total) by mouth daily. Patient not taking: Reported on 12/18/2018 10/16/18   Shelly Coss, MD  metoprolol tartrate (LOPRESSOR) 50 MG tablet Take 1 tablet (50 mg total) by mouth 2 (two) times daily. 07/19/18 12/18/18  Scot Jun, FNP  Multiple Vitamins-Minerals (CERTAGEN PO) Take 1 tablet by mouth daily.    [provider]  polyethylene glycol (MIRALAX) 17 g packet Take 17 g by mouth daily. 05/16/20   Kimble Hitchens, PA-C  promethazine (PHENERGAN) 25 MG tablet Take 1 tablet (25 mg total) by mouth every 6 (six) hours as needed for nausea or vomiting. 07/19/18   Scot Jun, FNP  sevelamer carbonate (RENVELA) 800 MG tablet Take 800 mg by mouth 3 (three) times daily with  meals.    [provider]  torsemide (DEMADEX) 100 MG tablet Take 100 mg by mouth daily.    [provider]  traMADol (ULTRAM) 50 MG tablet Take 1 tablet (50 mg total) by mouth every 6 (six) hours as needed. 03/17/20   Drenda Freeze, MD  ziprasidone (GEODON) 40 MG capsule Take 40 mg by mouth 2 (two) times daily with a meal.    [provider]    Allergies    Penicillins, Tetracyclines & related, Morphine and related, Amoxicillin, Latex, and Tape  Review of Systems   Review of Systems  Constitutional: Negative for appetite change, chills and fever.  HENT: Negative for ear pain, rhinorrhea, sneezing and sore throat.   Eyes: Negative for photophobia and visual disturbance.  Respiratory: Negative for cough, chest tightness, shortness of breath and wheezing.   Cardiovascular: Negative for chest pain and palpitations.  Gastrointestinal: Positive for abdominal pain. Negative for blood in stool, constipation, diarrhea, nausea and vomiting.  Genitourinary: Negative for dysuria, hematuria and urgency.  Musculoskeletal: Negative for myalgias.  Skin: Negative for rash.  Neurological: Negative for dizziness, weakness and light-headedness.    Physical Exam Updated Vital Signs BP 124/63  (BP Location: Right Arm)   Pulse 73   Temp 98.4 F (36.9 C) (Oral)   Resp 16   Ht '5\' 3"'$  (1.6 m)   Wt 94.3 kg   SpO2 100%   BMI 36.83 kg/m   Physical Exam Vitals and nursing note reviewed.  Constitutional:      General: She is not in acute distress.    Appearance: She is well-developed.  HENT:     Head: Normocephalic and atraumatic.     Nose: Nose normal.  Eyes:     General: No scleral icterus.       Left eye: No discharge.     Conjunctiva/sclera: Conjunctivae normal.  Cardiovascular:     Rate and Rhythm: Normal rate and regular rhythm.     Heart sounds: Normal heart sounds. No murmur heard.  No friction rub. No gallop.   Pulmonary:     Effort: Pulmonary effort is normal. No respiratory distress.     Breath sounds: Normal breath sounds.  Abdominal:     General: Bowel sounds are normal. There is no distension.     Palpations: Abdomen is soft.     Tenderness: There is abdominal tenderness in the periumbilical area. There is no guarding.  Musculoskeletal:        General: Normal range of motion.     Cervical back: Normal range of motion and neck supple.  Skin:    General: Skin is warm and dry.     Findings: No rash.  Neurological:     Mental Status: She is alert.     Motor: No abnormal muscle tone.     Coordination: Coordination normal.     ED Results / Procedures / Treatments   Labs (all labs ordered are listed, but only abnormal results are displayed) Labs Reviewed  COMPREHENSIVE METABOLIC PANEL - Abnormal; Notable for the following components:      Result Value   Chloride 92 (*)    Glucose, Bld 166 (*)    Creatinine, Ser 3.78 (*)    Calcium 8.6 (*)    GFR calc non Af Amer 12 (*)    GFR calc Af Amer 14 (*)    All other components within normal limits  CBC - Abnormal; Notable for the following components:   Hemoglobin 11.7 (*)  RDW 17.0 (*)    All other components within normal limits    EKG None  Radiology CT ABDOMEN PELVIS WO CONTRAST  Result  Date: 05/16/2020 CLINICAL DATA:  Epigastric pain EXAM: CT ABDOMEN AND PELVIS WITHOUT CONTRAST TECHNIQUE: Multidetector CT imaging of the abdomen and pelvis was performed following the standard protocol without IV contrast. COMPARISON:  10/06/2018 FINDINGS: Lower chest: Lung bases are clear. No effusions. Heart is normal size. Hepatobiliary: No focal hepatic abnormality. Gallbladder unremarkable. Pancreas: No focal abnormality or ductal dilatation. Spleen: Punctate calcification within the spleen.  Normal size. Adrenals/Urinary Tract: No adrenal abnormality. No focal renal abnormality. No stones or hydronephrosis. Urinary bladder is unremarkable. Stomach/Bowel: Moderate stool throughout the colon. Normal appendix. Stomach, large and small bowel grossly unremarkable. Vascular/Lymphatic: No evidence of aneurysm or adenopathy. Reproductive: Prior hysterectomy.  No adnexal masses. Other: No free fluid or free air. Musculoskeletal: No acute bony abnormality. IMPRESSION: Moderate stool burden throughout the colon. Normal appendix. No renal or ureteral stones. No acute findings in the abdomen or pelvis. Electronically Signed   By: Rolm Baptise M.D.   On: 05/16/2020 18:20    Procedures Procedures (including critical care time)  Medications Ordered in ED Medications - No data to display  ED Course  I have reviewed the triage vital signs and the nursing notes.  Pertinent labs & imaging results that were available during my care of the patient were reviewed by me and considered in my medical decision making (see chart for details).    MDM Rules/Calculators/A&P                          59 year old female with a past medical history of ESRD on dialysis without any missed sessions, completed session today, diabetes, anemia, hypertension presenting to the ED with a chief complaint of abdominal pain.  Central abdominal pain around her umbilicus 24 hours ago.  He denies any bloody stools, nausea, vomiting, diarrhea,  chest pain, shortness of breath or suspicious food intake.  Denies any fevers or recent use of antipyretics.  On exam abdomen is tender around the umbilicus without rebound or guarding.  Denies any urinary symptoms as she does still make some urine.  Lab work including CBC, CMP unremarkable, consistent with her known ESRD.  CT of the abdomen pelvis shows moderate stool burden but I believe could be the cause of her symptoms today.  She did not have a bowel movement today but did have 1 yesterday.  We will give MiraLAX to help with constipation, advised increasing fluid intake and following up with PCP.  No evidence of obstruction at this time.  Patient is agreeable to the plan.  Strict return precautions given.  All imaging, if done today, including plain films, CT scans, and ultrasounds, independently reviewed by me, and interpretations confirmed via formal radiology reads.  Patient is hemodynamically stable, in NAD, and able to ambulate in the ED. Evaluation does not show pathology that would require ongoing emergent intervention or inpatient treatment. I explained the diagnosis to the patient. Pain has been managed and has no complaints prior to discharge. Patient is comfortable with above plan and is stable for discharge at this time. All questions were answered prior to disposition. Strict return precautions for returning to the ED were discussed. Encouraged follow up with PCP.   An After Visit Summary was printed and given to the patient.   Portions of this note were generated with Lobbyist. Dictation errors  may occur despite best attempts at proofreading.  Final Clinical Impression(s) / ED Diagnoses Final diagnoses:  Slow transit constipation    Rx / DC Orders ED Discharge Orders         Ordered    polyethylene glycol (MIRALAX) 17 g packet  Daily     Discontinue  Reprint     05/16/20 1831           Delia Heady, PA-C 05/16/20 1844    Wyvonnia Dusky,  MD 05/17/20 [phone]

## 2020-05-16 NOTE — ED Notes (Signed)
Secretary to call [facility] for transport 

## 2020-05-16 NOTE — Discharge Instructions (Signed)
Take MiraLAX as directed. Follow-up with your primary care provider. Continue your dialysis sessions as usual. Patient drinking plenty of fluids. Return to the ER for worsening abdominal pain, if you develop a fever, chest pain, shortness of breath.

## 2020-05-16 NOTE — ED Notes (Signed)
Pt discharged earlier this evening.  When pt got to lobby, she had someone call her a cab.  Cab just returned saying the place he took pt to refused to accept her and brought her back to ED.  Cab company wanted voucher.  Darrel, EMT got authorization for cab voucher.  This nurse talked with Trevor Mace who advised she did not know that pt was at hospital.  This nurse explained to daughter that pt was brought by EMS from dialysis center.

## 2020-05-16 NOTE — ED Triage Notes (Signed)
Pt BIB [facility] from [facility]. Per EMS pt received full dialysis treatment. Per kidney center pt complaining of chest pain but upon EMS arrival pt complaining of central abdominal pain. VSS. NAD.

## 2020-05-16 NOTE — ED Notes (Signed)
[place] and talked with Amber Lara, pt arrived to facility by cab, facility was unsure about accepting patient, in the meantime cab left and brought pt back to ED.  Advised Amber Lara that pt would be returning back to facility via [facility]

## 2020-06-03 ENCOUNTER — Ambulatory Visit (INDEPENDENT_AMBULATORY_CARE_PROVIDER_SITE_OTHER): Payer: Medicare Other | Admitting: Podiatry

## 2020-06-03 ENCOUNTER — Encounter: Payer: Self-pay | Admitting: Podiatry

## 2020-06-03 ENCOUNTER — Other Ambulatory Visit: Payer: Self-pay

## 2020-06-03 DIAGNOSIS — Z794 Long term (current) use of insulin: Secondary | ICD-10-CM

## 2020-06-03 DIAGNOSIS — M79674 Pain in right toe(s): Secondary | ICD-10-CM

## 2020-06-03 DIAGNOSIS — M79675 Pain in left toe(s): Secondary | ICD-10-CM

## 2020-06-03 DIAGNOSIS — I82411 Acute embolism and thrombosis of right femoral vein: Secondary | ICD-10-CM | POA: Diagnosis not present

## 2020-06-03 DIAGNOSIS — E1165 Type 2 diabetes mellitus with hyperglycemia: Secondary | ICD-10-CM

## 2020-06-03 DIAGNOSIS — N186 End stage renal disease: Secondary | ICD-10-CM | POA: Diagnosis not present

## 2020-06-03 DIAGNOSIS — N189 Chronic kidney disease, unspecified: Secondary | ICD-10-CM

## 2020-06-03 DIAGNOSIS — N185 Chronic kidney disease, stage 5: Secondary | ICD-10-CM

## 2020-06-03 DIAGNOSIS — B351 Tinea unguium: Secondary | ICD-10-CM | POA: Diagnosis not present

## 2020-06-03 DIAGNOSIS — L89610 Pressure ulcer of right heel, unstageable: Secondary | ICD-10-CM

## 2020-06-03 DIAGNOSIS — N179 Acute kidney failure, unspecified: Secondary | ICD-10-CM

## 2020-06-03 NOTE — Progress Notes (Signed)
This patient returns to my office for at risk foot care.  This patient requires this care by a professional since this patient will be at risk due to having CVA, coagulation defect,  type 2 diabetes and chronic kidney disease.  Patient is taking eliquis.  This patient is unable to cut nails herself since the patient cannot reach her nails.These nails are painful walking and wearing shoes. She also has callus behind her heel bone left foot.  She was dispensed heel cushions for both feet but says she has lost the heel cushions.  She is accompanied by caregiver and patient is treated in her wheelchair. This patient presents for at risk foot care today.  General Appearance  Alert, conversant and in no acute stress.  Vascular  Dorsalis pedis and posterior tibial  pulses are weakly  palpable  bilaterally.  Capillary return is within normal limits  bilaterally. Temperature is within normal limits  bilaterally.  Neurologic  Deferred   Nails Thick disfigured discolored nails with subungual debris  from hallux to fifth toes bilaterally. No evidence of bacterial infection or drainage bilaterally.  Orthopedic  No limitations of motion  feet .  No crepitus or effusions noted.  No bony pathology or digital deformities noted.  Skin  normotropic skin with no porokeratosis noted bilaterally.  No signs of infections or ulcers noted.   Retrocalcaneal callus posterior aspect left hallux.  Onychomycosis  Pain in right toes  Pain in left toes  Callus left heel.  Consent was obtained for treatment procedures.   Mechanical debridement of nails 1-5  bilaterally performed with a nail nipper.  Filed with dremel without incident. Callus left heel filed with dremel tool.  Told her to use pillows on her left heel.   Return office visit    3 months                 Told patient to return for periodic foot care and evaluation due to potential at risk complications.   Gardiner Barefoot DPM

## 2020-06-27 ENCOUNTER — Emergency Department (HOSPITAL_COMMUNITY)
Admission: EM | Admit: 2020-06-27 | Discharge: 2020-06-27 | Disposition: A | Discharge status: Home / Self Care | Payer: Medicare Other | Attending: Emergency Medicine | Admitting: Emergency Medicine

## 2020-06-27 ENCOUNTER — Encounter (HOSPITAL_COMMUNITY): Payer: Self-pay | Admitting: *Deleted

## 2020-06-27 ENCOUNTER — Emergency Department (HOSPITAL_COMMUNITY): Payer: Medicare Other

## 2020-06-27 ENCOUNTER — Other Ambulatory Visit: Payer: Self-pay

## 2020-06-27 DIAGNOSIS — N186 End stage renal disease: Secondary | ICD-10-CM | POA: Insufficient documentation

## 2020-06-27 DIAGNOSIS — Z7901 Long term (current) use of anticoagulants: Secondary | ICD-10-CM | POA: Insufficient documentation

## 2020-06-27 DIAGNOSIS — F17200 Nicotine dependence, unspecified, uncomplicated: Secondary | ICD-10-CM | POA: Insufficient documentation

## 2020-06-27 DIAGNOSIS — Z79899 Other long term (current) drug therapy: Secondary | ICD-10-CM | POA: Insufficient documentation

## 2020-06-27 DIAGNOSIS — W07XXXA Fall from chair, initial encounter: Secondary | ICD-10-CM | POA: Diagnosis not present

## 2020-06-27 DIAGNOSIS — Z91040 Latex allergy status: Secondary | ICD-10-CM | POA: Insufficient documentation

## 2020-06-27 DIAGNOSIS — Z992 Dependence on renal dialysis: Secondary | ICD-10-CM | POA: Diagnosis not present

## 2020-06-27 DIAGNOSIS — E119 Type 2 diabetes mellitus without complications: Secondary | ICD-10-CM | POA: Insufficient documentation

## 2020-06-27 DIAGNOSIS — Z794 Long term (current) use of insulin: Secondary | ICD-10-CM | POA: Insufficient documentation

## 2020-06-27 DIAGNOSIS — S0990XA Unspecified injury of head, initial encounter: Secondary | ICD-10-CM | POA: Diagnosis present

## 2020-06-27 DIAGNOSIS — I120 Hypertensive chronic kidney disease with stage 5 chronic kidney disease or end stage renal disease: Secondary | ICD-10-CM | POA: Diagnosis not present

## 2020-06-27 MED ORDER — ACETAMINOPHEN 500 MG PO TABS
1000.0000 mg | ORAL_TABLET | Freq: Once | ORAL | Status: AC
  Administered 2020-06-27: 1000 mg via ORAL
  Filled 2020-06-27: qty 2
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
## 2020-06-27 NOTE — Discharge Instructions (Signed)
If you develop continued, recurrent, or worsening headache, fever, neck stiffness, vomiting, blurry or double vision, weakness or numbness in your arms or legs, trouble speaking, or any other new/concerning symptoms then return to the ER for evaluation.  

## 2020-06-27 NOTE — ED Notes (Signed)
ED RN Luellen Pucker attempted to call pt family member listed.

## 2020-06-27 NOTE — ED Notes (Signed)
Attempted to call Daughter listed in Pt chart at [phone]. TC place no answer at this number
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
## 2020-06-27 NOTE — ED Notes (Signed)
Second telephone to Daughter at number listed in the Pt's contact list.  Daughter answered telephone . Pt is now talking to daughter on phone.Daughter reports she is Medical POA.  Pt is not to make any decisions .

## 2020-07-03 ENCOUNTER — Non-Acute Institutional Stay: Payer: Medicare Other | Admitting: Hospice

## 2020-07-03 ENCOUNTER — Other Ambulatory Visit: Payer: Self-pay

## 2020-07-03 DIAGNOSIS — N186 End stage renal disease: Secondary | ICD-10-CM

## 2020-07-03 DIAGNOSIS — Z515 Encounter for palliative care: Secondary | ICD-10-CM
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
## 2020-08-19 ENCOUNTER — Other Ambulatory Visit: Payer: Self-pay

## 2020-08-19 ENCOUNTER — Emergency Department (HOSPITAL_COMMUNITY): Payer: Medicare Other

## 2020-08-19 ENCOUNTER — Emergency Department (HOSPITAL_COMMUNITY)
Admission: EM | Admit: 2020-08-19 | Discharge: 2020-08-20 | Disposition: A | Discharge status: Home / Self Care | Payer: Medicare Other | Attending: Emergency Medicine | Admitting: Emergency Medicine

## 2020-08-19 DIAGNOSIS — R072 Precordial pain: Secondary | ICD-10-CM | POA: Diagnosis not present

## 2020-08-19 DIAGNOSIS — Z79899 Other long term (current) drug therapy: Secondary | ICD-10-CM | POA: Diagnosis not present

## 2020-08-19 DIAGNOSIS — Z794 Long term (current) use of insulin: Secondary | ICD-10-CM | POA: Diagnosis not present

## 2020-08-19 DIAGNOSIS — Z91040 Latex allergy status: Secondary | ICD-10-CM | POA: Insufficient documentation

## 2020-08-19 DIAGNOSIS — Z7901 Long term (current) use of anticoagulants: Secondary | ICD-10-CM | POA: Insufficient documentation

## 2020-08-19 DIAGNOSIS — F29 Unspecified psychosis not due to a substance or known physiological condition: Secondary | ICD-10-CM

## 2020-08-19 DIAGNOSIS — E1165 Type 2 diabetes mellitus with hyperglycemia: Secondary | ICD-10-CM | POA: Insufficient documentation

## 2020-08-19 DIAGNOSIS — N186 End stage renal disease: Secondary | ICD-10-CM | POA: Insufficient documentation

## 2020-08-19 DIAGNOSIS — I120 Hypertensive chronic kidney disease with stage 5 chronic kidney disease or end stage renal disease: Secondary | ICD-10-CM | POA: Diagnosis not present

## 2020-08-19 DIAGNOSIS — D649 Anemia, unspecified: Secondary | ICD-10-CM | POA: Diagnosis not present

## 2020-08-19 DIAGNOSIS — R0789 Other chest pain: Secondary | ICD-10-CM

## 2020-08-19 DIAGNOSIS — F17200 Nicotine dependence, unspecified, uncomplicated: Secondary | ICD-10-CM | POA: Diagnosis not present

## 2020-08-19 LAB — BASIC METABOLIC PANEL
Anion gap: 17 — ABNORMAL HIGH (ref 5–15)
BUN: 24 mg/dL — ABNORMAL HIGH (ref 6–20)
CO2: 27 mmol/L (ref 22–32)
Calcium: 8.9 mg/dL (ref 8.9–10.3)
Chloride: 94 mmol/L — ABNORMAL LOW (ref 98–111)
Creatinine, Ser: 4.65 mg/dL — ABNORMAL HIGH (ref 0.44–1.00)
GFR, Estimated: 10 mL/min — ABNORMAL LOW (ref >60)
Glucose, Bld: 146 mg/dL — ABNORMAL HIGH (ref 70–99)
Potassium: 3.7 mmol/L (ref 3.5–5.1)
Sodium: 138 mmol/L (ref 135–145)

## 2020-08-19 LAB — I-STAT BETA HCG BLOOD, ED (MC, WL, AP ONLY): I-stat hCG, quantitative: <5 m[IU]/mL (ref <5)

## 2020-08-19 LAB — CBC
HCT: 34.3 % — ABNORMAL LOW (ref 36.0–46.0)
Hemoglobin: 10.6 g/dL — ABNORMAL LOW (ref 12.0–15.0)
MCH: 28.9 pg (ref 26.0–34.0)
MCHC: 30.9 g/dL (ref 30.0–36.0)
MCV: 93.5 fL (ref 80.0–100.0)
Platelets: 265 10*3/uL (ref 150–400)
RBC: 3.67 MIL/uL — ABNORMAL LOW (ref 3.87–5.11)
RDW: 16.9 % — ABNORMAL HIGH (ref 11.5–15.5)
WBC: 8.1 10*3/uL (ref 4.0–10.5)
nRBC: 0.5 % — ABNORMAL HIGH (ref 0.0–0.2)

## 2020-08-19 LAB — TROPONIN I (HIGH SENSITIVITY)
Troponin I (High Sensitivity): 4 ng/L (ref <18)
Troponin I (High Sensitivity): 4 ng/L (ref <18)

## 2020-08-19 MED ORDER — LORAZEPAM 2 MG/ML IJ SOLN
1.0000 mg | Freq: Once | INTRAMUSCULAR | Status: AC
  Administered 2020-08-19: 1 mg via INTRAVENOUS
  Filled 2020-08-19: qty 1

## 2020-08-19 MED ORDER — TRAMADOL HCL 50 MG PO TABS
50.0000 mg | ORAL_TABLET | Freq: Four times a day (QID) | ORAL | 0 refills | Status: DC | PRN
Start: 2020-08-19 — End: 2020-09-27

## 2020-08-19 MED ORDER — TRAMADOL HCL 50 MG PO TABS
50.0000 mg | ORAL_TABLET | Freq: Once | ORAL | Status: AC
  Administered 2020-08-19: 50 mg via ORAL
  Filled 2020-08-19: qty 1

## 2020-08-19 NOTE — ED Provider Notes (Signed)
[facility] EMERGENCY DEPARTMENT Provider Note   CSN: [id number] Arrival date & time: 08/19/20  1633     History Chief Complaint  Patient presents with  . Chest Pain    Amber Lara is a 59 y.o. female.  HPI   Amber Lara is a 59 y.o. lady with PMHx DM, ESRD on HD, chronic anemia, DVT, CVA, seizures, and psychosis, presenting with substernal chest pain that started two hours into her hemodialysis session today. She describes the pain as a 10/10 sharp, stabbing pain that has remained constant since onset in the center of her chest. Her pain does not radiate, and is not associated with any other symptoms such as SOB, cough, lower extremity pain or swelling, abdominal pain, nausea, fevers, or chills. She states her pain is worse with movement and palpation and improves with deep breathing. She states she had an MI 5 years ago and feels her pain is similar to pain she experienced at that time, although there is no record of this in her chart. She was given ASA 332m by EMS en route and was unable to be still enough to receive NTG. She states she continues to take Eliquis for a history of DVT's.  Past Medical History:  Diagnosis Date  . Arthritis   . Chronic anemia   . CKD (chronic kidney disease), stage V ([place]   . Diabetes mellitus ([place]   . History of stroke   . Hypertension   . Obesity   . Psychosis ([place]    [place] was admitted with hallucinations, seeing men in black outfits, family members on a non-existent camera, hearing voices and seeing animals - UDS positive for methamphetamines at that time and patient stated she believed someone had put this in her body.  . Seizures ([facility]     Patient Active Problem List   Diagnosis Date Noted  . Pain due to onychomycosis of toenails of both feet 06/03/2020  . History of stroke   . Pressure sore on heel, left, unstageable ([place] 03/04/2020  . Pressure sore on  heel, right, unstageable (HE[place] 03/04/2020  . Acute renal failure (ARF) (HR[place] 10/09/2018  . Acute deep vein thrombosis (DVT) of femoral vein of right lower extremity (HEpping 10/05/2018  . ESRD (end stage renal disease) (HA[place]   . CKD (chronic kidney disease), stage V ([place] 06/04/2018  . Acute on chronic renal failure (H[place] 08/03/2017  . Seizure (HW[place] 08/03/2017  . Hyperkalemia 07/14/2017  . Acute kidney injury superimposed on chronic kidney disease (HCraigs[place] 07/14/2017  . Type 2 diabetes mellitus with hyperglycemia ([place] 06/23/2017  . Normochromic normocytic anemia 06/23/2017  . Essential hypertension 06/23/2017    Past Surgical History:  Procedure Laterality Date  . ABDOMINAL HYSTERECTOMY    . AV FISTULA PLACEMENT Right 06/07/2018   Procedure: CREATION OF RIGHT BRACHIOCEPHALIC ARTERIOVENOUS FISTULA;  Surgeon: BSerafina Mitchell MD;  [facility]  Service: Vascular;  Laterality: Right;  . IR DIALY [place]O [place] RIGHT Right 10/09/2018  . IR [place] ACCESS RIGHT  10/09/2018     OB History   No obstetric history on file.     Family History  Problem Relation Age of Onset  . Diabetes Mellitus II Mother   . CAD Mother        First MI in her 438s passed away before age 58 . Renal Disease Mother   . Diabetes Mellitus II Sister   .  CAD Sister        Stents in her early 1s  . Renal Disease Sister   . Diabetes Mellitus II Brother     Social History   Tobacco Use  . Smoking status: Current Some Day Smoker  . Smokeless tobacco: Never Used  Vaping Use  . Vaping Use: Never used  Substance Use Topics  . Alcohol use: No  . Drug use: No    Home Medications Prior to Admission medications   Medication Sig Start Date End Date Taking? Authorizing Provider  acetaminophen (TYLENOL) 325 MG tablet Take 2 tablets (650 mg total) by mouth every 6 (six) hours as needed for mild pain or moderate pain (or Fever >/= 101). 07/15/17   Hongalgi, Lenis Dickinson, MD    amLODipine (NORVASC) 10 MG tablet Take 1 tablet (10 mg total) by mouth daily. 07/23/18   Scot Jun, FNP  apixaban (ELIQUIS) 5 MG TABS tablet Take 2 tablets (10 mg total) by mouth 2 (two) times daily for 1 day. Patient not taking: Reported on 12/18/2018 10/16/18 10/17/18  Shelly Coss, MD  apixaban (ELIQUIS) 5 MG TABS tablet Take 1 tablet (5 mg total) by mouth 2 (two) times daily. Patient not taking: Reported on 12/18/2018 10/18/18 12/11/18  Shelly Coss, MD  ARIPiprazole (ABILIFY) 5 MG tablet Take 5 mg by mouth daily.    [provider]  atorvastatin (LIPITOR) 40 MG tablet Take 1 tablet (40 mg total) by mouth daily at 6 PM. 07/19/18   Scot Jun, FNP  blood glucose meter kit and supplies KIT Dispense based on patient and insurance preference. Use up to four times daily as directed. (FOR ICD-9 250.00, 250.01). 07/23/18   Scot Jun, FNP  Ferrous Fumarate (HEMOCYTE - 106 MG FE) 324 (106 Fe) MG TABS tablet Take 1 tablet (106 mg of iron total) by mouth daily. 07/25/18   Scot Jun, FNP  gabapentin (NEURONTIN) 100 MG capsule Take 1 capsule (100 mg total) by mouth 2 (two) times daily. 07/19/18   Scot Jun, FNP  insulin aspart (NOVOLOG) 100 UNIT/ML injection Inject 3 times daily based on sliding scale: 125=0 units 126-180=1 unit, 181-200=2 units 201-250 administer= 4 units 251-300 administer =6 units 301 or more 8 units-call provider Patient not taking: Reported on 12/18/2018 07/23/18   Scot Jun, FNP  insulin glargine (LANTUS) 100 UNIT/ML injection Inject 0.3 mLs (30 Units total) into the skin at bedtime. 10/16/18 02/29/20  Shelly Coss, MD  insulin lispro (HUMALOG) 100 UNIT/ML injection Inject 1-8 Units into the skin See admin instructions. Per Sliding Scale at bedtime: 126-180 1 unit 181-200 2 units 201-250 4 units 251-300 6 units 301 or greater 8 units    [provider]  Insulin Pen Needle (NOVOFINE) 30G X 8 MM MISC Inject insulin  into skin per units prescribed. 07/19/17   Scot Jun, FNP  Insulin Syringe-Needle U-100 (TRUEPLUS INSULIN SYRINGE) 31G X 5/16" 0.3 ML MISC Use as directed 07/19/18   Scot Jun, FNP  levETIRAcetam (KEPPRA) 500 MG tablet Take 1 tablet (500 mg total) by mouth 2 (two) times daily. 07/19/18 12/18/18  Scot Jun, FNP  lisinopril (PRINIVIL,ZESTRIL) 10 MG tablet Take 1 tablet (10 mg total) by mouth daily. Patient not taking: Reported on 12/18/2018 10/16/18   Shelly Coss, MD  metoprolol tartrate (LOPRESSOR) 50 MG tablet Take 1 tablet (50 mg total) by mouth 2 (two) times daily. 07/19/18 12/18/18  Scot Jun, FNP  Multiple Vitamins-Minerals (CERTAGEN PO)  Take 1 tablet by mouth daily.    [provider]  polyethylene glycol (MIRALAX) 17 g packet Take 17 g by mouth daily. 05/16/20   Khatri, Hina, PA-C  promethazine (PHENERGAN) 25 MG tablet Take 1 tablet (25 mg total) by mouth every 6 (six) hours as needed for nausea or vomiting. 07/19/18   Scot Jun, FNP  sevelamer carbonate (RENVELA) 800 MG tablet Take 800 mg by mouth 3 (three) times daily with meals.    [provider]  torsemide (DEMADEX) 100 MG tablet Take 100 mg by mouth daily.    [provider]  traMADol (ULTRAM) 50 MG tablet Take 1 tablet (50 mg total) by mouth every 6 (six) hours as needed for up to 5 doses for severe pain (or agitation). 08/19/20   Jeralyn Bennett, MD  ziprasidone (GEODON) 40 MG capsule Take 40 mg by mouth 2 (two) times daily with a meal.    [provider]    Allergies    Penicillins, Tetracyclines & related, Morphine and related, Amoxicillin, Latex, and Tape  Review of Systems   Review of Systems: 10 point review of systems otherwise negative except as noted above in HPI.   Physical Exam Updated Vital Signs BP 138/84   Pulse (!) 54   Temp 98.8 F (37.1 C)   Resp 18   SpO2 (!) 77%   Physical Exam  General: Patient appears chronically ill but  otherwise well. No acute distress. Eyes: Sclera non-icteric. No conjunctival injection. There is bilateral left-beating nystagmus and patient endorses history of blindness. HENT: Neck is supple. No nasal discharge. Respiratory: Lungs are CTA, anteriorly bilaterally. No increased work of breathing or tachypnea.  Cardiovascular: Regular rate and rhythm. No murmurs, rubs, or gallops. There is trace bilateral lower extremity pitting edema.  Neurological: Patient is alert and oriented to self and date and situation but not city. She has bilateral left beating nystagmus and decreased visual acuity with inability to track EOM. She is unable to feel light touch in the V1-V3 distributions of CN V on the right and has a very mild right lower facial droop. CN II-XII are otherwise intact.  Musculoskeletal: There is moderate tenderness to palpation of the anterior chest wall over the costochondral junctions on the left. She has 5/5 strength in all four extremities, although strength is slightly diminished on the right compared to the left. Slightly diminished muscle bulk with increased tone. Abdominal: Soft and non-tender to palpation. Bowel sounds intact. No rebound or guarding. Skin: No lesions. No rashes.  Psych: Normal affect. Normal tone of voice.   ED Results / Procedures / Treatments   Labs (all labs ordered are listed, but only abnormal results are displayed) Labs Reviewed  BASIC METABOLIC PANEL - Abnormal; Notable for the following components:      Result Value   Chloride 94 (*)    Glucose, Bld 146 (*)    BUN 24 (*)    Creatinine, Ser 4.65 (*)    GFR, Estimated 10 (*)    Anion gap 17 (*)    All other components within normal limits  CBC - Abnormal; Notable for the following components:   RBC 3.67 (*)    Hemoglobin 10.6 (*)    HCT 34.3 (*)    RDW 16.9 (*)    nRBC 0.5 (*)    All other components within normal limits  I-STAT BETA HCG BLOOD, ED ([facility], [facility], [facility] ONLY)  TROPONIN I (HIGH SENSITIVITY)   TROPONIN I (HIGH SENSITIVITY)  EKG EKG Interpretation  Date/Time:  Wednesday August 19 2020 16:35:37 EST Ventricular Rate:  77 PR Interval:  230 QRS Duration: 92 QT Interval:  392 QTC Calculation: 443 R Axis:   48 Text Interpretation: Sinus rhythm with 1st degree A-V block Cannot rule out Anterior infarct , age undetermined Abnormal ECG No significant change since last tracing Confirmed by Wandra Arthurs [phone]) on 08/19/2020 6:38:48 PM   Radiology DG Chest 2 View  Result Date: 08/19/2020 CLINICAL DATA:  Chest pain. EXAM: CHEST - 2 VIEW COMPARISON:  March 17, 2020. FINDINGS: Similar mildly enlarged cardiac silhouette. Pulmonary vascular congestion. No consolidation. No visible pleural effusions or pneumothorax. The visualized skeletal structures are unremarkable. IMPRESSION: 1. No acute cardiopulmonary disease. 2. Similar mild cardiomegaly and pulmonary vascular congestion without overt pulmonary edema. Electronically Signed   By: Margaretha Sheffield MD   On: 08/19/2020 17:38   CT Head Wo Contrast  Result Date: 08/19/2020 CLINICAL DATA:  Mental status change EXAM: CT HEAD WITHOUT CONTRAST TECHNIQUE: Contiguous axial images were obtained from the base of the skull through the vertex without intravenous contrast. COMPARISON:  June 27, 2020 FINDINGS: Brain: No evidence of acute territorial infarction, hemorrhage, hydrocephalus,extra-axial collection or mass lesion/mass effect. There is dilatation the ventricles and sulci consistent with age-related atrophy. Low-attenuation changes in the deep white matter consistent with small vessel ischemia. Again noted are areas of encephalomalacia within the left cerebral hemisphere. Vascular: No hyperdense vessel or unexpected calcification. Skull: The skull is intact. Cortical thinning with overlying surgical clips seen along the left temporal skull. No fracture or focal lesion identified. Sinuses/Orbits: The visualized paranasal sinuses and mastoid  air cells are clear. The orbits and globes intact. Other: None IMPRESSION: No acute intracranial abnormality. Findings consistent with age related atrophy and chronic small vessel ischemia Areas of encephalomalacia involving the left cerebral hemisphere. Electronically Signed   By: Prudencio Pair M.D.   On: 08/19/2020 21:06    Procedures Procedures (including critical care time)  Medications Ordered in ED Medications  traMADol (ULTRAM) tablet 50 mg (50 mg Oral Given 08/19/20 1851)  LORazepam (ATIVAN) injection 1 mg (1 mg Intravenous Given 08/19/20 1931)    ED Course  I have reviewed the triage vital signs and the nursing notes.  Pertinent labs & imaging results that were available during my care of the patient were reviewed by me and considered in my medical decision making (see chart for details).    MDM Rules/Calculators/A&P                          Ms. Scarola presents with atypical central stabbing chest pain, worse on palpation and with movement and improved with breathing. She received ASA 367m but was unable to tolerate NTG en route to the hospital. She has remained afebrile and hemodynamically stable, without tachycardia or tachypnea. History sounds most consistent with musculoskeletal origin of pain, although patient denies any trauma to her chest. Symptoms occurred at rest while she was at hemodialysis. Differential includes pericarditis due to uremia, very unlikely PE given patient is on Eliquis currently with complete hemodynamic stability. Patient has had similar reproducible chest pain in the past with negative cardiac workup.   Will check 12 lead EKG, high sensitivity troponin's, CBC, BMP, CXR.  - Will give tramadol 530mfor pain  CBC shows stable chronic normocytic anemia without leukocytosis.  BMP shows stable chronic kidney disease (slightly improved from June 2021) and BUN is not significantly elevated, making  pericarditis less likely.  Initial troponin is normal at 4.    CXR shows stable mild cardiomegaly and pulmonary vascular congestion, but no overt pulmonary edema and no other acute cardiopulmonary disease. - Second troponin is pending - If pain is controlled and second troponin is normal, patient should be stable for discharge with PCP follow up.  7:15pm: Ms. Peine developed acute agitation with visual and auditory hallucinations. She does have a history of psychosis with similar hallucinations in the past; however, given history of ischemic CVA in the past, will get CT head without contrast to rule out acute intracranial pathology. Will give Lorazepam 64m for agitation.   9:33pm: CT head without contrast shows areas of encephalomalacia involving the left cerebral hemisphere consistent with prior infarcts in this region, as well as findings consistent with age related atrophy and chronic small vessel ischemia, without acute intracranial abnormality.  Second troponin returned normal at 4.  Patient is resting more comfortably without requiring additional Lorazepam. Reassured her that her workup did not show acute neurological or cardiovascular findings. Will discharge her back to her facility with 5 doses of Tramadol for pain / agitation.   Final Clinical Impression(s) / ED Diagnoses Final diagnoses:  Musculoskeletal chest pain  ESRD (end stage renal disease) on dialysis ([place]  Psychosis, unspecified psychosis type (HA[place]  Chronic anemia    Rx / DC Orders ED Discharge Orders         Ordered    traMADol (ULTRAM) 50 MG tablet  Every 6 hours PRN        08/19/20 2157         RJeralyn Bennett MD 08/19/2020, 10:02 PM Pager: [phone]   SJeralyn Bennett MD 08/19/20 2202    YDrenda Freeze MD 08/21/20 [phone]

## 2020-08-19 NOTE — Discharge Instructions (Addendum)
Amber Lara, [name] were evaluated in the emergency department for chest pain and experienced some agitation and hallucinations while you were here. Your cardiac workup was unremarkable and not concerning for cardiac cause of your chest pain. A CT scan of your brain did show findings consistent with significant prior strokes involving the left side of your brain, but did not show any new concerning findings without evidence of new stroke. Your other lab work showed chronic, stable anemia and kidney disease but no concerning findings.   I have prescribed 5 doses of tramadol to your [facility] to use every 6 hours as needed for pain / agitation when you return to your facility.   Please return to the ED if you experience any new weakness, numbness, or worsening CP.  Thank you and take care,  Dr. Konrad Penta

## 2020-08-19 NOTE — ED Triage Notes (Signed)
Pt here via EMS from dialysis, after developing chest pain during the tx. Completed 2 hours of tx. Hx dementia. 324 ASA given PTA, not able to follow directions well enough to put nitro under her tongue. Pt mentation at baseline.

## 2020-08-19 NOTE — ED Notes (Signed)
Ptar called by Berkleigh Beckles pt is number 3 on the list

## 2020-08-19 NOTE — ED Notes (Signed)
Waiting for ptar pickup

## 2020-09-08 ENCOUNTER — Non-Acute Institutional Stay: Payer: Medicare Other | Admitting: Hospice

## 2020-09-08 ENCOUNTER — Other Ambulatory Visit: Payer: Self-pay

## 2020-09-08 DIAGNOSIS — N186 End stage renal disease: Secondary | ICD-10-CM

## 2020-09-08 DIAGNOSIS — Z515 Encounter for palliative care: Secondary | ICD-10-CM

## 2020-09-08 NOTE — Progress Notes (Signed)
[facility] Consult Note Telephone: [phone]  Fax: [phone]  PATIENT NAME: Amber Lara DOB: 01/29/1961 MRN: [id number]  PRIMARY CARE PROVIDER:Dr. Vernell Morgans  REFERRING PROVIDER:Dr. Vernell Morgans  RESPONSIBLE PARTY:Amber Lara - daughter [phone]    RECOMMENDATIONS/PLAN: Visit to build trust and follow up on pallaitive care.  Advance Care Planning/CODE STATUS: Reviewed code status. Patient is a  DO NOT RESUSCITATE.  Signed DNR form in facility. Called Lara and updated her on visit. She expressed appreciation for the call/update. Goals of care:Goals of care include to maximize quality of life and symptom management. Responsible party is also interested in hospice service for patient when she qualifies for it.Palliative care team will continue to support patient, patient's family, and medical team.  Follow XB:WIOMBTDHRC care will continue to follow patient for goals of care clarification and symptom management.  Follow-up in 2 months/as needed  Functional status/Symptom management: Chest pain: seen in the ED 08/19/2020 for muscoskeletal chest pain. No report of chest pain since then; she denies chest pain during visit.  Patient with end-stage renal disease; dependent on  dialysis Mon Wed Friday,well-tolerated.  She is on fluid restriction 1200 mls per day;  able to feed self with tray set up by staff.  She is a high fall risk. Fall precautions in place. 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  FLACC 0. No medical acuity at this time; fairly stable in her chronic conditions. NP encouraged ongoing nursing care.  Family /Caregiver/Community Supports:Patient in SNF for care and supervision. Her daughter Amber Lara is  involved in her care.   I spent 46 minutes providing this consultation; time iincludes time spent with patient/family, chart review, and documentation. More than 50% of the time in this consultation was spent on coordinating communication  HISTORY OF PRESENT ILLNESS:Amber Burrellis a 59 y.o.year oldfemalewith multiple medical problems including this organized schizophrenia,type 2 diabetes mellitus, paranoid personality disorder, End-stage renal disease on dialysis.  Palliative Care was asked to help address goals of care.   CODE STATUS: DNR  PPS: 40%  HOSPICE ELIGIBILITY/DIAGNOSIS: TBD  PAST MEDICAL HISTORY:  Past Medical History:  Diagnosis Date  . Arthritis   . Chronic anemia   . CKD (chronic kidney disease), stage V ([place])   . Diabetes mellitus ([place])   . History of stroke   . Hypertension   . Obesity   . Psychosis ([place])    a. prior adm in [place] in 2015 where pt was admitted with hallucinations, seeing men in black outfits, family members on a non-existent camera, hearing voices and seeing animals - UDS positive for methamphetamines at that time and patient stated she believed someone had put this in her body.  . Seizures ([place])     SOCIAL HX:  Social History   Tobacco Use  . Smoking status: Current Some Day Smoker  . Smokeless tobacco: Never Used  Substance Use Topics  . Alcohol use: No    ALLERGIES:  Allergies  Allergen Reactions  . Penicillins Anaphylaxis and Hives    Has patient had a PCN reaction causing immediate rash, facial/tongue/throat swelling, SOB or lightheadedness with hypotension: yes Has patient had a PCN reaction causing severe rash involving mucus membranes or skin n24} If all of the above answers are "NO", then may proceed with Cephalosporin use.ecrosis: no  Has patient had a PCN reaction that required hospitalization: no Has patient had a PCN reaction occurring within the last 10 years:no  . Tetracyclines &  Related Anaphylaxis and Hives  . Morphine And Related Hives  . Amoxicillin     Same as penicillin  . Latex Itching and Rash  . Tape Itching and Rash     PERTINENT MEDICATIONS:  Outpatient Encounter Medications as of 09/08/2020  Medication Sig  . acetaminophen (TYLENOL) 325 MG tablet Take 2 tablets (650 mg total) by mouth every 6 (six) hours as needed for mild pain or moderate pain (or Fever >/= 101).  Marland Kitchen amLODipine (NORVASC) 10 MG tablet Take 1 tablet (10 mg total) by mouth daily.  Marland Kitchen apixaban (ELIQUIS) 5 MG TABS tablet Take 2 tablets (10 mg total) by mouth 2 (two) times daily for 1 day. (Patient not taking: Reported on 12/18/2018)  . apixaban (ELIQUIS) 5 MG TABS tablet Take 1 tablet (5 mg total) by mouth 2 (two) times daily. (Patient not taking: Reported on 12/18/2018)  . ARIPiprazole (ABILIFY) 5 MG tablet Take 5 mg by mouth daily.  Marland Kitchen atorvastatin (LIPITOR) 40 MG tablet Take 1 tablet (40 mg total) by mouth daily at 6 PM.  . blood glucose meter kit and supplies KIT Dispense based on patient and insurance preference. Use up to four times daily as directed. (FOR ICD-9 250.00, 250.01).  . Ferrous Fumarate (HEMOCYTE - 106 MG FE) 324 (106 Fe) MG TABS tablet Take 1 tablet (106 mg of iron total) by mouth daily.  Marland Kitchen gabapentin (NEURONTIN) 100 MG capsule Take 1 capsule (100 mg total) by mouth 2 (two) times daily.  . insulin aspart (NOVOLOG) 100 UNIT/ML injection Inject 3 times daily based on sliding scale: 125=0 units 126-180=1 unit, 181-200=2 units 201-250 administer= 4 units 251-300 administer =6 units 301 or more 8 units-call provider (Patient not taking: Reported on 12/18/2018)  . insulin glargine (LANTUS) 100 UNIT/ML injection Inject 0.3 mLs (30 Units total) into the skin at bedtime.  . insulin lispro (HUMALOG) 100 UNIT/ML injection Inject 1-8 Units into the skin See admin instructions. Per Sliding Scale at bedtime: 126-180 1 unit 181-200 2 units 201-250 4 units 251-300 6 units 301 or greater 8 units   . Insulin Pen Needle (NOVOFINE) 30G X 8 MM MISC Inject insulin into skin per units prescribed.  . Insulin Syringe-Needle U-100 (TRUEPLUS INSULIN SYRINGE) 31G X 5/16" 0.3 ML MISC Use as directed  . levETIRAcetam (KEPPRA) 500 MG tablet Take 1 tablet (500 mg total) by mouth 2 (two) times daily.  Marland Kitchen lisinopril (PRINIVIL,ZESTRIL) 10 MG tablet Take 1 tablet (10 mg total) by mouth daily. (Patient not taking: Reported on 12/18/2018)  . metoprolol tartrate (LOPRESSOR) 50 MG tablet Take 1 tablet (50 mg total) by mouth 2 (two) times daily.  . Multiple Vitamins-Minerals (CERTAGEN PO) Take 1 tablet by mouth daily.  . polyethylene glycol (MIRALAX) 17 g packet Take 17 g by mouth daily.  . promethazine (PHENERGAN) 25 MG tablet Take 1 tablet (25 mg total) by mouth every 6 (six) hours as needed for nausea or vomiting.  . sevelamer carbonate (RENVELA) 800 MG tablet Take 800 mg by mouth 3 (three) times daily with meals.  . torsemide (DEMADEX) 100 MG tablet Take 100 mg by mouth daily.  . traMADol (ULTRAM) 50 MG tablet Take 1 tablet (50 mg total) by mouth every 6 (six) hours as needed for up to 5 doses for severe pain (or agitation).  . ziprasidone (GEODON) 40 MG capsule  Take 40 mg by mouth 2 (two) times daily with a meal.   Facility-Administered Encounter Medications as of 09/08/2020  Medication  . injection device for insulin    PHYSICAL EXAM/ROS:  General: NAD, sleepy most of visit. Nursing reports that is her normal, day after her dialysis. Cardiovascular: RRR, denies chest pain Pulmonary: clear ant/post fields Abdomen: soft, nontender, + bowel sounds GU: no suprapubic tenderness Extremities: no edema in BLE Skin: no rashes to exposed Neurological: Weakness but otherwise nonfocal  Note:  Portions of this note were generated with Dragon dictation software. Dictation errors may occur despite attempts at proofreading.  Teodoro Spray, NP

## 2020-09-09 ENCOUNTER — Ambulatory Visit: Payer: Medicare Other | Admitting: Podiatry

## 2020-09-18 ENCOUNTER — Encounter (HOSPITAL_COMMUNITY): Payer: Self-pay | Admitting: Emergency Medicine

## 2020-09-18 ENCOUNTER — Inpatient Hospital Stay (HOSPITAL_COMMUNITY)
Admission: EM | Admit: 2020-09-18 | Discharge: 2020-09-27 | DRG: 951 | Disposition: A | Discharge status: Home / Self Care | Attending: Internal Medicine | Admitting: Internal Medicine

## 2020-09-18 ENCOUNTER — Other Ambulatory Visit: Payer: Self-pay

## 2020-09-18 DIAGNOSIS — Z9115 Patient's noncompliance with renal dialysis: Secondary | ICD-10-CM

## 2020-09-18 DIAGNOSIS — Z8673 Personal history of transient ischemic attack (TIA), and cerebral infarction without residual deficits: Secondary | ICD-10-CM

## 2020-09-18 DIAGNOSIS — F17200 Nicotine dependence, unspecified, uncomplicated: Secondary | ICD-10-CM | POA: Diagnosis present

## 2020-09-18 DIAGNOSIS — Z9049 Acquired absence of other specified parts of digestive tract: Secondary | ICD-10-CM

## 2020-09-18 DIAGNOSIS — Z6841 Body Mass Index (BMI) 40.0 and over, adult: Secondary | ICD-10-CM

## 2020-09-18 DIAGNOSIS — Z833 Family history of diabetes mellitus: Secondary | ICD-10-CM

## 2020-09-18 DIAGNOSIS — Z7902 Long term (current) use of antithrombotics/antiplatelets: Secondary | ICD-10-CM

## 2020-09-18 DIAGNOSIS — R296 Repeated falls: Secondary | ICD-10-CM | POA: Diagnosis present

## 2020-09-18 DIAGNOSIS — N186 End stage renal disease: Secondary | ICD-10-CM | POA: Diagnosis present

## 2020-09-18 DIAGNOSIS — G9341 Metabolic encephalopathy: Secondary | ICD-10-CM | POA: Diagnosis present

## 2020-09-18 DIAGNOSIS — Z91048 Other nonmedicinal substance allergy status: Secondary | ICD-10-CM

## 2020-09-18 DIAGNOSIS — Z515 Encounter for palliative care: Principal | ICD-10-CM

## 2020-09-18 DIAGNOSIS — Z888 Allergy status to other drugs, medicaments and biological substances status: Secondary | ICD-10-CM

## 2020-09-18 DIAGNOSIS — Z992 Dependence on renal dialysis: Secondary | ICD-10-CM

## 2020-09-18 DIAGNOSIS — Z794 Long term (current) use of insulin: Secondary | ICD-10-CM

## 2020-09-18 DIAGNOSIS — Z86718 Personal history of other venous thrombosis and embolism: Secondary | ICD-10-CM

## 2020-09-18 DIAGNOSIS — R569 Unspecified convulsions: Secondary | ICD-10-CM | POA: Diagnosis present

## 2020-09-18 DIAGNOSIS — Z20822 Contact with and (suspected) exposure to covid-19: Secondary | ICD-10-CM | POA: Diagnosis present

## 2020-09-18 DIAGNOSIS — Z66 Do not resuscitate: Secondary | ICD-10-CM | POA: Diagnosis present

## 2020-09-18 DIAGNOSIS — F209 Schizophrenia, unspecified: Secondary | ICD-10-CM | POA: Diagnosis present

## 2020-09-18 DIAGNOSIS — Z841 Family history of disorders of kidney and ureter: Secondary | ICD-10-CM

## 2020-09-18 DIAGNOSIS — Z91040 Latex allergy status: Secondary | ICD-10-CM

## 2020-09-18 DIAGNOSIS — R627 Adult failure to thrive: Secondary | ICD-10-CM | POA: Diagnosis present

## 2020-09-18 DIAGNOSIS — E1122 Type 2 diabetes mellitus with diabetic chronic kidney disease: Secondary | ICD-10-CM | POA: Diagnosis present

## 2020-09-18 DIAGNOSIS — I120 Hypertensive chronic kidney disease with stage 5 chronic kidney disease or end stage renal disease: Secondary | ICD-10-CM | POA: Diagnosis present

## 2020-09-18 DIAGNOSIS — Z881 Allergy status to other antibiotic agents status: Secondary | ICD-10-CM

## 2020-09-18 DIAGNOSIS — Z8249 Family history of ischemic heart disease and other diseases of the circulatory system: Secondary | ICD-10-CM

## 2020-09-18 DIAGNOSIS — D631 Anemia in chronic kidney disease: Secondary | ICD-10-CM | POA: Diagnosis present

## 2020-09-18 DIAGNOSIS — Z885 Allergy status to narcotic agent status: Secondary | ICD-10-CM

## 2020-09-18 DIAGNOSIS — Z880 Allergy status to penicillin: Secondary | ICD-10-CM

## 2020-09-18 DIAGNOSIS — F0390 Unspecified dementia without behavioral disturbance: Secondary | ICD-10-CM | POA: Diagnosis present

## 2020-09-18 DIAGNOSIS — Z79899 Other long term (current) drug therapy: Secondary | ICD-10-CM

## 2020-09-18 LAB — CBC WITH DIFFERENTIAL/PLATELET
Abs Immature Granulocytes: 0.05 10*3/uL (ref 0.00–0.07)
Basophils Absolute: 0 10*3/uL (ref 0.0–0.1)
Basophils Relative: 0 %
Eosinophils Absolute: 0.2 10*3/uL (ref 0.0–0.5)
Eosinophils Relative: 3 %
HCT: 29.8 % — ABNORMAL LOW (ref 36.0–46.0)
Hemoglobin: 9.6 g/dL — ABNORMAL LOW (ref 12.0–15.0)
Immature Granulocytes: 1 %
Lymphocytes Relative: 22 %
Lymphs Abs: 1.9 10*3/uL (ref 0.7–4.0)
MCH: 28.9 pg (ref 26.0–34.0)
MCHC: 32.2 g/dL (ref 30.0–36.0)
MCV: 89.8 fL (ref 80.0–100.0)
Monocytes Absolute: 0.6 10*3/uL (ref 0.1–1.0)
Monocytes Relative: 7 %
Neutro Abs: 5.6 10*3/uL (ref 1.7–7.7)
Neutrophils Relative %: 67 %
Platelets: 239 10*3/uL (ref 150–400)
RBC: 3.32 MIL/uL — ABNORMAL LOW (ref 3.87–5.11)
RDW: 15.6 % — ABNORMAL HIGH (ref 11.5–15.5)
WBC: 8.4 10*3/uL (ref 4.0–10.5)
nRBC: 0 % (ref 0.0–0.2)

## 2020-09-18 LAB — COMPREHENSIVE METABOLIC PANEL
ALT: 15 U/L (ref 0–44)
AST: 15 U/L (ref 15–41)
Albumin: 3.7 g/dL (ref 3.5–5.0)
Alkaline Phosphatase: 98 U/L (ref 38–126)
Anion gap: 16 — ABNORMAL HIGH (ref 5–15)
BUN: 102 mg/dL — ABNORMAL HIGH (ref 6–20)
CO2: 21 mmol/L — ABNORMAL LOW (ref 22–32)
Calcium: 9.1 mg/dL (ref 8.9–10.3)
Chloride: 100 mmol/L (ref 98–111)
Creatinine, Ser: 11.77 mg/dL — ABNORMAL HIGH (ref 0.44–1.00)
GFR, Estimated: 3 mL/min — ABNORMAL LOW (ref >60)
Glucose, Bld: 140 mg/dL — ABNORMAL HIGH (ref 70–99)
Potassium: 4.7 mmol/L (ref 3.5–5.1)
Sodium: 137 mmol/L (ref 135–145)
Total Bilirubin: 0.7 mg/dL (ref 0.3–1.2)
Total Protein: 7.8 g/dL (ref 6.5–8.1)

## 2020-09-18 NOTE — ED Triage Notes (Addendum)
Patient arrived with EMS from [facility] requesting hemodialysis, last  treatment last week , respirations unlabored, staff reported that patient is under hospice care .

## 2020-09-18 NOTE — Progress Notes (Addendum)
[facility] Documentation  After hours triage notified of pt transfer from [facility] to [facility] ED. Pt being followed for CKD stage 5 due to HTN and DM, Frequent falls, Early onset dementia, Disorganized schizophrenia, Delusional disorder, Paranoid disorder, Anemia of chronic disease, and Morbid obesity.  Pt is listed as a DNR. Please call [facility] with any questions.   2145 Addendum: Writer spoke with pt's daughter/POA, Trevor Mace, who stated that she does not want pt to have dialysis. Stated that pt was released from the dialysis center for being combative and that she hasn't had dialysis since Monday and she knows that pt only has days left to live. Trevor Mace is upset that [employer] sent pt to the ED without her approval and that they sent her in a t-shirt with no coat or pants.   Daughter does not want pt to return to [facility] and hopes that ED can hold pt until tomorrow when she can be assessed for possible [facility] placement.   Hospice will do anything possible to assist with the safest discharge that can provide this pt the most dignity in her final days.   Please do not hesitate to outreach with any questions.   Thank you,  Freddie Breech, RN  [address] [facility] Triage Nurse [phone]

## 2020-09-19 DIAGNOSIS — Z515 Encounter for palliative care: Secondary | ICD-10-CM | POA: Diagnosis not present

## 2020-09-19 DIAGNOSIS — R627 Adult failure to thrive: Secondary | ICD-10-CM

## 2020-09-19 DIAGNOSIS — N186 End stage renal disease: Secondary | ICD-10-CM

## 2020-09-19 LAB — RESP PANEL BY RT-PCR (FLU A&B, COVID) ARPGX2
Influenza A by PCR: NEGATIVE
Influenza B by PCR: NEGATIVE
SARS Coronavirus 2 by RT PCR: NEGATIVE

## 2020-09-19 LAB — CBG MONITORING, ED: Glucose-Capillary: 116 mg/dL — ABNORMAL HIGH (ref 70–99)

## 2020-09-19 MED ORDER — LORAZEPAM 2 MG/ML IJ SOLN
0.5000 mg | INTRAMUSCULAR | Status: DC | PRN
  Administered 2020-09-19 – 2020-09-21 (×6): 0.5 mg via INTRAVENOUS
  Filled 2020-09-19 (×6): qty 1

## 2020-09-19 MED ORDER — ACETAMINOPHEN 650 MG RE SUPP: 650.0000 mg | Freq: Four times a day (QID) | RECTAL | Status: DC | PRN

## 2020-09-19 MED ORDER — METOPROLOL TARTRATE 50 MG PO TABS
50.0000 mg | ORAL_TABLET | Freq: Two times a day (BID) | ORAL | Status: DC
  Administered 2020-09-19: 21:00:00 50 mg via ORAL
  Filled 2020-09-19 (×2): qty 1

## 2020-09-19 MED ORDER — ACETAMINOPHEN 325 MG PO TABS: 650.0000 mg | ORAL_TABLET | Freq: Four times a day (QID) | ORAL | Status: DC | PRN

## 2020-09-19 MED ORDER — ONDANSETRON HCL 4 MG/2ML IJ SOLN: 4.0000 mg | Freq: Four times a day (QID) | INTRAMUSCULAR | Status: DC | PRN

## 2020-09-19 MED ORDER — OXYCODONE HCL 5 MG PO TABS
5.0000 mg | ORAL_TABLET | ORAL | Status: DC | PRN
  Administered 2020-09-19: 5 mg via ORAL
  Filled 2020-09-19: qty 1

## 2020-09-19 MED ORDER — ONDANSETRON HCL 4 MG PO TABS: 4.0000 mg | ORAL_TABLET | Freq: Four times a day (QID) | ORAL | Status: DC | PRN

## 2020-09-19 MED ORDER — LORAZEPAM 2 MG/ML IJ SOLN: 0.5000 mg | Freq: Once | INTRAMUSCULAR | Status: DC

## 2020-09-19 MED ORDER — POLYETHYLENE GLYCOL 3350 17 G PO PACK: 17.0000 g | PACK | Freq: Every day | ORAL | Status: DC | PRN

## 2020-09-19 NOTE — ED Notes (Signed)
Report given to [facility] RN  Family at bedside  All belongings taken with patient  VSS

## 2020-09-19 NOTE — Progress Notes (Signed)
Brief Palliative Medicine Progress Note:  PMT consult received and chart reviewed.   Noted patient is receiving hospice services with [facility]). Spoke with Domenic Moras, [facility] liaison, who confirms hospice status and that patient will likely be GIP until a bed becomes available at [facility]. Chrislyn reports no PMT needs at this time.  [facility] will reach out to PMT directly if any needs arise.  Thank you for allowing PMT to assist in the care of this patient.  Zeidy Tayag M. [facility] Palliative Medicine Team Team Phone: [phone] NO CHARGE

## 2020-09-19 NOTE — Progress Notes (Signed)
[place] 1O84 [facility]) hospitalized hospice patient visit   Amber Lara is a current hospice patient, admitted onto service on 09/16/20 with a terminal diagnosis of ESRD.  Staff at [facility] activated EMS on 09/18/20 when pt verbalized desire to continue with dialysis, which is not on her hospice plan of care.  Family notified [facility] that EMS had been activated and requested that she not be transported to [facility].  Patient was admitted to [facility] on 09/19/20 with a diagnosis of ESRD for comfort care. Per Dr. Eulas Lara, [facility] MD, this is  a related hospital admission.    Visited with patient at bedside. RN Amber Lara present for visit. Pt alert, disoriented, unable to engage in assessment, repeating phrases over and over.  When asked if she was having pain she did answer, "In my legs." Pt had legs over side of bed and was trying to get up.  Fall precautions discussed who RN who made use of a falls mat.  Spoke with daughter and POA Amber Lara by phone to verify desire for [employer] bed.  Discussion had of giving PO meds for chronic conditions, like seizure and psych conditions, to promote comfort and dignity at end of life.  Amber Lara asked if comfort meds could be ordered rather than being PRN out of concern that RNs might be too busy to give PRN meds.  Made Amber Lara aware that this request would be included in this note. Amber Lara did confirm desire for pt to transfer to [facility] when a bed is available.  Eligibility pending at this time. Vital Signs: 97.9 axillary, 161/61, HR 68, RR 12, 99% RA I&O: -/500 Abnormal Labs:  Co2 21, BUN 102, Creat 11.7, GFR 3, Hgb 9.6 (of note K 4.7, WNL) Diagnostics: none IV/PRN meds: lorazepam 0.5 mg q4h PRN anxiety x 2 doses, oxy IR 5mg  q4h PRN pain x 0 doses Problem List:  Comfort Care Discharge Planning: residential hospice at [place] if deemed eligible. Family clear that they do not want pt to return to [place]: spoke with  POA daughter Amber Lara by phone IDT: Updated Goals of Care: DNR/comfort care Should patient need ambulance transport at time of discharge please contact [facility] as they contract this service for our active hospice patients.   Thank you for the opportunity to participate in this patient's care.     Amber Lara, BSN, RN [facility] Liaison   [phone]

## 2020-09-19 NOTE — Progress Notes (Signed)
[employer] [facility]) Hospital Liaison note.    Ms Hammad is currently on hospice serivces with [facility], admitted 09/16/20 with a terminal diagnosis of ESRD. Report exchanged with Ophelia Charter, RN.  Plan to visit pt and family to discuss goals of care.    At this time, [facility] is unable to offer a room. Hospital Liaison will follow up tomorrow or sooner if a room becomes available. Please do not hesitate to call with questions.    Thank you for the opportunity to participate in this patient's care.  Chrislyn Edison Pace, BSN, RN [place] (listed on [place] under Hospice/[facility])    [phone]

## 2020-09-19 NOTE — ED Notes (Signed)
Daughters at bedside states they do not want anymore dialysis on patient states they are just looking for a place for their mother to transition peacefully.

## 2020-09-19 NOTE — ED Notes (Signed)
Patient in bed, family at bedside.  No issues at this time.

## 2020-09-19 NOTE — H&P (Signed)
History and PhysicalAradia Lara   BDZ:[id number] DOB: May 31, 1961 DOA: 09/18/2020  Referring MD/provider: PA Amber Lara PCP: Patient, No Pcp Per   Patient coming from: Home  Chief Complaint: Requesting comfort care till hospice bed is available.  History is per ED documentation and discussion with her 2 daughters.   History of Present Illness:   Amber Lara is an 59 y.o. female with ESRD, longstanding psychiatric history who is brought in by her daughters for comfort care.  Patient lives in a nursing facility and had been on dialysis for several years however over the past couple of months has been resisting dialysis, has been pulling out her lines has been very agitated during dialysis.  Decision has been made to discontinue dialysis and place patient on hospice.  Patient has been accepted by hospice and is now under their care.  Patient's daughter is her POA.  Patient has not had dialysis in 8 days however apparently at the nursing facility doctor disagrees with the decision to discontinue HD and they sent her to the ED for emergent dialysis.  ED Course:  The patient's situation was discussed and hospice was called.  They were hoping to have a hospice bed at [facility] later today however it appears that there may not be a bed available for couple of days so they are requesting admission pending hospice bed availability.  ROS:   ROS   Review of Systems: Level 5 caveat  Past Medical History:   Past Medical History:  Diagnosis Date  . Arthritis   . Chronic anemia   . CKD (chronic kidney disease), stage V ([place])   . Diabetes mellitus ([place])   . History of stroke   . Hypertension   . Obesity   . Psychosis ([place])    a. prior adm in [place] ([place]) in 2015 where pt was admitted with hallucinations, seeing men in black outfits, family members on a non-existent camera, hearing voices and seeing animals - UDS positive for methamphetamines at that  time and patient stated she believed someone had put this in her body.  . Seizures ([place])     Past Surgical History:   Past Surgical History:  Procedure Laterality Date  . ABDOMINAL HYSTERECTOMY    . AV FISTULA PLACEMENT Right 06/07/2018   Procedure: CREATION OF RIGHT BRACHIOCEPHALIC ARTERIOVENOUS FISTULA;  Surgeon: Serafina Mitchell, MD;  Location: [facility];  Service: Vascular;  Laterality: Right;  . IR DIALY SHUNT INTRO [place] W/IMG RIGHT Right 10/09/2018  . IR US GUIDE VASC ACCESS RIGHT  10/09/2018    Social History:   Social History   Socioeconomic History  . Marital status: Widowed    Spouse name: Not on file  . Number of children: Not on file  . Years of education: Not on file  . Highest education level: Not on file  Occupational History  . Not on file  Tobacco Use  . Smoking status: Current Some Day Smoker  . Smokeless tobacco: Never Used  Vaping Use  . Vaping Use: Never used  Substance and Sexual Activity  . Alcohol use: No  . Drug use: No  . Sexual activity: Not on file  Other Topics Concern  . Not on file  Social History Narrative   ** Merged History Encounter **       Social Determinants of Health   Financial Resource Strain: Not on file  Food Insecurity: Not on file  Transportation Needs: Not on  file  Physical Activity: Not on file  Stress: Not on file  Social Connections: Not on file  Intimate Partner Violence: Not on file    Allergies   Penicillins, Tetracyclines & related, Morphine and related, Amoxicillin, Latex, and Tape  Family history:   Family History  Problem Relation Age of Onset  . Diabetes Mellitus II Mother   . CAD Mother        First MI in her 21s, passed away before age 74  . Renal Disease Mother   . Diabetes Mellitus II Sister   . CAD Sister        Stents in her early 22s  . Renal Disease Sister   . Diabetes Mellitus II Brother     Current Medications:   Prior to Admission medications   Medication Sig  Start Date End Date Taking? Authorizing Provider  acetaminophen (TYLENOL) 325 MG tablet Take 2 tablets (650 mg total) by mouth every 6 (six) hours as needed for mild pain or moderate pain (or Fever >/= 101). 07/15/17   Hongalgi, Lenis Dickinson, MD  amLODipine (NORVASC) 10 MG tablet Take 1 tablet (10 mg total) by mouth daily. 07/23/18   Scot Jun, FNP  apixaban (ELIQUIS) 5 MG TABS tablet Take 2 tablets (10 mg total) by mouth 2 (two) times daily for 1 day. Patient not taking: Reported on 12/18/2018 10/16/18 10/17/18  Shelly Coss, MD  apixaban (ELIQUIS) 5 MG TABS tablet Take 1 tablet (5 mg total) by mouth 2 (two) times daily. Patient not taking: Reported on 12/18/2018 10/18/18 12/11/18  Shelly Coss, MD  ARIPiprazole (ABILIFY) 5 MG tablet Take 5 mg by mouth daily.    [provider]  atorvastatin (LIPITOR) 40 MG tablet Take 1 tablet (40 mg total) by mouth daily at 6 PM. 07/19/18   Scot Jun, FNP  blood glucose meter kit and supplies KIT Dispense based on patient and insurance preference. Use up to four times daily as directed. (FOR ICD-9 250.00, 250.01). 07/23/18   Scot Jun, FNP  Ferrous Fumarate (HEMOCYTE - 106 MG FE) 324 (106 Fe) MG TABS tablet Take 1 tablet (106 mg of iron total) by mouth daily. 07/25/18   Scot Jun, FNP  gabapentin (NEURONTIN) 100 MG capsule Take 1 capsule (100 mg total) by mouth 2 (two) times daily. 07/19/18   Scot Jun, FNP  insulin aspart (NOVOLOG) 100 UNIT/ML injection Inject 3 times daily based on sliding scale: 125=0 units 126-180=1 unit, 181-200=2 units 201-250 administer= 4 units 251-300 administer =6 units 301 or more 8 units-call provider Patient not taking: Reported on 12/18/2018 07/23/18   Scot Jun, FNP  insulin glargine (LANTUS) 100 UNIT/ML injection Inject 0.3 mLs (30 Units total) into the skin at bedtime. 10/16/18 02/29/20  Shelly Coss, MD  insulin lispro (HUMALOG) 100 UNIT/ML injection Inject 1-8 Units into the  skin See admin instructions. Per Sliding Scale at bedtime: 126-180 1 unit 181-200 2 units 201-250 4 units 251-300 6 units 301 or greater 8 units    [provider]  Insulin Pen Needle (NOVOFINE) 30G X 8 MM MISC Inject insulin into skin per units prescribed. 07/19/17   Scot Jun, FNP  Insulin Syringe-Needle U-100 (TRUEPLUS INSULIN SYRINGE) 31G X 5/16" 0.3 ML MISC Use as directed 07/19/18   Scot Jun, FNP  levETIRAcetam (KEPPRA) 500 MG tablet Take 1 tablet (500 mg total) by mouth 2 (two) times daily. 07/19/18 12/18/18  Scot Jun, FNP  lisinopril (PRINIVIL,ZESTRIL) 10 MG  tablet Take 1 tablet (10 mg total) by mouth daily. Patient not taking: Reported on 12/18/2018 10/16/18   Shelly Coss, MD  metoprolol tartrate (LOPRESSOR) 50 MG tablet Take 1 tablet (50 mg total) by mouth 2 (two) times daily. 07/19/18 12/18/18  Scot Jun, FNP  Multiple Vitamins-Minerals (CERTAGEN PO) Take 1 tablet by mouth daily.    [provider]  polyethylene glycol (MIRALAX) 17 g packet Take 17 g by mouth daily. 05/16/20   Khatri, Hina, PA-C  promethazine (PHENERGAN) 25 MG tablet Take 1 tablet (25 mg total) by mouth every 6 (six) hours as needed for nausea or vomiting. 07/19/18   Scot Jun, FNP  sevelamer carbonate (RENVELA) 800 MG tablet Take 800 mg by mouth 3 (three) times daily with meals.    [provider]  torsemide (DEMADEX) 100 MG tablet Take 100 mg by mouth daily.    [provider]  traMADol (ULTRAM) 50 MG tablet Take 1 tablet (50 mg total) by mouth every 6 (six) hours as needed for up to 5 doses for severe pain (or agitation). 08/19/20   Jeralyn Bennett, MD  ziprasidone (GEODON) 40 MG capsule Take 40 mg by mouth 2 (two) times daily with a meal.    [provider]    Physical Exam:   Vitals:   09/19/20 0714 09/19/20 0840 09/19/20 1000 09/19/20 1227  BP: (!) 151/72 (!) 142/70 (!) 142/69 (!) 176/79  Pulse: 63 60 (!) 59 65  Resp:  17 16 16 12   Temp: 97.9 F (36.6 C) 98 F (36.7 C)    TempSrc: Core Axillary    SpO2: 100% 99% 99% 100%  Weight:      Height:         Physical Exam: Blood pressure (!) 176/79, pulse 65, temperature 98 F (36.7 C), temperature source Axillary, resp. rate 12, height 5' 3"  (1.6 m), weight 135 kg, SpO2 100 %. Gen: Comfortable appearing patient lying in bed sleeping with her attentive daughters at bedside.   Data Review:    Labs: Basic Metabolic Panel: Recent Labs  Lab 09/18/20 2015  NA 137  K 4.7  CL 100  CO2 21*  GLUCOSE 140*  BUN 102*  CREATININE 11.77*  CALCIUM 9.1   Liver Function Tests: Recent Labs  Lab 09/18/20 2015  AST 15  ALT 15  ALKPHOS 98  BILITOT 0.7  PROT 7.8  ALBUMIN 3.7   No results for input(s): LIPASE, AMYLASE in the last 168 hours. No results for input(s): AMMONIA in the last 168 hours. CBC: Recent Labs  Lab 09/18/20 2015  WBC 8.4  NEUTROABS 5.6  HGB 9.6*  HCT 29.8*  MCV 89.8  PLT 239   Cardiac Enzymes: No results for input(s): CKTOTAL, CKMB, CKMBINDEX, TROPONINI in the last 168 hours.  BNP (last 3 results) No results for input(s): PROBNP in the last 8760 hours. CBG: Recent Labs  Lab 09/19/20 0758  GLUCAP 116*    Urinalysis    Component Value Date/Time   COLORURINE STRAW (A) 10/06/2018 1330   APPEARANCEUR CLEAR 10/06/2018 1330   LABSPEC 1.010 10/06/2018 1330   PHURINE 5.0 10/06/2018 1330   GLUCOSEU >=500 (A) 10/06/2018 1330   HGBUR SMALL (A) 10/06/2018 1330   BILIRUBINUR NEGATIVE 10/06/2018 1330   KETONESUR NEGATIVE 10/06/2018 1330   PROTEINUR >=300 (A) 10/06/2018 1330   UROBILINOGEN 0.2 08/07/2017 0952   NITRITE NEGATIVE 10/06/2018 1330   LEUKOCYTESUR NEGATIVE 10/06/2018 1330      Radiographic Studies: No results found.  EKG: Not done, not indicated.   Assessment/Plan:   Active Problems:   ESRD (end stage renal disease) ([place])   59 year old female with ESRD is on hospice and is awaiting a bed at [facility]  Comfort care Continue present medications with lorazepam, metoprolol Patient is allergic to morphine so IR oxycodone is substituted for pain management Oxygen as needed Minimal vital signs with heart rate, blood pressure per daughter's request No SCD indicated Hospice is aware and is involved   Other information:   DVT prophylaxis: None ordered. Code Status: DNR Family Communication: Patient's daughters were at bedside throughout Disposition Plan: [place] called: Hospice Admission status: Observation  Susana Gripp Tublu Cassandra Mcmanaman [facility]  If 7PM-7AM, please contact night-coverage [email or web address] Password [facility] 09/19/2020, 12:48 PM

## 2020-09-19 NOTE — ED Provider Notes (Signed)
[facility] EMERGENCY DEPARTMENT Provider Note   CSN: [id number] Arrival date & time: 09/18/20  1920     History Chief Complaint  Patient presents with  . Requesting Hemodialysis    Amber Lara is a 59 y.o. female.  HPI   Level 5 caveat due to psychiatric history.  History obtained from patient's 2 daughters.  According to her daughters, the patient has been declining for some time.  She has a history of ESRD on HD as well as a significant psychiatric history.  She has not had hemodialysis in 8 days.  She has not had a complete hemodialysis session in nearly 30 days.  Apparently, while she is being dialyzed she will become acutely agitated and will pull out her line.  Due to this they have been unable to complete a dialysis session for the past month.  Hospice was notified about the patient and the daughters now have her under hospice care.  They state that the patient will intermittently have lucid moments.  They state that her regular doctor at her nursing facility was concerned that hospice was not necessarily the best decision at this time and sent her to the emergency department for emergent hemodialysis.  They do not feel that patient is going to be able to continue with hemodialysis and because of this, request that she be admitted to the hospital under hospice care and be given comfort care until she passes away.     Past Medical History:  Diagnosis Date  . Arthritis   . Chronic anemia   . CKD (chronic kidney disease), stage V ([place])   . Diabetes mellitus ([place])   . History of stroke   . Hypertension   . Obesity   . Psychosis ([place])    a. prior adm in [place] ([place] in 2015 where pt was admitted with hallucinations, seeing men in black outfits, family members on a non-existent camera, hearing voices and seeing animals - UDS positive for methamphetamines at that time and patient stated she believed someone had put this in her body.  .  Seizures [facility])     Patient Active Problem List   Diagnosis Date Noted  . Pain due to onychomycosis of toenails of both feet 06/03/2020  . History of stroke   . Pressure sore on heel, left, unstageable ([place]) 03/04/2020  . Pressure sore on heel, right, unstageable ([place]) 03/04/2020  . Acute renal failure (ARF) ([place]) 10/09/2018  . Acute deep vein thrombosis (DVT) of femoral vein of right lower extremity ([place]) 10/05/2018  . ESRD (end stage renal disease) ([place])   . CKD (chronic kidney disease), stage V ([place]) 06/04/2018  . Acute on chronic renal failure ([place]) 08/03/2017  . Seizure ([place]) 08/03/2017  . Hyperkalemia 07/14/2017  . Acute kidney injury superimposed on chronic kidney disease ([place]) 07/14/2017  . Type 2 diabetes mellitus with hyperglycemia ([place]) 06/23/2017  . Normochromic normocytic anemia 06/23/2017  . Essential hypertension 06/23/2017    Past Surgical History:  Procedure Laterality Date  . ABDOMINAL HYSTERECTOMY    . AV FISTULA PLACEMENT Right 06/07/2018   Procedure: CREATION OF RIGHT BRACHIOCEPHALIC ARTERIOVENOUS FISTULA;  Surgeon: Serafina Mitchell, MD;  Location: [facility];  Service: Vascular;  Laterality: Right;  . IR DIALY SHUNT INTRO [place] W/IMG RIGHT Right 10/09/2018  . IR US GUIDE VASC ACCESS RIGHT  10/09/2018     OB History   No obstetric history on file.     Family History  Problem Relation Age  of Onset  . Diabetes Mellitus II Mother   . CAD Mother        First MI in her 70s, passed away before age 33  . Renal Disease Mother   . Diabetes Mellitus II Sister   . CAD Sister        Stents in her early 38s  . Renal Disease Sister   . Diabetes Mellitus II Brother     Social History   Tobacco Use  . Smoking status: Current Some Day Smoker  . Smokeless tobacco: Never Used  Vaping Use  . Vaping Use: Never used  Substance Use Topics  . Alcohol use: No  . Drug use: No    Home Medications Prior to Admission medications   Medication Sig  Start Date End Date Taking? Authorizing Provider  acetaminophen (TYLENOL) 325 MG tablet Take 2 tablets (650 mg total) by mouth every 6 (six) hours as needed for mild pain or moderate pain (or Fever >/= 101). 07/15/17   Hongalgi, Lenis Dickinson, MD  amLODipine (NORVASC) 10 MG tablet Take 1 tablet (10 mg total) by mouth daily. 07/23/18   Scot Jun, FNP  apixaban (ELIQUIS) 5 MG TABS tablet Take 2 tablets (10 mg total) by mouth 2 (two) times daily for 1 day. Patient not taking: Reported on 12/18/2018 10/16/18 10/17/18  Shelly Coss, MD  apixaban (ELIQUIS) 5 MG TABS tablet Take 1 tablet (5 mg total) by mouth 2 (two) times daily. Patient not taking: Reported on 12/18/2018 10/18/18 12/11/18  Shelly Coss, MD  ARIPiprazole (ABILIFY) 5 MG tablet Take 5 mg by mouth daily.    [provider]  atorvastatin (LIPITOR) 40 MG tablet Take 1 tablet (40 mg total) by mouth daily at 6 PM. 07/19/18   Scot Jun, FNP  blood glucose meter kit and supplies KIT Dispense based on patient and insurance preference. Use up to four times daily as directed. (FOR ICD-9 250.00, 250.01). 07/23/18   Scot Jun, FNP  Ferrous Fumarate (HEMOCYTE - 106 MG FE) 324 (106 Fe) MG TABS tablet Take 1 tablet (106 mg of iron total) by mouth daily. 07/25/18   Scot Jun, FNP  gabapentin (NEURONTIN) 100 MG capsule Take 1 capsule (100 mg total) by mouth 2 (two) times daily. 07/19/18   Scot Jun, FNP  insulin aspart (NOVOLOG) 100 UNIT/ML injection Inject 3 times daily based on sliding scale: 125=0 units 126-180=1 unit, 181-200=2 units 201-250 administer= 4 units 251-300 administer =6 units 301 or more 8 units-call provider Patient not taking: Reported on 12/18/2018 07/23/18   Scot Jun, FNP  insulin glargine (LANTUS) 100 UNIT/ML injection Inject 0.3 mLs (30 Units total) into the skin at bedtime. 10/16/18 02/29/20  Shelly Coss, MD  insulin lispro (HUMALOG) 100 UNIT/ML injection Inject 1-8 Units into the  skin See admin instructions. Per Sliding Scale at bedtime: 126-180 1 unit 181-200 2 units 201-250 4 units 251-300 6 units 301 or greater 8 units    [provider]  Insulin Pen Needle (NOVOFINE) 30G X 8 MM MISC Inject insulin into skin per units prescribed. 07/19/17   Scot Jun, FNP  Insulin Syringe-Needle U-100 (TRUEPLUS INSULIN SYRINGE) 31G X 5/16" 0.3 ML MISC Use as directed 07/19/18   Scot Jun, FNP  levETIRAcetam (KEPPRA) 500 MG tablet Take 1 tablet (500 mg total) by mouth 2 (two) times daily. 07/19/18 12/18/18  Scot Jun, FNP  lisinopril (PRINIVIL,ZESTRIL) 10 MG tablet Take 1 tablet (10 mg total) by mouth  daily. Patient not taking: Reported on 12/18/2018 10/16/18   Shelly Coss, MD  metoprolol tartrate (LOPRESSOR) 50 MG tablet Take 1 tablet (50 mg total) by mouth 2 (two) times daily. 07/19/18 12/18/18  Scot Jun, FNP  Multiple Vitamins-Minerals (CERTAGEN PO) Take 1 tablet by mouth daily.    [provider]  polyethylene glycol (MIRALAX) 17 g packet Take 17 g by mouth daily. 05/16/20   Khatri, Hina, PA-C  promethazine (PHENERGAN) 25 MG tablet Take 1 tablet (25 mg total) by mouth every 6 (six) hours as needed for nausea or vomiting. 07/19/18   Scot Jun, FNP  sevelamer carbonate (RENVELA) 800 MG tablet Take 800 mg by mouth 3 (three) times daily with meals.    [provider]  torsemide (DEMADEX) 100 MG tablet Take 100 mg by mouth daily.    [provider]  traMADol (ULTRAM) 50 MG tablet Take 1 tablet (50 mg total) by mouth every 6 (six) hours as needed for up to 5 doses for severe pain (or agitation). 08/19/20   Jeralyn Bennett, MD  ziprasidone (GEODON) 40 MG capsule Take 40 mg by mouth 2 (two) times daily with a meal.    [provider]    Allergies    Penicillins, Tetracyclines & related, Morphine and related, Amoxicillin, Latex, and Tape  Review of Systems   Review of Systems  Unable to perform  ROS: Acuity of condition   Physical Exam Updated Vital Signs BP (!) 151/72 (BP Location: Right Arm)   Pulse 63   Temp 97.9 F (36.6 C) (Core)   Resp 17   Ht 5' 3"  (1.6 m)   Wt 135 kg   SpO2 100%   BMI 52.72 kg/m   Physical Exam Vitals and nursing note reviewed.  Constitutional:      Appearance: She is well-developed.     Comments: Sleeping in bed  HENT:     Head: Normocephalic and atraumatic.     Right Ear: External ear normal.     Left Ear: External ear normal.     Nose: Nose normal.  Neck:     Trachea: No tracheal deviation.  Cardiovascular:     Rate and Rhythm: Normal rate.  Pulmonary:     Effort: Pulmonary effort is normal. No respiratory distress.     Breath sounds: No stridor.     Comments: No tachypnea.  Oxygen saturations around 100% on room air. Abdominal:     General: There is no distension.  Musculoskeletal:        General: No swelling or deformity.     Cervical back: Neck supple.  Skin:    General: Skin is warm and dry.     Findings: No rash.  Neurological:     Cranial Nerves: Cranial nerve deficit: no gross deficits.    ED Results / Procedures / Treatments   Labs (all labs ordered are listed, but only abnormal results are displayed) Labs Reviewed  CBC WITH DIFFERENTIAL/PLATELET - Abnormal; Notable for the following components:      Result Value   RBC 3.32 (*)    Hemoglobin 9.6 (*)    HCT 29.8 (*)    RDW 15.6 (*)    All other components within normal limits  COMPREHENSIVE METABOLIC PANEL - Abnormal; Notable for the following components:   CO2 21 (*)    Glucose, Bld 140 (*)    BUN 102 (*)    Creatinine, Ser 11.77 (*)    GFR, Estimated 3 (*)  Anion gap 16 (*)    All other components within normal limits  CBG MONITORING, ED - Abnormal; Notable for the following components:   Glucose-Capillary 116 (*)    All other components within normal limits  RESP PANEL BY RT-PCR (FLU A&B, COVID) ARPGX2   EKG None  Radiology No results  found.  Procedures Procedures   Medications Ordered in ED Medications  LORazepam (ATIVAN) injection 0.5 mg (has no administration in time range)  oxyCODONE (Oxy IR/ROXICODONE) immediate release tablet 5 mg (has no administration in time range)  metoprolol tartrate (LOPRESSOR) tablet 50 mg (has no administration in time range)   ED Course  I have reviewed the triage vital signs and the nursing notes.  Pertinent labs & imaging results that were available during my care of the patient were reviewed by me and considered in my medical decision making (see chart for details).    MDM Rules/Calculators/A&P                          Patient is a 59 year old female under hospice care.  Has been unable to complete a full dialysis session for nearly a month and has not been dialyzed in 8 days.  Her 2 daughters are at her bedside.  One is a medical doctor and the other is her power of attorney.  They both agree that patient needs to be under hospice care and request that we DO NOT dialyze her at this time.  They requested that she be admitted to the hospital and be allowed to pass away with comfort care.  Patient was discussed with the palliative care team.  They recommend admission with the hospitalist team and they will follow.  Patient has also been discussed with the hospice team.  They currently do not have a room at [facility].  They also recommend admission and they will follow.  They are going to meet the patient and her family later today.  Requested that I start patients current q4 Ativan, q4 oxycodone, 50 mg Lopressor twice daily.  Patient also has Haldol listed on her regular medications but her family states that she has adverse reactions to Haldol.  Will obtain a respiratory panel and will admit at this time.  Final Clinical Impression(s) / ED Diagnoses Final diagnoses:  Failure to thrive in adult    Rx / DC Orders ED Discharge Orders    None       Rayna Sexton,  PA-C 09/19/20 1157    Long, Wonda Old[place], MD 09/20/20 [phone]

## 2020-09-19 NOTE — ED Notes (Signed)
Daughters at bedside  Update given.

## 2020-09-20 DIAGNOSIS — E1122 Type 2 diabetes mellitus with diabetic chronic kidney disease: Secondary | ICD-10-CM | POA: Diagnosis present

## 2020-09-20 DIAGNOSIS — Z7902 Long term (current) use of antithrombotics/antiplatelets: Secondary | ICD-10-CM | POA: Diagnosis not present

## 2020-09-20 DIAGNOSIS — F209 Schizophrenia, unspecified: Secondary | ICD-10-CM | POA: Diagnosis present

## 2020-09-20 DIAGNOSIS — Z79899 Other long term (current) drug therapy: Secondary | ICD-10-CM | POA: Diagnosis not present

## 2020-09-20 DIAGNOSIS — Z9049 Acquired absence of other specified parts of digestive tract: Secondary | ICD-10-CM | POA: Diagnosis not present

## 2020-09-20 DIAGNOSIS — Z86718 Personal history of other venous thrombosis and embolism: Secondary | ICD-10-CM | POA: Diagnosis not present

## 2020-09-20 DIAGNOSIS — Z8673 Personal history of transient ischemic attack (TIA), and cerebral infarction without residual deficits: Secondary | ICD-10-CM | POA: Diagnosis not present

## 2020-09-20 DIAGNOSIS — Z833 Family history of diabetes mellitus: Secondary | ICD-10-CM | POA: Diagnosis not present

## 2020-09-20 DIAGNOSIS — G9341 Metabolic encephalopathy: Secondary | ICD-10-CM | POA: Diagnosis present

## 2020-09-20 DIAGNOSIS — Z20822 Contact with and (suspected) exposure to covid-19: Secondary | ICD-10-CM | POA: Diagnosis present

## 2020-09-20 DIAGNOSIS — I120 Hypertensive chronic kidney disease with stage 5 chronic kidney disease or end stage renal disease: Secondary | ICD-10-CM | POA: Diagnosis present

## 2020-09-20 DIAGNOSIS — Z6841 Body Mass Index (BMI) 40.0 and over, adult: Secondary | ICD-10-CM | POA: Diagnosis not present

## 2020-09-20 DIAGNOSIS — R627 Adult failure to thrive: Secondary | ICD-10-CM | POA: Diagnosis present

## 2020-09-20 DIAGNOSIS — D631 Anemia in chronic kidney disease: Secondary | ICD-10-CM | POA: Diagnosis present

## 2020-09-20 DIAGNOSIS — F0390 Unspecified dementia without behavioral disturbance: Secondary | ICD-10-CM | POA: Diagnosis present

## 2020-09-20 DIAGNOSIS — Z8249 Family history of ischemic heart disease and other diseases of the circulatory system: Secondary | ICD-10-CM | POA: Diagnosis not present

## 2020-09-20 DIAGNOSIS — Z515 Encounter for palliative care: Secondary | ICD-10-CM

## 2020-09-20 DIAGNOSIS — R296 Repeated falls: Secondary | ICD-10-CM | POA: Diagnosis present

## 2020-09-20 DIAGNOSIS — Z66 Do not resuscitate: Secondary | ICD-10-CM | POA: Diagnosis present

## 2020-09-20 DIAGNOSIS — N186 End stage renal disease: Secondary | ICD-10-CM | POA: Diagnosis present

## 2020-09-20 DIAGNOSIS — F17200 Nicotine dependence, unspecified, uncomplicated: Secondary | ICD-10-CM | POA: Diagnosis present

## 2020-09-20 DIAGNOSIS — R569 Unspecified convulsions: Secondary | ICD-10-CM | POA: Diagnosis present

## 2020-09-20 DIAGNOSIS — Z992 Dependence on renal dialysis: Secondary | ICD-10-CM | POA: Diagnosis not present

## 2020-09-20 MED ORDER — HYDROMORPHONE HCL 1 MG/ML IJ SOLN
0.5000 mg | INTRAMUSCULAR | Status: DC | PRN
  Administered 2020-09-22: 0.5 mg via INTRAVENOUS
  Filled 2020-09-20: qty 1

## 2020-09-20 MED ORDER — HALOPERIDOL 0.5 MG PO TABS
0.5000 mg | ORAL_TABLET | ORAL | Status: DC | PRN
  Filled 2020-09-20: qty 1

## 2020-09-20 MED ORDER — HYDROMORPHONE HCL 1 MG/ML IJ SOLN
0.5000 mg | Freq: Two times a day (BID) | INTRAMUSCULAR | Status: DC
  Administered 2020-09-20 – 2020-09-23 (×7): 0.5 mg via INTRAVENOUS
  Filled 2020-09-20 (×7): qty 1

## 2020-09-20 MED ORDER — HALOPERIDOL LACTATE 2 MG/ML PO CONC
0.5000 mg | ORAL | Status: DC | PRN
  Administered 2020-09-21: 01:00:00 0.5 mg via SUBLINGUAL
  Filled 2020-09-20 (×2): qty 0.3

## 2020-09-20 MED ORDER — HALOPERIDOL LACTATE 5 MG/ML IJ SOLN
0.5000 mg | INTRAMUSCULAR | Status: DC | PRN
  Administered 2020-09-20 – 2020-09-24 (×5): 0.5 mg via INTRAVENOUS
  Filled 2020-09-20 (×6): qty 1

## 2020-09-20 NOTE — Progress Notes (Signed)
[place] [id number]) hospitalized hospice patient visit   Amber Lara is a current hospice patient, admitted onto service on 09/16/20 with a terminal diagnosis of ESRD.  Staff at [facility] activated EMS on 09/18/20 when pt verbalized desire to continue with dialysis, which is not on her hospice plan of care.  Family notified [facility] that EMS had been activated and requested that she not be transported to [facility]  Patient was admitted to [facility] on 09/19/20 with a diagnosis of ESRD for comfort care. Per Dr. Eulas Post, [facility] MD, this is  a related hospital admission.    Visited with patient and daughter Amber Lara at bedside. Pt alert but disoriented to self and place. Lying comfortable and peaceful in bed. Daughter stated no episodes of outburst or being in pain since admission. Upon speaking patient opened eyes and was able to tell us she was wet and needed cleaned.  At this time family only wants comfort medications given and we discussed increasing Ativan and pain medications as needed. Could even include Depakote for symptom management if family desires. Amber Lara has discussed the desire for [employer]. Eligibility pending at this time.  Vital Signs: 98 axillary, 157/68, HR 74, RR 20, 99% RA I&O: 0? Abnormal Labs:  09/18/2020 20:15 CO2: 21 (L) Glucose: 140 (H) BUN: 102 (H) Creatinine: 11.77 (H) Calcium: 9.1 Anion gap: 16 (H) GFR, Estimated: 3 (L) RBC: 3.32 (L) Hemoglobin: 9.6 (L) HCT: 29.8 (L) RDW: 15.6 (H)  Diagnostics: none IV/PRN meds:    HYDROmorphone (DILAUDID) injection 0.5 mg  0.5 mg,   Intravenous,   2 times daily   PRN:  09/20/20 1107   HYDROmorphone (DILAUDID) injection 0.5 mg  0.5 mg,   Intravenous,   Every 4 hours PRN       09/20/20 1107 -- 09/20/20 0919   haloperidol (HALDOL) tablet 0.5 mg  0.5 mg,   Oral,   Every 4 hours PRN      "Or" Linked Group Details  09/20/20 0918 -- 09/20/20 0919   haloperidol (HALDOL) 2 MG/ML solution 0.5 mg   0.5 mg,   Sublingual,   Every 4 hours PRN             Problem List:        ESRD on HD Psychiatric disorder Patient presenting from [facility] by direction of nursing home provider for need of emergent dialysis. Patient has not had full dialysis session the past 30 days with last partial session 8 days prior. Patient's family requests transition to comfort measures as they believe she will not be able to continue hemodialysis in the future given her progressive psychiatric history and noncompliance with HD sessions. Patient follows with hospice, [facility] outpatient. --On comfort measures, DNR --Haldol/lorazepam as needed agitation --Oxycodone 5 mg p.o. every 4 hours as needed pain --Pending residential hospice bed      Discharge Planning: residential hospice at [place] if deemed eligible. Family clear that they do not want pt to return to [facility]. Patient has been given 2 weeks or less.  Family Contact: met Amber Lara and updated at bedside IDT: Updated Goals of Care: DNR/comfort care  Should patient need ambulance transport at time of discharge please contact [facility] as they contract this service for our active hospice patients.   Thank you for the opportunity to participate in this patient's care.  Clementeen Hoof, BSN, [facility] (in [place]) [phone]

## 2020-09-20 NOTE — TOC Progression Note (Addendum)
Transition of Care [facility]) - Progression Note    Patient Details  Name: Amber Lara MRN: [id number] Date of Birth: Feb 13, 1961  Transition of Care [facility]) CM/SW [place], LCSW Phone Number: 09/20/2020, 8:24 AM  Clinical Narrative: CSW contacted [facility] and confirmed patient is on their waitlist. CSW notes facility will send their liaison a message to reach out to CSW to confirm whether there is bed availability or not.     8:35a: No hospice bed available today.          Expected Discharge Plan and Services                                                 Social Determinants of Health (SDOH) Interventions    Readmission Risk Interventions No flowsheet data found.

## 2020-09-20 NOTE — Progress Notes (Signed)
Pt's daughter called RN into the room and stated that she was feeding her mother soup and she started choking a bit and would like a " congestion test" to see how much fluid was on the patient. RN told her I would page MD and follow up, Dr. [place] paged and notified, will continue to monitor patient. She was able to tolerate her mashed potatoes and pudding.

## 2020-09-20 NOTE — Progress Notes (Signed)
PROGRESS NOTE    Amber Lara  VZC:[id number] DOB: 1961/10/08 DOA: 09/18/2020 PCP: Patient, No Pcp Per    Brief Narrative:  Amber Lara is a 59 year old female with past medical history significant for ESRD on HD, essential hypertension, diabetes mellitus, frequent falls, schizophrenia, delusional disorder, paranoid disorder, early onset dementia, anemia of chronic kidney disease and morbid obesity who presented from [place] by nursing home physician for emergent dialysis. Patient has been declining for some time, and has not had a complete hemodialysis session in nearly 30 days with last HD 8 days prior. While being dialyzed, she tends to become acutely agitated and will pull out her line. Apparently, her regular doctor was concerned that hospice was not in the sort the best decision and sent her to the emergency department.  In the ED, temperature 97.9, HR 67, RR 16, BP 151/66, SPO2 100% on room air. WBC 8.4, hemoglobin 9.6, platelets 239. Sodium 137, potassium 4.7, chloride 100, CO2 21, BUN 102, creatinine 11.77, glucose 140. SARS-CoV-2/influenza A/B PCR negative. Patient's family, daughter believes that she will not be able to continue hemodialysis and request hospital admission under hospice care with comfort measures until residential hospice facility can accept. Hospitalist service consulted for further evaluation and management.   Assessment & Plan:   Active Problems:   ESRD (end stage renal disease) ([place])   ESRD on HD Psychiatric disorder Patient presenting from [place] by direction of nursing home provider for need of emergent dialysis. Patient has not had full dialysis session the past 30 days with last partial session 8 days prior. Patient's family requests transition to comfort measures as they believe she will not be able to continue hemodialysis in the future given her progressive psychiatric history and noncompliance with HD sessions. Patient follows with  hospice, [facility] outpatient. --On comfort measures, DNR --Haldol/lorazepam as needed agitation --Oxycodone 5 mg p.o. every 4 hours as needed pain --Pending residential hospice bed   DVT prophylaxis: None, on comfort measures Code Status: DNR Family Communication: No family present at bedside this morning  Disposition Plan:  Status is: Observation  The patient remains OBS appropriate and will d/c before 2 midnights.  Dispo: The patient is from: SNF              Anticipated d/c is to: Residential hospice              Anticipated d/c date is: 1 day              Patient currently is medically stable to d/c.    Consultants:   Hospice  Procedures:   None  Antimicrobials:   None   Subjective: Patient seen and examined bedside, resting comfortably in bed. Sleeping and unresponsive this morning. No family present at bedside. Awaiting residential hospice bed at [facility]. No acute concerns per nursing staff this morning  Objective: Vitals:   09/19/20 1300 09/19/20 1501 09/19/20 2250 09/20/20 0334  BP: (!) 147/74 (!) 161/61 (!) 159/64 (!) 157/68  Pulse: 64 68 67 72  Resp: 11 12 18 20   Temp:      TempSrc:      SpO2: 100% 99% 99% 99%  Weight:      Height:        Intake/Output Summary (Last 24 hours) at 09/20/2020 0920 Last data filed at 09/19/2020 2130 Gross per 24 hour  Intake 240 ml  Output 500 ml  Net -260 ml   Filed Weights   09/18/20 2012  Weight:  135 kg    Examination:  General exam: Appears calm and comfortable, unresponsive to verbal command Respiratory system: Clear to auscultation. Respiratory effort normal. On room air Cardiovascular system: S1 & S2 heard, RRR. No JVD, murmurs, rubs, gallops or clicks. No pedal edema. Gastrointestinal system: Abdomen is nondistended, soft and nontender. No organomegaly or masses felt. Normal bowel sounds heard. Central nervous system: Unresponsive Skin: No rashes, lesions or ulcers    Data Reviewed: I  have personally reviewed following labs and imaging studies  CBC: Recent Labs  Lab 09/18/20 2015  WBC 8.4  NEUTROABS 5.6  HGB 9.6*  HCT 29.8*  MCV 89.8  PLT 277   Basic Metabolic Panel: Recent Labs  Lab 09/18/20 2015  NA 137  K 4.7  CL 100  CO2 21*  GLUCOSE 140*  BUN 102*  CREATININE 11.77*  CALCIUM 9.1   GFR: Estimated Creatinine Clearance: 7 mL/min (A) (by C-G formula based on SCr of 11.77 mg/dL (H)). Liver Function Tests: Recent Labs  Lab 09/18/20 2015  AST 15  ALT 15  ALKPHOS 98  BILITOT 0.7  PROT 7.8  ALBUMIN 3.7   No results for input(s): LIPASE, AMYLASE in the last 168 hours. No results for input(s): AMMONIA in the last 168 hours. Coagulation Profile: No results for input(s): INR, PROTIME in the last 168 hours. Cardiac Enzymes: No results for input(s): CKTOTAL, CKMB, CKMBINDEX, TROPONINI in the last 168 hours. BNP (last 3 results) No results for input(s): PROBNP in the last 8760 hours. HbA1C: No results for input(s): HGBA1C in the last 72 hours. CBG: Recent Labs  Lab 09/19/20 0758  GLUCAP 116*   Lipid Profile: No results for input(s): CHOL, HDL, LDLCALC, TRIG, CHOLHDL, LDLDIRECT in the last 72 hours. Thyroid Function Tests: No results for input(s): TSH, T4TOTAL, FREET4, T3FREE, THYROIDAB in the last 72 hours. Anemia Panel: No results for input(s): VITAMINB12, FOLATE, FERRITIN, TIBC, IRON, RETICCTPCT in the last 72 hours. Sepsis Labs: No results for input(s): PROCALCITON, LATICACIDVEN in the last 168 hours.  Recent Results (from the past 240 hour(s))  Resp Panel by RT-PCR (Flu A&B, Covid) Nasopharyngeal Swab     Status: None   Collection Time: 09/19/20  8:46 AM   Specimen: Nasopharyngeal Swab; Nasopharyngeal(NP) swabs in vial transport medium  Result Value Ref Range Status   SARS Coronavirus 2 by RT PCR NEGATIVE NEGATIVE Final    Comment: (NOTE) SARS-CoV-2 target nucleic acids are NOT DETECTED.  The SARS-CoV-2 RNA is generally detectable  in upper respiratory specimens during the acute phase of infection. The lowest concentration of SARS-CoV-2 viral copies this assay can detect is 138 copies/mL. A negative result does not preclude SARS-Cov-2 infection and should not be used as the sole basis for treatment or other patient management decisions. A negative result may occur with  improper specimen collection/handling, submission of specimen other than nasopharyngeal swab, presence of viral mutation(s) within the areas targeted by this assay, and inadequate number of viral copies(<138 copies/mL). A negative result must be combined with clinical observations, patient history, and epidemiological information. The expected result is Negative.  Fact Sheet for Patients:  EntrepreneurPulse.com.au  Fact Sheet for Healthcare Providers:  IncredibleEmployment.be  This test is no t yet approved or cleared by the [place] FDA and  has been authorized for detection and/or diagnosis of SARS-CoV-2 by FDA under an Emergency Use Authorization (EUA). This EUA will remain  in effect (meaning this test can be used) for the duration of the COVID-19 declaration under Section 564(b)(1)  of the Act, 21 U.S.C.section 360bbb-3(b)(1), unless the authorization is terminated  or revoked sooner.       Influenza A by PCR NEGATIVE NEGATIVE Final   Influenza B by PCR NEGATIVE NEGATIVE Final    Comment: (NOTE) The Xpert Xpress SARS-CoV-2/FLU/RSV plus assay is intended as an aid in the diagnosis of influenza from Nasopharyngeal swab specimens and should not be used as a sole basis for treatment. Nasal washings and aspirates are unacceptable for Xpert Xpress SARS-CoV-2/FLU/RSV testing.  Fact Sheet for Patients: EntrepreneurPulse.com.au  Fact Sheet for Healthcare Providers: IncredibleEmployment.be  This test is not yet approved or cleared by the [place] FDA and has  been authorized for detection and/or diagnosis of SARS-CoV-2 by FDA under an Emergency Use Authorization (EUA). This EUA will remain in effect (meaning this test can be used) for the duration of the COVID-19 declaration under Section 564(b)(1) of the Act, 21 U.S.C. section 360bbb-3(b)(1), unless the authorization is terminated or revoked.  Performed at [facility], [place] [address], [place],  [address]          Radiology Studies: No results found.      Scheduled Meds: . metoprolol tartrate  50 mg Oral BID   Continuous Infusions:   LOS: 0 days    Time spent: 25 minutes spent on chart review, discussion with nursing staff, consultants, updating family and interview/physical exam; more than 50% of that time was spent in counseling and/or coordination of care.    Kendrick Haapala J [place] DO [facility] Available via Epic secure chat 7am-7pm After these hours, please refer to coverage provider listed on [email or web address] 09/20/2020, 9:20 AM

## 2020-09-21 ENCOUNTER — Encounter (HOSPITAL_COMMUNITY): Payer: Self-pay | Admitting: Internal Medicine

## 2020-09-21 NOTE — Progress Notes (Signed)
[place] 8P10 [facility]) Hospitalized Hospice Patient   Amber Lara is a current hospice patient, admitted to hospice services on 09/16/20 with a terminal diagnosis of ESRD. Staff at [facility] activated EMS on 09/18/20 when patient verbalized desire to continue with dialysis. Patient was admitted to [facility] on 09/19/20 with a diagnosis of ESRD for comfort care. Per Dr. Eulas Post, [facility] MD, this is a related hospital admission.   Visited patient at the bedside, no visitors present in the room. Pt alert and calm. Offered oral intake, pt advised she was "not interested". No symptoms that are not currently being managed by hospital interventions. Plan to transfer to [facility] for end of life care once a bed is available.  V/S: 97.1, 161.78, HR 75, RR 16, SPO2 97% on RA I&O, labs, diagnostics: full comfort measures, awaiting bed placement at [facility]. IV's/PRNs: dilaudid 0.5 mg IV x 2, haldol 0.5 mg SL x 1, ativan 0.5 mg IV x 2  Problem List: - ESRD - no further options for dialysis - metabolic encephalopathy - patient is alert and calm during this visit  [place]: clear, full comfort D/C planning: d/c to [facility], no bed is available today Family: will update IDT: hospice team updated  Venia Carbon RN, BSN, [facility] Liaison

## 2020-09-21 NOTE — Progress Notes (Signed)
Manufacturing engineer [facility]) Hospital Liaison note.    Chart and pt information have been reviewed by [facility] physician. Patient has been deemed appropriate for residential hospice at [facility].  [facility] is unable to offer a room today. Hospital Liaison will follow up tomorrow or sooner if a room becomes available. Please do not hesitate to call with questions.    Thank you for the opportunity to participate in this patient's care.  Chrislyn Edison Pace, BSN, RN [place] (listed on [place] under Hospice/[facility])    [phone]

## 2020-09-21 NOTE — Progress Notes (Signed)
PROGRESS NOTE    Amber Lara  ZJI:[id number] DOB: [birth year] DOA: 09/18/2020 PCP: Patient, No Pcp Per    Brief Narrative:  Amber Lara is a 59 year old female with past medical history significant for ESRD on HD, essential hypertension, diabetes mellitus, frequent falls, schizophrenia, delusional disorder, paranoid disorder, early onset dementia, anemia of chronic kidney disease and morbid obesity who presented from [place] by nursing home physician for emergent dialysis. Patient has been declining for some time, and has not had a complete hemodialysis session in nearly 30 days with last HD 8 days prior. While being dialyzed, she tends to become acutely agitated and will pull out her line. Apparently, her regular doctor was concerned that hospice was not in the sort the best decision and sent her to the emergency department.  In the ED, temperature 97.9, HR 67, RR 16, BP 151/66, SPO2 100% on room air. WBC 8.4, hemoglobin 9.6, platelets 239. Sodium 137, potassium 4.7, chloride 100, CO2 21, BUN 102, creatinine 11.77, glucose 140. SARS-CoV-2/influenza A/B PCR negative. Patient's family, daughter believes that she will not be able to continue hemodialysis and request hospital admission under hospice care with comfort measures until residential hospice facility can accept. Hospitalist service consulted for further evaluation and management.   Assessment & Plan:   Principal Problem:   Comfort measures only status Active Problems:   ESRD (end stage renal disease) ([place])   Schizophrenia ([place])   Acute metabolic encephalopathy   ESRD on HD Psychiatric disorder Patient presenting from [place] by direction of nursing home provider for need of emergent dialysis. Patient has not had full dialysis session the past 30 days with last partial session 8 days prior. Patient's family requests transition to comfort measures as they believe she will not be able to continue hemodialysis in  the future given her progressive psychiatric history and noncompliance with HD sessions. Patient follows with hospice, [facility] outpatient. --On comfort measures, DNR --Haldol/lorazepam as needed agitation --Oxycodone 5 mg p.o. every 4 hours as needed pain --Pending residential hospice bed   DVT prophylaxis: None, on comfort measures Code Status: DNR Family Communication: No family present at bedside this morning  Disposition Plan:  Status is: Observation  The patient remains OBS appropriate and will d/c before 2 midnights.  Dispo: The patient is from: SNF              Anticipated d/c is to: Residential hospice              Anticipated d/c date is: 1 day              Patient currently is medically stable to d/c.    Consultants:   Hospice  Procedures:   None  Antimicrobials:   None   Subjective: Patient seen and examined bedside, resting comfortably in bed. Sleeping and unresponsive this morning. No family present at bedside. Awaiting residential hospice bed at [facility]. No acute concerns per nursing staff this morning  Objective: Vitals:   09/19/20 2250 09/20/20 0334 09/20/20 2100 09/21/20 0509  BP: (!) 159/64 (!) 157/68 (!) 162/70 (!) 160/74  Pulse: 67 72 74 70  Resp: 18 20 16 16   Temp:      TempSrc:      SpO2: 99% 99% 97% 96%  Weight:      Height:        Intake/Output Summary (Last 24 hours) at 09/21/2020 1008 Last data filed at 09/20/2020 2300 Gross per 24 hour  Intake --  Output  900 ml  Net -900 ml   Filed Weights   09/18/20 2012  Weight: 135 kg    Examination:  General exam: Appears calm and comfortable, unresponsive to verbal command Respiratory system: Clear to auscultation. Respiratory effort normal. On room air Cardiovascular system: S1 & S2 heard, RRR. No JVD, murmurs, rubs, gallops or clicks. No pedal edema. Gastrointestinal system: Abdomen is nondistended, soft and nontender. No organomegaly or masses felt. Normal bowel sounds  heard. Central nervous system: Unresponsive Skin: No rashes, lesions or ulcers    Data Reviewed: I have personally reviewed following labs and imaging studies  CBC: Recent Labs  Lab 09/18/20 2015  WBC 8.4  NEUTROABS 5.6  HGB 9.6*  HCT 29.8*  MCV 89.8  PLT 456   Basic Metabolic Panel: Recent Labs  Lab 09/18/20 2015  NA 137  K 4.7  CL 100  CO2 21*  GLUCOSE 140*  BUN 102*  CREATININE 11.77*  CALCIUM 9.1   GFR: Estimated Creatinine Clearance: 7 mL/min (A) (by C-G formula based on SCr of 11.77 mg/dL (H)). Liver Function Tests: Recent Labs  Lab 09/18/20 2015  AST 15  ALT 15  ALKPHOS 98  BILITOT 0.7  PROT 7.8  ALBUMIN 3.7   No results for input(s): LIPASE, AMYLASE in the last 168 hours. No results for input(s): AMMONIA in the last 168 hours. Coagulation Profile: No results for input(s): INR, PROTIME in the last 168 hours. Cardiac Enzymes: No results for input(s): CKTOTAL, CKMB, CKMBINDEX, TROPONINI in the last 168 hours. BNP (last 3 results) No results for input(s): PROBNP in the last 8760 hours. HbA1C: No results for input(s): HGBA1C in the last 72 hours. CBG: Recent Labs  Lab 09/19/20 0758  GLUCAP 116*   Lipid Profile: No results for input(s): CHOL, HDL, LDLCALC, TRIG, CHOLHDL, LDLDIRECT in the last 72 hours. Thyroid Function Tests: No results for input(s): TSH, T4TOTAL, FREET4, T3FREE, THYROIDAB in the last 72 hours. Anemia Panel: No results for input(s): VITAMINB12, FOLATE, FERRITIN, TIBC, IRON, RETICCTPCT in the last 72 hours. Sepsis Labs: No results for input(s): PROCALCITON, LATICACIDVEN in the last 168 hours.  Recent Results (from the past 240 hour(s))  Resp Panel by RT-PCR (Flu A&B, Covid) Nasopharyngeal Swab     Status: None   Collection Time: 09/19/20  8:46 AM   Specimen: Nasopharyngeal Swab; Nasopharyngeal(NP) swabs in vial transport medium  Result Value Ref Range Status   SARS Coronavirus 2 by RT PCR NEGATIVE NEGATIVE Final    Comment:  (NOTE) SARS-CoV-2 target nucleic acids are NOT DETECTED.  The SARS-CoV-2 RNA is generally detectable in upper respiratory specimens during the acute phase of infection. The lowest concentration of SARS-CoV-2 viral copies this assay can detect is 138 copies/mL. A negative result does not preclude SARS-Cov-2 infection and should not be used as the sole basis for treatment or other patient management decisions. A negative result may occur with  improper specimen collection/handling, submission of specimen other than nasopharyngeal swab, presence of viral mutation(s) within the areas targeted by this assay, and inadequate number of viral copies(<138 copies/mL). A negative result must be combined with clinical observations, patient history, and epidemiological information. The expected result is Negative.  Fact Sheet for Patients:  EntrepreneurPulse.com.au  Fact Sheet for Healthcare Providers:  IncredibleEmployment.be  This test is no t yet approved or cleared by the [place] FDA and  has been authorized for detection and/or diagnosis of SARS-CoV-2 by FDA under an Emergency Use Authorization (EUA). This EUA will remain  in effect (  meaning this test can be used) for the duration of the COVID-19 declaration under Section 564(b)(1) of the Act, 21 U.S.C.section 360bbb-3(b)(1), unless the authorization is terminated  or revoked sooner.       Influenza A by PCR NEGATIVE NEGATIVE Final   Influenza B by PCR NEGATIVE NEGATIVE Final    Comment: (NOTE) The Xpert Xpress SARS-CoV-2/FLU/RSV plus assay is intended as an aid in the diagnosis of influenza from Nasopharyngeal swab specimens and should not be used as a sole basis for treatment. Nasal washings and aspirates are unacceptable for Xpert Xpress SARS-CoV-2/FLU/RSV testing.  Fact Sheet for Patients: EntrepreneurPulse.com.au  Fact Sheet for Healthcare  Providers: IncredibleEmployment.be  This test is not yet approved or cleared by the [place] FDA and has been authorized for detection and/or diagnosis of SARS-CoV-2 by FDA under an Emergency Use Authorization (EUA). This EUA will remain in effect (meaning this test can be used) for the duration of the COVID-19 declaration under Section 564(b)(1) of the Act, 21 U.S.C. section 360bbb-3(b)(1), unless the authorization is terminated or revoked.  Performed at [facility], [place] [address], [place], [place] [address]          Radiology Studies: No results found.      Scheduled Meds: .  HYDROmorphone (DILAUDID) injection  0.5 mg Intravenous BID   Continuous Infusions:   LOS: 1 day    Time spent: 25 minutes spent on chart review, discussion with nursing staff, consultants, updating family and interview/physical exam; more than 50% of that time was spent in counseling and/or coordination of care.    Barnell Shieh J [place] DO [facility] Available via Epic secure chat 7am-7pm After these hours, please refer to coverage provider listed on [email or web address] 09/21/2020, 10:08 AM

## 2020-09-22 NOTE — Progress Notes (Signed)
PROGRESS NOTE    Amber Lara  LPF:[id number] DOB: 02/15/1961 DOA: 09/18/2020 PCP: Patient, No Pcp Per    Brief Narrative:  Amber Lara is a 59 year old female with past medical history significant for ESRD on HD, essential hypertension, diabetes mellitus, frequent falls, schizophrenia, delusional disorder, paranoid disorder, early onset dementia, anemia of chronic kidney disease and morbid obesity who presented from [place] by nursing home physician for emergent dialysis. Patient has been declining for some time, and has not had a complete hemodialysis session in nearly 30 days with last HD 8 days prior. While being dialyzed, she tends to become acutely agitated and will pull out her line. Apparently, her regular doctor was concerned that hospice was not in the sort the best decision and sent her to the emergency department.  In the ED, temperature 97.9, HR 67, RR 16, BP 151/66, SPO2 100% on room air. WBC 8.4, hemoglobin 9.6, platelets 239. Sodium 137, potassium 4.7, chloride 100, CO2 21, BUN 102, creatinine 11.77, glucose 140. SARS-CoV-2/influenza A/B PCR negative. Patient's family, daughter believes that she will not be able to continue hemodialysis and request hospital admission under hospice care with comfort measures until residential hospice facility can accept. Hospitalist service consulted for further evaluation and management.   Assessment & Plan:   Principal Problem:   Comfort measures only status Active Problems:   ESRD (end stage renal disease) ([place])   Schizophrenia ([place])   Acute metabolic encephalopathy   ESRD on HD Psychiatric disorder Patient presenting from [place] by direction of nursing home provider for need of emergent dialysis. Patient has not had full dialysis session the past 30 days with last partial session 8 days prior. Patient's family requests transition to comfort measures as they believe she will not be able to continue hemodialysis in  the future given her progressive psychiatric history and noncompliance with HD sessions. Patient follows with hospice, [facility] outpatient. --On comfort measures, DNR --Haldol/lorazepam as needed agitation --Dilaudid as needed for pain --Pending residential hospice bed   DVT prophylaxis: None, on comfort measures Code Status: DNR Family Communication: No family present at bedside this morning  Disposition Plan:  Status is: Observation  The patient remains OBS appropriate and will d/c before 2 midnights.  Dispo: The patient is from: SNF              Anticipated d/c is to: Residential hospice              Anticipated d/c date is: 1 day              Patient currently is medically stable to d/c.    Consultants:   Hospice  Procedures:   None  Antimicrobials:   None   Subjective: Patient seen and examined bedside, resting comfortably in bed. Sleeping and unresponsive this morning. No family present at bedside. Awaiting residential hospice bed at [facility]. No acute concerns per nursing staff this morning  Objective: Vitals:   09/20/20 2100 09/21/20 0509 09/21/20 1210 09/22/20 0012  BP: (!) 162/70 (!) 160/74 (!) 161/78 (!) 186/76  Pulse: 74 70 75 77  Resp: 16 16 16 18   Temp:   97.7 F (36.5 C) 98.4 F (36.9 C)  TempSrc:   Oral Axillary  SpO2: 97% 96% 97% 98%  Weight:      Height:        Intake/Output Summary (Last 24 hours) at 09/22/2020 5329 Last data filed at 09/22/2020 0800 Gross per 24 hour  Intake 0 ml  Output --  Net 0 ml   Filed Weights   09/18/20 2012  Weight: 135 kg    Examination:  General exam: Appears calm and comfortable, unresponsive to verbal command Respiratory system: Clear to auscultation. Respiratory effort normal. On room air Cardiovascular system: S1 & S2 heard, RRR. No JVD, murmurs, rubs, gallops or clicks. No pedal edema. Gastrointestinal system: Abdomen is nondistended, soft and nontender. No organomegaly or masses felt.  Normal bowel sounds heard. Central nervous system: Unresponsive Skin: No rashes, lesions or ulcers    Data Reviewed: I have personally reviewed following labs and imaging studies  CBC: Recent Labs  Lab 09/18/20 2015  WBC 8.4  NEUTROABS 5.6  HGB 9.6*  HCT 29.8*  MCV 89.8  PLT 588   Basic Metabolic Panel: Recent Labs  Lab 09/18/20 2015  NA 137  K 4.7  CL 100  CO2 21*  GLUCOSE 140*  BUN 102*  CREATININE 11.77*  CALCIUM 9.1   GFR: Estimated Creatinine Clearance: 7 mL/min (A) (by C-G formula based on SCr of 11.77 mg/dL (H)). Liver Function Tests: Recent Labs  Lab 09/18/20 2015  AST 15  ALT 15  ALKPHOS 98  BILITOT 0.7  PROT 7.8  ALBUMIN 3.7   No results for input(s): LIPASE, AMYLASE in the last 168 hours. No results for input(s): AMMONIA in the last 168 hours. Coagulation Profile: No results for input(s): INR, PROTIME in the last 168 hours. Cardiac Enzymes: No results for input(s): CKTOTAL, CKMB, CKMBINDEX, TROPONINI in the last 168 hours. BNP (last 3 results) No results for input(s): PROBNP in the last 8760 hours. HbA1C: No results for input(s): HGBA1C in the last 72 hours. CBG: Recent Labs  Lab 09/19/20 0758  GLUCAP 116*   Lipid Profile: No results for input(s): CHOL, HDL, LDLCALC, TRIG, CHOLHDL, LDLDIRECT in the last 72 hours. Thyroid Function Tests: No results for input(s): TSH, T4TOTAL, FREET4, T3FREE, THYROIDAB in the last 72 hours. Anemia Panel: No results for input(s): VITAMINB12, FOLATE, FERRITIN, TIBC, IRON, RETICCTPCT in the last 72 hours. Sepsis Labs: No results for input(s): PROCALCITON, LATICACIDVEN in the last 168 hours.  Recent Results (from the past 240 hour(s))  Resp Panel by RT-PCR (Flu A&B, Covid) Nasopharyngeal Swab     Status: None   Collection Time: 09/19/20  8:46 AM   Specimen: Nasopharyngeal Swab; Nasopharyngeal(NP) swabs in vial transport medium  Result Value Ref Range Status   SARS Coronavirus 2 by RT PCR NEGATIVE  NEGATIVE Final    Comment: (NOTE) SARS-CoV-2 target nucleic acids are NOT DETECTED.  The SARS-CoV-2 RNA is generally detectable in upper respiratory specimens during the acute phase of infection. The lowest concentration of SARS-CoV-2 viral copies this assay can detect is 138 copies/mL. A negative result does not preclude SARS-Cov-2 infection and should not be used as the sole basis for treatment or other patient management decisions. A negative result may occur with  improper specimen collection/handling, submission of specimen other than nasopharyngeal swab, presence of viral mutation(s) within the areas targeted by this assay, and inadequate number of viral copies(<138 copies/mL). A negative result must be combined with clinical observations, patient history, and epidemiological information. The expected result is Negative.  Fact Sheet for Patients:  EntrepreneurPulse.com.au  Fact Sheet for Healthcare Providers:  IncredibleEmployment.be  This test is no t yet approved or cleared by the [place] FDA and  has been authorized for detection and/or diagnosis of SARS-CoV-2 by FDA under an Emergency Use Authorization (EUA). This EUA will remain  in effect (  meaning this test can be used) for the duration of the COVID-19 declaration under Section 564(b)(1) of the Act, 21 U.S.C.section 360bbb-3(b)(1), unless the authorization is terminated  or revoked sooner.       Influenza A by PCR NEGATIVE NEGATIVE Final   Influenza B by PCR NEGATIVE NEGATIVE Final    Comment: (NOTE) The Xpert Xpress SARS-CoV-2/FLU/RSV plus assay is intended as an aid in the diagnosis of influenza from Nasopharyngeal swab specimens and should not be used as a sole basis for treatment. Nasal washings and aspirates are unacceptable for Xpert Xpress SARS-CoV-2/FLU/RSV testing.  Fact Sheet for Patients: EntrepreneurPulse.com.au  Fact Sheet for Healthcare  Providers: IncredibleEmployment.be  This test is not yet approved or cleared by the [place] FDA and has been authorized for detection and/or diagnosis of SARS-CoV-2 by FDA under an Emergency Use Authorization (EUA). This EUA will remain in effect (meaning this test can be used) for the duration of the COVID-19 declaration under Section 564(b)(1) of the Act, 21 U.S.C. section 360bbb-3(b)(1), unless the authorization is terminated or revoked.  Performed at [facility], [place] [address], [place],  [address]          Radiology Studies: No results found.      Scheduled Meds: .  HYDROmorphone (DILAUDID) injection  0.5 mg Intravenous BID   Continuous Infusions:   LOS: 2 days    Time spent: 25 minutes spent on chart review, discussion with nursing staff, consultants, updating family and interview/physical exam; more than 50% of that time was spent in counseling and/or coordination of care.    Jennings Corado J [place] DO [facility] Available via Epic secure chat 7am-7pm After these hours, please refer to coverage provider listed on [email or web address] 09/22/2020, 9:52 AM

## 2020-09-22 NOTE — Progress Notes (Signed)
[place] 0N39 [facility]) Hospitalized Hospice Patient   Amber Lara is a current hospice patient, admitted to hospice services on 09/16/20 with a terminal diagnosis of ESRD. Staff at [facility] activated EMS on 09/18/20 when patient verbalized desire to continue with dialysis. Patient was admitted to [facility] on 09/19/20 with a diagnosis of ESRD for comfort care. Per Dr. Eulas Post, [facility] MD, this is a related hospital admission.   Visited patient at the bedside, no visitors present in the room. Pt alert and calm, endorses pain in her back. Respirations even and unlabored.  No acute distress noted.  RN Aldona Bar feeding pt bites of mashed potato and green beans, reports pt had 24oz juice today.  Plan to transfer to [facility] for end of life care once a bed is available.  V/S: 98.4 axillary, 186/76, HR 77, RR 18, SPO2 98% RA I&O, labs, diagnostics: full comfort measures, awaiting bed placement at [facility]. IV's/PRNs: dilaudid 0.5 mg IV x 2, haldol 0.5 mg IV x 2, ativan 0.5 mg IV x 2  Problem List: - ESRD - no further options for dialysis - metabolic encephalopathy - patient is alert and calm during this visit  [place]: clear, full comfort D/C planning: d/c to [facility], no bed is available today Family: will update IDT: hospice team updated  Thank you for the opportunity to participate in this patient's care.     Domenic Moras, BSN, RN [facility] Liaison   [phone]

## 2020-09-23 MED ORDER — HYDROMORPHONE HCL 1 MG/ML IJ SOLN
0.5000 mg | INTRAMUSCULAR | Status: DC | PRN
  Administered 2020-09-24: 0.5 mg via INTRAVENOUS
  Filled 2020-09-23: qty 1

## 2020-09-23 MED ORDER — LORAZEPAM 2 MG/ML IJ SOLN: 0.5000 mg | INTRAMUSCULAR | Status: DC | PRN

## 2020-09-23 NOTE — Progress Notes (Addendum)
PROGRESS NOTE    Amber Lara  KXF:[id number] DOB: Nov 13, 1960 DOA: 09/18/2020 PCP: Patient, No Pcp Per    Brief Narrative:  Amber Lara is a 59 year old female with past medical history significant for ESRD on HD, essential hypertension, diabetes mellitus, frequent falls, schizophrenia, delusional disorder, paranoid disorder, early onset dementia, anemia of chronic kidney disease and morbid obesity who presented from [place] by nursing home physician for emergent dialysis. Patient has been declining for some time, and has not had a complete hemodialysis session in nearly 30 days with last HD 8 days prior. While being dialyzed, she tends to become acutely agitated and will pull out her line. Patient is now comfort care.  Assessment & Plan:   Principal Problem:   Comfort measures only status Active Problems:   ESRD (end stage renal disease) ([place])   Schizophrenia ([place])   Acute metabolic encephalopathy   ESRD on HD Psychiatric disorder Patient presenting from [place] by direction of nursing home provider for need of emergent dialysis. Patient has not had full dialysis session the past 30 days with last partial session 8 days prior. Patient's family requests transition to comfort measures as they believe she will not be able to continue hemodialysis in the future given her progressive psychiatric history and noncompliance with HD sessions. Patient follows with hospice, [facility] outpatient. --On comfort measures, DNR --Haldol/lorazepam as needed agitation --Dilaudid as needed for pain --Pending residential hospice bed   DVT prophylaxis: None, on comfort measures Code Status: DNR Family Communication: daughter at bedside  Disposition Plan:  Status is: inpt   Dispo: The patient is from: SNF              Anticipated d/c is to: Residential hospice              Anticipated d/c date is: when bed available              Patient currently is medically stable to  d/c.    Consultants:   Hospice    Subjective: Appears agitated as she is moving all over bed but denies discomfort when asked  Objective: Vitals:   09/20/20 2100 09/21/20 0509 09/21/20 1210 09/22/20 0012  BP: (!) 162/70 (!) 160/74 (!) 161/78 (!) 186/76  Pulse: 74 70 75 77  Resp: 16 16 16 18   Temp:   97.7 F (36.5 C) 98.4 F (36.9 C)  TempSrc:   Oral Axillary  SpO2: 97% 96% 97% 98%  Weight:      Height:        Intake/Output Summary (Last 24 hours) at 09/23/2020 1139 Last data filed at 09/22/2020 1743 Gross per 24 hour  Intake 720 ml  Output --  Net 720 ml   Filed Weights   09/18/20 2012  Weight: 135 kg    Examination:   General: Appearance:    Severely obese female moving all over bed     Lungs:    respirations unlabored        Neurologic:   Awake, will answer simple questions      Data Reviewed: I have personally reviewed following labs and imaging studies  CBC: Recent Labs  Lab 09/18/20 2015  WBC 8.4  NEUTROABS 5.6  HGB 9.6*  HCT 29.8*  MCV 89.8  PLT 696   Basic Metabolic Panel: Recent Labs  Lab 09/18/20 2015  NA 137  K 4.7  CL 100  CO2 21*  GLUCOSE 140*  BUN 102*  CREATININE 11.77*  CALCIUM 9.1  GFR: Estimated Creatinine Clearance: 7 mL/min (A) (by C-G formula based on SCr of 11.77 mg/dL (H)). Liver Function Tests: Recent Labs  Lab 09/18/20 2015  AST 15  ALT 15  ALKPHOS 98  BILITOT 0.7  PROT 7.8  ALBUMIN 3.7   No results for input(s): LIPASE, AMYLASE in the last 168 hours. No results for input(s): AMMONIA in the last 168 hours. Coagulation Profile: No results for input(s): INR, PROTIME in the last 168 hours. Cardiac Enzymes: No results for input(s): CKTOTAL, CKMB, CKMBINDEX, TROPONINI in the last 168 hours. BNP (last 3 results) No results for input(s): PROBNP in the last 8760 hours. HbA1C: No results for input(s): HGBA1C in the last 72 hours. CBG: Recent Labs  Lab 09/19/20 0758  GLUCAP 116*   Lipid  Profile: No results for input(s): CHOL, HDL, LDLCALC, TRIG, CHOLHDL, LDLDIRECT in the last 72 hours. Thyroid Function Tests: No results for input(s): TSH, T4TOTAL, FREET4, T3FREE, THYROIDAB in the last 72 hours. Anemia Panel: No results for input(s): VITAMINB12, FOLATE, FERRITIN, TIBC, IRON, RETICCTPCT in the last 72 hours. Sepsis Labs: No results for input(s): PROCALCITON, LATICACIDVEN in the last 168 hours.  Recent Results (from the past 240 hour(s))  Resp Panel by RT-PCR (Flu A&B, Covid) Nasopharyngeal Swab     Status: None   Collection Time: 09/19/20  8:46 AM   Specimen: Nasopharyngeal Swab; Nasopharyngeal(NP) swabs in vial transport medium  Result Value Ref Range Status   SARS Coronavirus 2 by RT PCR NEGATIVE NEGATIVE Final    Comment: (NOTE) SARS-CoV-2 target nucleic acids are NOT DETECTED.  The SARS-CoV-2 RNA is generally detectable in upper respiratory specimens during the acute phase of infection. The lowest concentration of SARS-CoV-2 viral copies this assay can detect is 138 copies/mL. A negative result does not preclude SARS-Cov-2 infection and should not be used as the sole basis for treatment or other patient management decisions. A negative result may occur with  improper specimen collection/handling, submission of specimen other than nasopharyngeal swab, presence of viral mutation(s) within the areas targeted by this assay, and inadequate number of viral copies(<138 copies/mL). A negative result must be combined with clinical observations, patient history, and epidemiological information. The expected result is Negative.  Fact Sheet for Patients:  EntrepreneurPulse.com.au  Fact Sheet for Healthcare Providers:  IncredibleEmployment.be  This test is no t yet approved or cleared by the [place] FDA and  has been authorized for detection and/or diagnosis of SARS-CoV-2 by FDA under an Emergency Use Authorization (EUA). This EUA  will remain  in effect (meaning this test can be used) for the duration of the COVID-19 declaration under Section 564(b)(1) of the Act, 21 U.S.C.section 360bbb-3(b)(1), unless the authorization is terminated  or revoked sooner.       Influenza A by PCR NEGATIVE NEGATIVE Final   Influenza B by PCR NEGATIVE NEGATIVE Final    Comment: (NOTE) The Xpert Xpress SARS-CoV-2/FLU/RSV plus assay is intended as an aid in the diagnosis of influenza from Nasopharyngeal swab specimens and should not be used as a sole basis for treatment. Nasal washings and aspirates are unacceptable for Xpert Xpress SARS-CoV-2/FLU/RSV testing.  Fact Sheet for Patients: EntrepreneurPulse.com.au  Fact Sheet for Healthcare Providers: IncredibleEmployment.be  This test is not yet approved or cleared by the [place] FDA and has been authorized for detection and/or diagnosis of SARS-CoV-2 by FDA under an Emergency Use Authorization (EUA). This EUA will remain in effect (meaning this test can be used) for the duration of the COVID-19 declaration under  Section 564(b)(1) of the Act, 21 U.S.C. section 360bbb-3(b)(1), unless the authorization is terminated or revoked.  Performed at [facility], [place] [address] [place], [place] [address]          Radiology Studies: No results found.      Scheduled Meds: .  HYDROmorphone (DILAUDID) injection  0.5 mg Intravenous BID   Continuous Infusions:   LOS: 3 days    Time spent: 25 minutes spent on chart review, discussion with nursing staff, consultants, updating family and interview/physical exam; more than 50% of that time was spent in counseling and/or coordination of care.    Geradine Girt, DO [facility]  09/23/2020, 11:39 AM

## 2020-09-23 NOTE — Progress Notes (Signed)
[place] 9G28 [facility]) Hospitalized Hospice Patient   Amber Lara is a current hospice patient, admitted to hospice services on 09/16/20 with a terminal diagnosis of ESRD. Staff at [facility] activated EMS on 09/18/20 when patient verbalized desire to continue with dialysis. Patient was admitted to [facility] on 09/19/20 with a diagnosis of ESRD for comfort care. Per Dr. Eulas Post, [facility] MD, this is a related hospital admission.   Visited patient and daughter Amber Lara at the bedside.  Bedside RN Amber Lara present for part of visit, providing pt care.  Pt alert to self and daughter, calm and cooperative.  Amber Lara reports her mom complained of new shortness of breath (SOB) today.  Amber Lara shared how hard that was to see and hear. Patient is now also seeing her deceased husband and reaching out for his hand. Normalized this for Amber Lara as a common experience for people in their last week or two. Discussed expected signs and sympoms patient may experience in her last days.  Amber Lara vebalized understanding.  Dr. Eliseo Squires, attending DO, made aware of SOB.  indications for use of PRN dilaudid expanded to include SOB or agitation/distress.  No bed available at [facility] today; Bed offered today at [facility] in [place], but family declined the bed due to distance and transportation challenges.    V/S: no new VS today (98.4 axillary, 186/76, HR 77, RR 18, SPO2 98% RA on 09/22/20) I&O, labs, diagnostics: full comfort measures, awaiting bed placement at [facility]. IV's/PRNs: dilaudid 0.5 mg IV x 3, haldol 0.5 mg IV x 0, ativan 0.5 mg IV x 0  Problem List: - ESRD - no further options for dialysis - metabolic encephalopathy - patient is alert and calm during this visit  [place]: clear, full comfort D/C planning: d/c to [facility], no bed is available today Family: updated at bedside and with folow up phone call IDT: hospice team updated  Domenic Moras, BSN, RN [facility] Liaison [phone] (24h on call)

## 2020-09-24 MED ORDER — HYDROMORPHONE HCL 1 MG/ML IJ SOLN
0.5000 mg | Freq: Three times a day (TID) | INTRAMUSCULAR | Status: DC
  Administered 2020-09-24 – 2020-09-27 (×10): 0.5 mg via INTRAVENOUS
  Filled 2020-09-24 (×10): qty 1

## 2020-09-24 MED ORDER — CAMPHOR-MENTHOL 0.5-0.5 % EX LOTN
TOPICAL_LOTION | CUTANEOUS | Status: DC | PRN
  Filled 2020-09-24: qty 222

## 2020-09-24 NOTE — Progress Notes (Signed)
PROGRESS NOTE    Amber Lara  XQJ:[id number] DOB: 1961/03/14 DOA: 09/18/2020 PCP: Patient, No Pcp Per    Brief Narrative:  Amber Lara is a 59 year old female with past medical history significant for ESRD on HD, essential hypertension, diabetes mellitus, frequent falls, schizophrenia, delusional disorder, paranoid disorder, early onset dementia, anemia of chronic kidney disease and morbid obesity who presented from [place] by nursing home physician for emergent dialysis. Patient has been declining for some time, and has not had a complete hemodialysis session in nearly 30 days with last HD 8 days prior. While being dialyzed, she tends to become acutely agitated and will pull out her line. Patient is now comfort care.  Assessment & Plan:   Principal Problem:   Comfort measures only status Active Problems:   ESRD (end stage renal disease) ([place])   Schizophrenia ([place])   Acute metabolic encephalopathy   ESRD on HD Psychiatric disorder Patient presenting from [place] by direction of nursing home provider for need of emergent dialysis. Patient has not had full dialysis session the past 30 days with last partial session 8 days prior. Patient's family requests transition to comfort measures as they believe she will not be able to continue hemodialysis in the future given her progressive psychiatric history and noncompliance with HD sessions. Patient follows with hospice, [facility] outpatient. --On comfort measures, DNR --Haldol/lorazepam as needed agitation --Dilaudid as needed for pain --Pending residential hospice bed   DVT prophylaxis: None, on comfort measures Code Status: DNR Family Communication: daughter at bedside 12/15  Disposition Plan:  Status is: inpt   Dispo: The patient is from: SNF              Anticipated d/c is to: Residential hospice              Anticipated d/c date is: when bed available              Patient currently is medically stable  to d/c.    Consultants:   Hospice    Subjective:  C/o leg aching  Objective: Vitals:   09/20/20 2100 09/21/20 0509 09/21/20 1210 09/22/20 0012  BP: (!) 162/70 (!) 160/74 (!) 161/78 (!) 186/76  Pulse: 74 70 75 77  Resp: 16 16 16 18   Temp:   97.7 F (36.5 C) 98.4 F (36.9 C)  TempSrc:   Oral Axillary  SpO2: 97% 96% 97% 98%  Weight:      Height:        Intake/Output Summary (Last 24 hours) at 09/24/2020 4481 Last data filed at 09/24/2020 0700 Gross per 24 hour  Intake 360 ml  Output --  Net 360 ml   Filed Weights   09/18/20 2012  Weight: 135 kg    Examination:   General: Appearance:    Severely obese female with legs over railing on bed     Lungs:     No wheezing, respirations unlabored  Heart:    Normal heart rate. Normal rhythm. No murmurs, rubs, or gallops.   MS:   All extremities are intact. Skin dry  Neurologic:   Awake, will answer simple questions but not always appropriately        Data Reviewed: I have personally reviewed following labs and imaging studies  CBC: Recent Labs  Lab 09/18/20 2015  WBC 8.4  NEUTROABS 5.6  HGB 9.6*  HCT 29.8*  MCV 89.8  PLT 856   Basic Metabolic Panel: Recent Labs  Lab 09/18/20 2015  NA 137  K 4.7  CL 100  CO2 21*  GLUCOSE 140*  BUN 102*  CREATININE 11.77*  CALCIUM 9.1   GFR: Estimated Creatinine Clearance: 7 mL/min (A) (by C-G formula based on SCr of 11.77 mg/dL (H)). Liver Function Tests: Recent Labs  Lab 09/18/20 2015  AST 15  ALT 15  ALKPHOS 98  BILITOT 0.7  PROT 7.8  ALBUMIN 3.7   No results for input(s): LIPASE, AMYLASE in the last 168 hours. No results for input(s): AMMONIA in the last 168 hours. Coagulation Profile: No results for input(s): INR, PROTIME in the last 168 hours. Cardiac Enzymes: No results for input(s): CKTOTAL, CKMB, CKMBINDEX, TROPONINI in the last 168 hours. BNP (last 3 results) No results for input(s): PROBNP in the last 8760 hours. HbA1C: No results for  input(s): HGBA1C in the last 72 hours. CBG: Recent Labs  Lab 09/19/20 0758  GLUCAP 116*   Lipid Profile: No results for input(s): CHOL, HDL, LDLCALC, TRIG, CHOLHDL, LDLDIRECT in the last 72 hours. Thyroid Function Tests: No results for input(s): TSH, T4TOTAL, FREET4, T3FREE, THYROIDAB in the last 72 hours. Anemia Panel: No results for input(s): VITAMINB12, FOLATE, FERRITIN, TIBC, IRON, RETICCTPCT in the last 72 hours. Sepsis Labs: No results for input(s): PROCALCITON, LATICACIDVEN in the last 168 hours.  Recent Results (from the past 240 hour(s))  Resp Panel by RT-PCR (Flu A&B, Covid) Nasopharyngeal Swab     Status: None   Collection Time: 09/19/20  8:46 AM   Specimen: Nasopharyngeal Swab; Nasopharyngeal(NP) swabs in vial transport medium  Result Value Ref Range Status   SARS Coronavirus 2 by RT PCR NEGATIVE NEGATIVE Final    Comment: (NOTE) SARS-CoV-2 target nucleic acids are NOT DETECTED.  The SARS-CoV-2 RNA is generally detectable in upper respiratory specimens during the acute phase of infection. The lowest concentration of SARS-CoV-2 viral copies this assay can detect is 138 copies/mL. A negative result does not preclude SARS-Cov-2 infection and should not be used as the sole basis for treatment or other patient management decisions. A negative result may occur with  improper specimen collection/handling, submission of specimen other than nasopharyngeal swab, presence of viral mutation(s) within the areas targeted by this assay, and inadequate number of viral copies(<138 copies/mL). A negative result must be combined with clinical observations, patient history, and epidemiological information. The expected result is Negative.  Fact Sheet for Patients:  EntrepreneurPulse.com.au  Fact Sheet for Healthcare Providers:  IncredibleEmployment.be  This test is no t yet approved or cleared by the [place] FDA and  has been authorized  for detection and/or diagnosis of SARS-CoV-2 by FDA under an Emergency Use Authorization (EUA). This EUA will remain  in effect (meaning this test can be used) for the duration of the COVID-19 declaration under Section 564(b)(1) of the Act, 21 U.S.C.section 360bbb-3(b)(1), unless the authorization is terminated  or revoked sooner.       Influenza A by PCR NEGATIVE NEGATIVE Final   Influenza B by PCR NEGATIVE NEGATIVE Final    Comment: (NOTE) The Xpert Xpress SARS-CoV-2/FLU/RSV plus assay is intended as an aid in the diagnosis of influenza from Nasopharyngeal swab specimens and should not be used as a sole basis for treatment. Nasal washings and aspirates are unacceptable for Xpert Xpress SARS-CoV-2/FLU/RSV testing.  Fact Sheet for Patients: EntrepreneurPulse.com.au  Fact Sheet for Healthcare Providers: IncredibleEmployment.be  This test is not yet approved or cleared by the [place] FDA and has been authorized for detection and/or diagnosis of SARS-CoV-2 by FDA under an Emergency Use  Authorization (EUA). This EUA will remain in effect (meaning this test can be used) for the duration of the COVID-19 declaration under Section 564(b)(1) of the Act, 21 U.S.C. section 360bbb-3(b)(1), unless the authorization is terminated or revoked.  Performed at [facility], [place] [address], [place], [place] [address]          Radiology Studies: No results found.      Scheduled Meds: .  HYDROmorphone (DILAUDID) injection  0.5 mg Intravenous Q8H   Continuous Infusions:   LOS: 4 days      Geradine Girt, DO [facility]  09/24/2020, 8:12 AM

## 2020-09-24 NOTE — Care Management Important Message (Signed)
Important Message  Patient Details  Name: Amber Lara MRN: [id number] Date of Birth: May 05, 1961   Medicare Important Message Given:  Yes     [name] 09/24/2020, 10:59 AM

## 2020-09-24 NOTE — Evaluation (Signed)
Clinical/Bedside Swallow Evaluation Patient Details  Name: Amber Lara MRN: [id number] Date of Birth: 1961/03/26  Today's Date: 09/24/2020 Time: SLP Start Time (ACUTE ONLY): 1000 SLP Stop Time (ACUTE ONLY): 1034 SLP Time Calculation (min) (ACUTE ONLY): 34 min  Past Medical History:  Past Medical History:  Diagnosis Date  . Arthritis   . Chronic anemia   . CKD (chronic kidney disease), stage V ([place])   . Diabetes mellitus ([place])   . History of stroke   . Hypertension   . Obesity   . Psychosis ([place])    a. prior adm in [place] ([place]) in 2015 where pt was admitted with hallucinations, seeing men in black outfits, family members on a non-existent camera, hearing voices and seeing animals - UDS positive for methamphetamines at that time and patient stated she believed someone had put this in her body.  . Seizures ([place])    Past Surgical History:  Past Surgical History:  Procedure Laterality Date  . ABDOMINAL HYSTERECTOMY    . AV FISTULA PLACEMENT Right 06/07/2018   Procedure: CREATION OF RIGHT BRACHIOCEPHALIC ARTERIOVENOUS FISTULA;  Surgeon: Serafina Mitchell, MD;  Location: [facility];  Service: Vascular;  Laterality: Right;  . IR DIALY SHUNT INTRO [place] W/IMG RIGHT Right 10/09/2018  . IR US GUIDE VASC ACCESS RIGHT  10/09/2018   HPI:  Pt is a 59 yo female admitted from SNF for emergent dialysis. Family had opted for comfort care though and had stopped dialysis as they believe she will not be able to continue with HD given psych hx and noncompliance (pt had not had a complete HD session in nearly 30 days). PMH includes: frequent falls, schizophrenia, seizures, psychosis, HTN, CVA, DM, CKD, chronic anemia, arthritis   Assessment / Plan / Recommendation Clinical Impression  Pt opens her eyes but has limited interaction with POs offered. Per RN, this could in part be effects of morning medications, as pt was more active and restless prior to administration. Pt  took in very little POs from the thin liquids and purees offered. Oral holding is noted, but there is no overt coughing once she does swallow given cues from SLP. Brief, mild wheezing was observed after thin liquids.   Discussed current presentation and plan with pt's daughter, Trevor Mace, as well as her mother-in-law, who were present for the evaluation. Trevor Mace believes that her mother's swallowing has declined over the last several days since dialysis had been stopped, and has noted only a few items that she has been able to get her to consume without overt coughing. This primarily consists of pudding and juices, so she would like for her to stay on this full liquid diet.   SLP shared that it is difficult to know what is "safe" when intake is so limited today, but that if the pt is coughing a lot with these consistencies, to take a break. Education was also provided about strategies for careful hand feeding, positioning, and routine oral care. SLP reinforced that pt's status may continue to change in this end of life stage, and that her risk for aspiration is likely to be present and may even fluctuate or increase, but that strategies could help reduce the risk as much as possible. We also discussed not putting pressure on eating/drinking enough to meet nutritional needs, but that with comfort feeds, the focus can be on comfort and providing POs only when awake, asking for them, and initiating swallowing. Trevor Mace verbalized her understanding of the information presented and is in  agreement with continuing full liquids as comfort feeds with ongoing SLP support.   SLP Visit Diagnosis: Dysphagia, unspecified (R13.10)    Aspiration Risk  Moderate aspiration risk;Risk for inadequate nutrition/hydration    Diet Recommendation Thin liquid;Other (Comment) (full liquid diet)   Liquid Administration via: Cup;Straw;Spoon Medication Administration: Crushed with puree (may need to consider alternative means if not  consistently able to take PO) Supervision: Staff to assist with self feeding;Full supervision/cueing for compensatory strategies Compensations: Minimize environmental distractions;Slow rate;Small sips/bites Postural Changes: Seated upright at 90 degrees    Other  Recommendations Oral Care Recommendations: Oral care QID Other Recommendations: Have oral suction available   Follow up Recommendations Other (comment) (plan is to d/c to hospice)      Frequency and Duration min 2x/week  2 weeks       Prognosis Prognosis for Safe Diet Advancement: Guarded Barriers to Reach Goals: Other (Comment) (overall medical prognosis)      Swallow Study   General HPI: Pt is a 59 yo female admitted from SNF for emergent dialysis. Family had opted for comfort care though and had stopped dialysis as they believe she will not be able to continue with HD given psych hx and noncompliance (pt had not had a complete HD session in nearly 30 days). PMH includes: frequent falls, schizophrenia, seizures, psychosis, HTN, CVA, DM, CKD, chronic anemia, arthritis Type of Study: Bedside Swallow Evaluation Previous Swallow Assessment: none in chart Diet Prior to this Study: Thin liquids (full liquids) Temperature Spikes Noted: No Respiratory Status: Room air History of Recent Intubation: No Behavior/Cognition: Alert;Requires cueing Oral Cavity Assessment:  (limited mouth opening therefore with limited visibility) Oral Care Completed by SLP: No Oral Cavity - Dentition:  (limited visibility but dtr reports that her teeth are dentures) Self-Feeding Abilities: Total assist Patient Positioning: Upright in bed (with use of reverse trendelenburg for optimal alertness) Baseline Vocal Quality: Normal Volitional Cough:  (did not test - not following commands) Volitional Swallow: Unable to elicit    Oral/Motor/Sensory Function Overall Oral Motor/Sensory Function:  (not following commands for assessment)   Ice Chips Ice  chips: Not tested   Thin Liquid Thin Liquid: Impaired Presentation: Straw Oral Phase Impairments: Poor awareness of bolus Pharyngeal  Phase Impairments:  (mild wheezing noted after sip)    Nectar Thick Nectar Thick Liquid: Not tested   Honey Thick Honey Thick Liquid: Not tested   Puree Puree: Impaired Presentation: Spoon Oral Phase Impairments: Poor awareness of bolus Oral Phase Functional Implications: Oral holding   Solid     Solid: Not tested      Osie Bond., M.A. [place] Pager [phone] Office [phone]  09/24/2020,12:52 PM

## 2020-09-24 NOTE — Progress Notes (Signed)
[place] 9V94 [facility]) Hospitalized Hospice Patient   Amber Lara is a current hospice patient, admitted to hospice services on 09/16/20 with a terminal diagnosis of ESRD. Staff at [facility] activated EMS on 09/18/20 when patient verbalized desire to continue with dialysis. Patient was admitted to [facility] on 09/19/20 with a diagnosis of ESRD for comfort care. Per Dr. Eulas Post, [facility] MD, this is a related hospital admission.   Visited patient at bedside, no family present at visit. Spoke with daughter earlier in the day to offer [facility] bed but she declined due to location. Patient awake with eyes open but would not respond to my voice or make eye contact. She did not appear in distress. It is reported that she is eating with assistance, but likely aspirating and she will not eat unassisted. She was evaluated by speech therapy today and the family was encouraged to do comfort feeds only and provided education.   VS:  Not documented today I/O:  360/ OP not documented  IV and PRN Medications: Dilaudid 0.5mg  IV Q8hrs scheduled, Haldol 0.5mg  IV PRN @ 0849  No new labs or Epic Imaging.   Problem list:    ESRD (end stage renal disease) ([place])   Schizophrenia ([place])   Acute metabolic encephalopathy  GOC: clear, full comfort D/C planning: d/c to [facility], no bed is available today. Offered HH bed but family declined.  Family: updated by phone IDT: hospice team updated  If patient needs ambulance transport please use [facility] as they contract this service for all of our active hospice patients.   Farrel Gordon, RN, [facility] (listed on [facility] under [place])   [phone]

## 2020-09-25 NOTE — Progress Notes (Signed)
[place] 1U40 [facility]) Hospitalized Hospice Patient   Amber Lara is a current hospice patient, admitted to hospice services on 09/16/20 with a terminal diagnosis of ESRD. Staff at [facility] activated EMS on 09/18/20 when patient verbalized desire to continue with dialysis. Patient was admitted to [facility] on 09/19/20 with a diagnosis of ESRD for comfort care. Per Dr. Eulas Post, [facility] MD, this is a related hospital admission.   Visited patient at bedside, no family present. Patient did not respond to voice and appeared to be sleeping. Appears comfortable. There continues to be [facility] open beds at [facility].  The family has refused our hospice facility in [place] due to transportation concerns. They family has also adamantly refused that she returns to [facility]. Attempted to speak with family via phone, but no answer and voicemail is full.  V/S: none taken at the request of the family, full comfort measures I&O, labs, diagnostics: none, full comfort measures IVs/PRNs: dilaudid 0.5 mg IV x 3, haldol 0.5 mg IV x 1   Problem List: - ESRD - last dialysis 15 days ago, full comfort - acute metabolic encephalopathy - baseline is not clear  GOC: clear, full comfort measures D/C planning: family would like [facility], however there is not an open bed for the foreseeable future. We have offered a bed at our facility in [place], family has declined due to lack of transportation.  IDT: hospice team updated Family: not answering calls today, no option to leave message  Venia Carbon RN, BSN, [facility] Liaison

## 2020-09-25 NOTE — Progress Notes (Signed)
SLP Cancellation Note  Patient Details Name: Amber Lara MRN: [id number] DOB: Feb 25, 1961   Cancelled treatment:       Reason Eval/Treat Not Completed: Fatigue/lethargy limiting ability to participate. SLP came to check on pt for f/u after yesterday's evaluation. She is not very interactive with me at the moment. She did say "yes" when asked if she wanted juice or pudding, but when anything is presented, she shows no awareness. No boluses administered in light of this. Her daughter is also not present at the moment. Yesterday, she had said that she is here most days until shortly after 13:00, but I must have missed her today. Will continue to follow and support this pt and her family as able.   Osie Bond., M.A. [facility] Pager [phone] Office [phone]  09/25/2020, 12:06 PM

## 2020-09-25 NOTE — TOC Progression Note (Signed)
Transition of Care [facility]) - Progression Note    Patient Details  Name: Amber Lara MRN: [id number] Date of Birth: 1961/04/15  Transition of Care [facility]) CM/SW Contact  Jacalyn Lefevre Edson Snowball, RN Phone Number: 09/25/2020, 4:09 PM  Clinical Narrative:    Damaris Schooner to [facility] with [facility] has no beds available currently.     Spoke to patient's daughter Zada Girt [phone].  Explained [facility] has no beds available. Asked if she would be interested in [facility] .  Patient's daughter Zada Girt [phone], stated that family would not be able to visit her mother if she went to [facility].   Checked with [facility], currently they do not have any beds available     Expected Discharge Plan: [place]    Expected Discharge Plan and Services Expected Discharge Plan: [place]   Discharge Planning Services: CM Consult Post Acute Care Choice: Hospice                             HH Arranged: NA           Social Determinants of Health (SDOH) Interventions    Readmission Risk Interventions No flowsheet data found.

## 2020-09-25 NOTE — Progress Notes (Signed)
PROGRESS NOTE    Amber Lara  JYN:[id number] DOB: Nov 08, 1960 DOA: 09/18/2020 PCP: Patient, No Pcp Per    Brief Narrative:  Amber Lara is a 59 year old female with past medical history significant for ESRD on HD, essential hypertension, diabetes mellitus, frequent falls, schizophrenia, delusional disorder, paranoid disorder, early onset dementia, anemia of chronic kidney disease and morbid obesity who presented from [place] by nursing home physician for emergent dialysis. Patient has been declining for some time, and has not had a complete hemodialysis session in nearly 30 days with last HD 8 days prior. While being dialyzed, she tends to become acutely agitated and will pull out her line. Patient is now comfort care.   Assessment & Plan:   Principal Problem:   Comfort measures only status Active Problems:   ESRD (end stage renal disease) ([place])   Schizophrenia ([place])   Acute metabolic encephalopathy   ESRD on HD Psychiatric disorder Patient presenting from [place] by direction of nursing home provider for need of emergent dialysis. Patient has not had full dialysis session the past 30 days with last partial session 8 days prior. Patient's family requests transition to comfort measures as they believe she will not be able to continue hemodialysis in the future given her progressive psychiatric history and noncompliance with HD sessions. Patient follows with hospice, [facility] outpatient. --On comfort measures, DNR --Haldol/lorazepam as needed agitation --Dilaudid as needed for pain --Pending residential hospice bed   DVT prophylaxis: None, on comfort measures Code Status: DNR Family Communication: daughter at bedside 12/15  Disposition Plan:  Status is: inpt   Dispo: The patient is from: SNF              Anticipated d/c is to: Residential hospice              Anticipated d/c date is: when bed available              Patient currently is medically  stable to d/c.    Consultants:   Hospice    Subjective: Minimally responsive today  Objective: Vitals:   09/20/20 2100 09/21/20 0509 09/21/20 1210 09/22/20 0012  BP: (!) 162/70 (!) 160/74 (!) 161/78 (!) 186/76  Pulse: 74 70 75 77  Resp: 16 16 16 18   Temp:   97.7 F (36.5 C) 98.4 F (36.9 C)  TempSrc:   Oral Axillary  SpO2: 97% 96% 97% 98%  Weight:      Height:        Intake/Output Summary (Last 24 hours) at 09/25/2020 0835 Last data filed at 09/24/2020 1700 Gross per 24 hour  Intake 120 ml  Output --  Net 120 ml   Filed Weights   09/18/20 2012  Weight: 135 kg    Examination:   Patient in bed, minimally responsive today Appears comfortable    Data Reviewed: I have personally reviewed following labs and imaging studies  CBC: Recent Labs  Lab 09/18/20 2015  WBC 8.4  NEUTROABS 5.6  HGB 9.6*  HCT 29.8*  MCV 89.8  PLT 865   Basic Metabolic Panel: Recent Labs  Lab 09/18/20 2015  NA 137  K 4.7  CL 100  CO2 21*  GLUCOSE 140*  BUN 102*  CREATININE 11.77*  CALCIUM 9.1   GFR: Estimated Creatinine Clearance: 7 mL/min (A) (by C-G formula based on SCr of 11.77 mg/dL (H)). Liver Function Tests: Recent Labs  Lab 09/18/20 2015  AST 15  ALT 15  ALKPHOS 98  BILITOT 0.7  PROT 7.8  ALBUMIN 3.7   No results for input(s): LIPASE, AMYLASE in the last 168 hours. No results for input(s): AMMONIA in the last 168 hours. Coagulation Profile: No results for input(s): INR, PROTIME in the last 168 hours. Cardiac Enzymes: No results for input(s): CKTOTAL, CKMB, CKMBINDEX, TROPONINI in the last 168 hours. BNP (last 3 results) No results for input(s): PROBNP in the last 8760 hours. HbA1C: No results for input(s): HGBA1C in the last 72 hours. CBG: Recent Labs  Lab 09/19/20 0758  GLUCAP 116*   Lipid Profile: No results for input(s): CHOL, HDL, LDLCALC, TRIG, CHOLHDL, LDLDIRECT in the last 72 hours. Thyroid Function Tests: No results for input(s):  TSH, T4TOTAL, FREET4, T3FREE, THYROIDAB in the last 72 hours. Anemia Panel: No results for input(s): VITAMINB12, FOLATE, FERRITIN, TIBC, IRON, RETICCTPCT in the last 72 hours. Sepsis Labs: No results for input(s): PROCALCITON, LATICACIDVEN in the last 168 hours.  Recent Results (from the past 240 hour(s))  Resp Panel by RT-PCR (Flu A&B, Covid) Nasopharyngeal Swab     Status: None   Collection Time: 09/19/20  8:46 AM   Specimen: Nasopharyngeal Swab; Nasopharyngeal(NP) swabs in vial transport medium  Result Value Ref Range Status   SARS Coronavirus 2 by RT PCR NEGATIVE NEGATIVE Final    Comment: (NOTE) SARS-CoV-2 target nucleic acids are NOT DETECTED.  The SARS-CoV-2 RNA is generally detectable in upper respiratory specimens during the acute phase of infection. The lowest concentration of SARS-CoV-2 viral copies this assay can detect is 138 copies/mL. A negative result does not preclude SARS-Cov-2 infection and should not be used as the sole basis for treatment or other patient management decisions. A negative result may occur with  improper specimen collection/handling, submission of specimen other than nasopharyngeal swab, presence of viral mutation(s) within the areas targeted by this assay, and inadequate number of viral copies(<138 copies/mL). A negative result must be combined with clinical observations, patient history, and epidemiological information. The expected result is Negative.  Fact Sheet for Patients:  EntrepreneurPulse.com.au  Fact Sheet for Healthcare Providers:  IncredibleEmployment.be  This test is no t yet approved or cleared by the [place] FDA and  has been authorized for detection and/or diagnosis of SARS-CoV-2 by FDA under an Emergency Use Authorization (EUA). This EUA will remain  in effect (meaning this test can be used) for the duration of the COVID-19 declaration under Section 564(b)(1) of the Act,  21 U.S.C.section 360bbb-3(b)(1), unless the authorization is terminated  or revoked sooner.       Influenza A by PCR NEGATIVE NEGATIVE Final   Influenza B by PCR NEGATIVE NEGATIVE Final    Comment: (NOTE) The Xpert Xpress SARS-CoV-2/FLU/RSV plus assay is intended as an aid in the diagnosis of influenza from Nasopharyngeal swab specimens and should not be used as a sole basis for treatment. Nasal washings and aspirates are unacceptable for Xpert Xpress SARS-CoV-2/FLU/RSV testing.  Fact Sheet for Patients: EntrepreneurPulse.com.au  Fact Sheet for Healthcare Providers: IncredibleEmployment.be  This test is not yet approved or cleared by the [place] FDA and has been authorized for detection and/or diagnosis of SARS-CoV-2 by FDA under an Emergency Use Authorization (EUA). This EUA will remain in effect (meaning this test can be used) for the duration of the COVID-19 declaration under Section 564(b)(1) of the Act, 21 U.S.C. section 360bbb-3(b)(1), unless the authorization is terminated or revoked.  Performed at [facility], [place] [address] [place], [place] [address]  Radiology Studies: No results found.      Scheduled Meds: .  HYDROmorphone (DILAUDID) injection  0.5 mg Intravenous Q8H   Continuous Infusions:   LOS: 5 days      Geradine Girt, DO [facility]  09/25/2020, 8:35 AM

## 2020-09-26 NOTE — Progress Notes (Signed)
PROGRESS NOTE    Amber Lara  OZD:[id number] DOB: 05-22-1961 DOA: 09/18/2020 PCP: Patient, No Pcp Per    Brief Narrative:  Amber Lara is a 59 year old female with past medical history significant for ESRD on HD, essential hypertension, diabetes mellitus, frequent falls, schizophrenia, delusional disorder, paranoid disorder, early onset dementia, anemia of chronic kidney disease and morbid obesity who presented from [place] by nursing home physician for emergent dialysis. Patient has been declining for some time, and has not had a complete hemodialysis session in nearly 30 days with last HD 8 days prior. While being dialyzed, she tends to become acutely agitated and will pull out her line. Patient is now comfort care.   Assessment & Plan:   Principal Problem:   Comfort measures only status Active Problems:   ESRD (end stage renal disease) ([place])   Schizophrenia ([place])   Acute metabolic encephalopathy   ESRD on HD Psychiatric disorder Patient presenting from [place] by direction of nursing home provider for need of emergent dialysis. Patient has not had full dialysis session the past 30 days with last partial session 8 days prior. Patient's family requests transition to comfort measures as they believe she will not be able to continue hemodialysis in the future given her progressive psychiatric history and noncompliance with HD sessions. Patient follows with hospice, [facility] outpatient. --On comfort measures, DNR --Haldol/lorazepam as needed agitation --Dilaudid as needed for pain --Pending residential hospice bed   DVT prophylaxis: None, on comfort measures Code Status: DNR Family Communication: daughter at bedside 12/15  Disposition Plan:  Status is: inpt   Dispo: The patient is from: SNF              Anticipated d/c is to: Residential hospice              Anticipated d/c date is: when bed available              Patient currently is medically  stable to d/c.    Consultants:   Hospice    Subjective: Sleeping- appears comfortable  Objective: Vitals:   09/21/20 0509 09/21/20 1210 09/22/20 0012 09/25/20 1319  BP: (!) 160/74 (!) 161/78 (!) 186/76   Pulse: 70 75 77   Resp: 16 16 18    Temp:  97.7 F (36.5 C) 98.4 F (36.9 C) 99.9 F (37.7 C)  TempSrc:  Oral Axillary Axillary  SpO2: 96% 97% 98%   Weight:      Height:        Intake/Output Summary (Last 24 hours) at 09/26/2020 1045 Last data filed at 09/26/2020 0800 Gross per 24 hour  Intake 0 ml  Output --  Net 0 ml   Filed Weights   09/18/20 2012  Weight: 135 kg    Examination:  In bed, appears comfortable Sleeping with eyes closed Did not wake up to voice or touch    Data Reviewed: I have personally reviewed following labs and imaging studies  CBC: No results for input(s): WBC, NEUTROABS, HGB, HCT, MCV, PLT in the last 168 hours. Basic Metabolic Panel: No results for input(s): NA, K, CL, CO2, GLUCOSE, BUN, CREATININE, CALCIUM, MG, PHOS in the last 168 hours. GFR: Estimated Creatinine Clearance: 7 mL/min (A) (by C-G formula based on SCr of 11.77 mg/dL (H)). Liver Function Tests: No results for input(s): AST, ALT, ALKPHOS, BILITOT, PROT, ALBUMIN in the last 168 hours. No results for input(s): LIPASE, AMYLASE in the last 168 hours. No results for input(s): AMMONIA in the last  168 hours. Coagulation Profile: No results for input(s): INR, PROTIME in the last 168 hours. Cardiac Enzymes: No results for input(s): CKTOTAL, CKMB, CKMBINDEX, TROPONINI in the last 168 hours. BNP (last 3 results) No results for input(s): PROBNP in the last 8760 hours. HbA1C: No results for input(s): HGBA1C in the last 72 hours. CBG: No results for input(s): GLUCAP in the last 168 hours. Lipid Profile: No results for input(s): CHOL, HDL, LDLCALC, TRIG, CHOLHDL, LDLDIRECT in the last 72 hours. Thyroid Function Tests: No results for input(s): TSH, T4TOTAL, FREET4, T3FREE,  THYROIDAB in the last 72 hours. Anemia Panel: No results for input(s): VITAMINB12, FOLATE, FERRITIN, TIBC, IRON, RETICCTPCT in the last 72 hours. Sepsis Labs: No results for input(s): PROCALCITON, LATICACIDVEN in the last 168 hours.  Recent Results (from the past 240 hour(s))  Resp Panel by RT-PCR (Flu A&B, Covid) Nasopharyngeal Swab     Status: None   Collection Time: 09/19/20  8:46 AM   Specimen: Nasopharyngeal Swab; Nasopharyngeal(NP) swabs in vial transport medium  Result Value Ref Range Status   SARS Coronavirus 2 by RT PCR NEGATIVE NEGATIVE Final    Comment: (NOTE) SARS-CoV-2 target nucleic acids are NOT DETECTED.  The SARS-CoV-2 RNA is generally detectable in upper respiratory specimens during the acute phase of infection. The lowest concentration of SARS-CoV-2 viral copies this assay can detect is 138 copies/mL. A negative result does not preclude SARS-Cov-2 infection and should not be used as the sole basis for treatment or other patient management decisions. A negative result may occur with  improper specimen collection/handling, submission of specimen other than nasopharyngeal swab, presence of viral mutation(s) within the areas targeted by this assay, and inadequate number of viral copies(<138 copies/mL). A negative result must be combined with clinical observations, patient history, and epidemiological information. The expected result is Negative.  Fact Sheet for Patients:  EntrepreneurPulse.com.au  Fact Sheet for Healthcare Providers:  IncredibleEmployment.be  This test is no t yet approved or cleared by the [place] FDA and  has been authorized for detection and/or diagnosis of SARS-CoV-2 by FDA under an Emergency Use Authorization (EUA). This EUA will remain  in effect (meaning this test can be used) for the duration of the COVID-19 declaration under Section 564(b)(1) of the Act, 21 U.S.C.section 360bbb-3(b)(1), unless the  authorization is terminated  or revoked sooner.       Influenza A by PCR NEGATIVE NEGATIVE Final   Influenza B by PCR NEGATIVE NEGATIVE Final    Comment: (NOTE) The Xpert Xpress SARS-CoV-2/FLU/RSV plus assay is intended as an aid in the diagnosis of influenza from Nasopharyngeal swab specimens and should not be used as a sole basis for treatment. Nasal washings and aspirates are unacceptable for Xpert Xpress SARS-CoV-2/FLU/RSV testing.  Fact Sheet for Patients: EntrepreneurPulse.com.au  Fact Sheet for Healthcare Providers: IncredibleEmployment.be  This test is not yet approved or cleared by the [place] FDA and has been authorized for detection and/or diagnosis of SARS-CoV-2 by FDA under an Emergency Use Authorization (EUA). This EUA will remain in effect (meaning this test can be used) for the duration of the COVID-19 declaration under Section 564(b)(1) of the Act, 21 U.S.C. section 360bbb-3(b)(1), unless the authorization is terminated or revoked.  Performed at [facility], [place] [address] [place], [place] [address]          Radiology Studies: No results found.      Scheduled Meds: .  HYDROmorphone (DILAUDID) injection  0.5 mg Intravenous Q8H   Continuous Infusions:  LOS: 6 days      Geradine Girt, DO [facility]  09/26/2020, 10:45 AM

## 2020-09-26 NOTE — Progress Notes (Signed)
[facility] [id number] [facility]) Hospitalized Hospice Patient   Amber Lara is a current hospice patient, admitted to hospice services on 09/16/20 with a terminal diagnosis of ESRD. Staff at [facility] activated EMS on 09/18/20 when patient verbalized desire to continue with dialysis. Patient was admitted to [facility] on 09/19/20 with a diagnosis of ESRD for comfort care. Per Dr. Eulas Post, [facility] MD, this is a related hospital admission.   Visited patient at bedside. Patient is not responsive to voice or touch. She does not appear in distress. Spoke with daughter by phone to update. There continues to be no open beds at [facility].  The family has refused our hospice facility in [place] due to transportation concerns. They family has also adamantly refused that she returns to [facility].   V/S: none taken at the request of the family, full comfort measures I&O, labs, diagnostics: none, full comfort measures IVs/PRNs: dilaudid 0.5 mg IV x 3  Problem List: - ESRD - last dialysis 15 days ago, full comfort - acute metabolic encephalopathy - baseline is not clear  GOC: clear, full comfort measures D/C planning: family would like [facility], however there is not an open bed for the foreseeable future. We have offered a bed at our facility in [place], family has declined due to lack of transportation.  IDT: hospice team updated Family: updated by phone  A Please do not hesitate to call with questions.     Thank you,    Farrel Gordon, RN, [place] (listed on [facility] under [place])     [phone]

## 2020-09-27 MED ORDER — CAMPHOR-MENTHOL 0.5-0.5 % EX LOTN: TOPICAL_LOTION | CUTANEOUS | 0 refills | Status: AC | PRN

## 2020-09-27 MED ORDER — HYDROMORPHONE HCL 1 MG/ML IJ SOLN
0.5000 mg | Freq: Three times a day (TID) | INTRAMUSCULAR | 0 refills | Status: AC
Start: 2020-09-27 — End: ?

## 2020-09-27 MED ORDER — HALOPERIDOL LACTATE 2 MG/ML PO CONC: 0.5000 mg | ORAL | 0 refills | Status: AC | PRN

## 2020-09-27 MED ORDER — HALOPERIDOL LACTATE 5 MG/ML IJ SOLN: 0.5000 mg | INTRAMUSCULAR | Status: AC | PRN

## 2020-09-27 MED ORDER — LORAZEPAM 2 MG/ML IJ SOLN: 0.5000 mg | INTRAMUSCULAR | 0 refills | Status: AC | PRN

## 2020-09-27 MED ORDER — HALOPERIDOL 0.5 MG PO TABS: 0.5000 mg | ORAL_TABLET | ORAL | Status: AC | PRN

## 2020-09-27 MED ORDER — HYDROMORPHONE HCL 1 MG/ML IJ SOLN: 0.5000 mg | INTRAMUSCULAR | 0 refills | Status: AC | PRN

## 2020-09-27 NOTE — Progress Notes (Signed)
Called report to nurse theresa at [facility]

## 2020-09-27 NOTE — Progress Notes (Signed)
Pt daughter at bedside, calm cooperative supportive. Discharge instructions reviewed w/daughter, daughter express and acknowledge understanding. Pt eyes open rr unlabored appears calm in no distress. Pt transported to [facility] via [facility]. LPIV saline lock intact

## 2020-09-27 NOTE — TOC Transition Note (Addendum)
Transition of Care [facility]) - CM/SW Discharge Note   Patient Details  Name: Amber Lara MRN: [id number] Date of Birth: 09-16-1961  [facility] CM/SW Contact:  Elliot Gurney [place], [place] Phone Number: [phone] 09/27/2020, 10:17 AM   Clinical Narrative:    Patient to be discharged to [facility] today by [facility]. Patient's nurse notified, report to be called in [phone]  12:42 [facility] called stating that they were asked by [facility] to transport patient today. Medical necessity form changed to [facility].Marland Kitchen  [place], LCSW Transition of Care [phone]    Final next level of care: [place]     Patient Goals and CMS Choice     Choice offered to / list presented to : Adult Children  Discharge Placement                       Discharge Plan and Services   Discharge Planning Services: CM Consult Post Acute Care Choice: Hospice                    HH Arranged: NA          Social Determinants of Health (SDOH) Interventions     Readmission Risk Interventions No flowsheet data found.

## 2020-09-27 NOTE — Progress Notes (Signed)
Manufacturing engineer [facility]) Hospital Liaison note.    Bed offered for today at [facility]. Spoke with family to confirm interest and explain services. Family agreeable to transfer today. TOC aware.  No paperwork is needed, as Ms Kain is currently a hospice patient with [facility].  Please use [facility] for tranport.  RN please call report to [phone]. Please be sure the [facility] DNR form transports with the patient.  Trevor Mace, patient's daughter, asks that all her belongings, especially her crocheted blanket, transport with her.  Family plans to met her at [facility].  Thank you for the opportunity to participate in this patient's care.  Chrislyn Edison Pace, BSN, RN [place] (listed on [place] under Hospice/[facility])    [phone]

## 2020-09-27 NOTE — Discharge Summary (Signed)
Physician Discharge Summary  Amber Lara HWY:[id number] DOB: [birth year] DOA: 09/18/2020  PCP: Patient, No Pcp Per  Admit date: 09/18/2020 Discharge date: 09/27/2020  Admitted From: SNF Discharge disposition: residential hospice     Discharge Diagnosis:   Principal Problem:   Comfort measures only status Active Problems:   ESRD (end stage renal disease) ([place])   Schizophrenia ([place])   Acute metabolic encephalopathy    Discharge Condition: Improved.  Diet recommendation: liquid comfort feeds  Wound care: None.  Code status: DNR   History of Present Illness:   Amber Lara is an 59 y.o. female with ESRD, longstanding psychiatric history who is brought in by her daughters for comfort care.  Patient lives in a nursing facility and had been on dialysis for several years however over the past couple of months has been resisting dialysis, has been pulling out her lines has been very agitated during dialysis.  Decision has been made to discontinue dialysis and place patient on hospice.  Patient has been accepted by hospice and is now under their care.  Patient's daughter is her POA.  Patient has not had dialysis in 8 days however apparently at the nursing facility doctor disagrees with the decision to discontinue HD and they sent her to the ED for emergent dialysis.  ED Course:  The patient's situation was discussed and hospice was called.  They were hoping to have a hospice bed at [facility] later today however it appears that there may not be a bed available for couple of days so they are requesting admission pending hospice bed availability.   Hospital Course by Problem:   ESRD on HD Psychiatric disorder Patient presenting from [facility] SNF by direction of nursing home provider for need of emergent dialysis. Patient has not had full dialysis session the past 30 days with last partial session 8 days prior. Patient's family requests transition to comfort  measures as they believe she will not be able to continue hemodialysis in the future given her progressive psychiatric history and noncompliance with HD sessions. Patient follows with hospice, [facility] outpatient. --On comfort measures, DNR --residential hospice    Medical Consultants:      Discharge Exam:   Vitals:   09/22/20 0012 09/25/20 1319  BP: (!) 186/76   Pulse: 77   Resp: 18   Temp:  99.9 F (37.7 C)  SpO2: 98%    Vitals:   09/21/20 0509 09/21/20 1210 09/22/20 0012 09/25/20 1319  BP: (!) 160/74 (!) 161/78 (!) 186/76   Pulse: 70 75 77   Resp: _0 Temp:  97.7 F (36.5 C) 98.4 F (36.9 C) 99.9 F (37.7 C)  TempSrc:  Oral Axillary Axillary  SpO2: 96% 97% 98%   Weight:      Height:        General exam: eyes open, not speaking   The results of significant diagnostics from this hospitalization (including imaging, microbiology, ancillary and laboratory) are listed below for reference.     Procedures and Diagnostic Studies:   No results found.   Labs:   Basic Metabolic Panel: No results for input(s): NA, K, CL, CO2, GLUCOSE, BUN, CREATININE, CALCIUM, MG, PHOS in the last 168 hours. GFR Estimated Creatinine Clearance: 7 mL/min (A) (by C-G formula based on SCr of 11.77 mg/dL (H)). Liver Function Tests: No results for input(s): AST, ALT, ALKPHOS, BILITOT, PROT, ALBUMIN in the last 168 hours. No results for input(s): LIPASE, AMYLASE in the last 168  hours. No results for input(s): AMMONIA in the last 168 hours. Coagulation profile No results for input(s): INR, PROTIME in the last 168 hours.  CBC: No results for input(s): WBC, NEUTROABS, HGB, HCT, MCV, PLT in the last 168 hours. Cardiac Enzymes: No results for input(s): CKTOTAL, CKMB, CKMBINDEX, TROPONINI in the last 168 hours. BNP: Invalid input(s): POCBNP CBG: No results for input(s): GLUCAP in the last 168 hours. D-Dimer No results for input(s): DDIMER in the last 72 hours. Hgb A1c No  results for input(s): HGBA1C in the last 72 hours. Lipid Profile No results for input(s): CHOL, HDL, LDLCALC, TRIG, CHOLHDL, LDLDIRECT in the last 72 hours. Thyroid function studies No results for input(s): TSH, T4TOTAL, T3FREE, THYROIDAB in the last 72 hours.  Invalid input(s): FREET3 Anemia work up No results for input(s): VITAMINB12, FOLATE, FERRITIN, TIBC, IRON, RETICCTPCT in the last 72 hours. Microbiology Recent Results (from the past 240 hour(s))  Resp Panel by RT-PCR (Flu A&B, Covid) Nasopharyngeal Swab     Status: None   Collection Time: 09/19/20  8:46 AM   Specimen: Nasopharyngeal Swab; Nasopharyngeal(NP) swabs in vial transport medium  Result Value Ref Range Status   SARS Coronavirus 2 by RT PCR NEGATIVE NEGATIVE Final    Comment: (NOTE) SARS-CoV-2 target nucleic acids are NOT DETECTED.  The SARS-CoV-2 RNA is generally detectable in upper respiratory specimens during the acute phase of infection. The lowest concentration of SARS-CoV-2 viral copies this assay can detect is 138 copies/mL. A negative result does not preclude SARS-Cov-2 infection and should not be used as the sole basis for treatment or other patient management decisions. A negative result may occur with  improper specimen collection/handling, submission of specimen other than nasopharyngeal swab, presence of viral mutation(s) within the areas targeted by this assay, and inadequate number of viral copies(<138 copies/mL). A negative result must be combined with clinical observations, patient history, and epidemiological information. The expected result is Negative.  Fact Sheet for Patients:  EntrepreneurPulse.com.au  Fact Sheet for Healthcare Providers:  IncredibleEmployment.be  This test is no t yet approved or cleared by the [place] FDA and  has been authorized for detection and/or diagnosis of SARS-CoV-2 by FDA under an Emergency Use Authorization (EUA). This  EUA will remain  in effect (meaning this test can be used) for the duration of the COVID-19 declaration under Section 564(b)(1) of the Act, 21 U.S.C.section 360bbb-3(b)(1), unless the authorization is terminated  or revoked sooner.       Influenza A by PCR NEGATIVE NEGATIVE Final   Influenza B by PCR NEGATIVE NEGATIVE Final    Comment: (NOTE) The Xpert Xpress SARS-CoV-2/FLU/RSV plus assay is intended as an aid in the diagnosis of influenza from Nasopharyngeal swab specimens and should not be used as a sole basis for treatment. Nasal washings and aspirates are unacceptable for Xpert Xpress SARS-CoV-2/FLU/RSV testing.  Fact Sheet for Patients: EntrepreneurPulse.com.au  Fact Sheet for Healthcare Providers: IncredibleEmployment.be  This test is not yet approved or cleared by the [place] FDA and has been authorized for detection and/or diagnosis of SARS-CoV-2 by FDA under an Emergency Use Authorization (EUA). This EUA will remain in effect (meaning this test can be used) for the duration of the COVID-19 declaration under Section 564(b)(1) of the Act, 21 U.S.C. section 360bbb-3(b)(1), unless the authorization is terminated or revoked.  Performed at [facility], [place] [address] [place], [place] [address]      Discharge Instructions:    Allergies as of 09/27/2020  Reactions   Penicillins Anaphylaxis, Hives   Has patient had a PCN reaction causing immediate rash, facial/tongue/throat swelling, SOB or lightheadedness with hypotension: yes Has patient had a PCN reaction causing severe rash involving mucus membranes or skin n24} If all of the above answers are "NO", then may proceed with Cephalosporin use.ecrosis: no Has patient had a PCN reaction that required hospitalization: no Has patient had a PCN reaction occurring within the last 10 years:no   Tetracyclines & Related Anaphylaxis, Hives   Morphine And Related Hives    Amoxicillin    Same as penicillin   Latex Itching, Rash   Tape Itching, Rash      Medication List    STOP taking these medications   apixaban 5 MG Tabs tablet Commonly known as: ELIQUIS   ARTIFICIAL TEAR OINTMENT OP   blood glucose meter kit and supplies Kit   insulin glargine 100 UNIT/ML injection Commonly known as: LANTUS   insulin lispro 100 UNIT/ML injection Commonly known as: HUMALOG   Insulin Pen Needle 30G X 8 MM Misc Commonly known as: NOVOFINE   Insulin Syringe-Needle U-100 31G X 5/16" 0.3 ML Misc Commonly known as: TRUEplus Insulin Syringe   levETIRAcetam 500 MG tablet Commonly known as: KEPPRA   linagliptin 5 MG Tabs tablet Commonly known as: TRADJENTA   LORazepam 1 MG tablet Commonly known as: ATIVAN Replaced by: LORazepam 2 MG/ML injection   melatonin 3 MG Tabs tablet   metoprolol tartrate 50 MG tablet Commonly known as: LOPRESSOR   neomycin-bacitracin-polymyxin ophthalmic ointment Commonly known as: NEOSPORIN   OLANZapine 10 MG tablet Commonly known as: ZYPREXA   OLANZapine zydis 10 MG disintegrating tablet Commonly known as: ZYPREXA   ondansetron 4 MG tablet Commonly known as: ZOFRAN   oxyCODONE 5 MG immediate release tablet Commonly known as: Oxy IR/ROXICODONE   polyethylene glycol 17 g packet Commonly known as: MiraLax   sennosides-docusate sodium 8.6-50 MG tablet Commonly known as: SENOKOT-S   sevelamer carbonate 800 MG tablet Commonly known as: RENVELA   traMADol 50 MG tablet Commonly known as: Ultram   traZODone 100 MG tablet Commonly known as: DESYREL     TAKE these medications   acetaminophen 325 MG tablet Commonly known as: TYLENOL Take 2 tablets (650 mg total) by mouth every 6 (six) hours as needed for mild pain or moderate pain (or Fever >/= 101).   camphor-menthol lotion Commonly known as: SARNA Apply topically as needed for itching.   haloperidol 0.5 MG tablet Commonly known as: HALDOL Take 1 tablet (0.5  mg total) by mouth every 4 (four) hours as needed for agitation (or delirium).   haloperidol 2 MG/ML solution Commonly known as: HALDOL Place 0.3 mLs (0.6 mg total) under the tongue every 4 (four) hours as needed for agitation (or delirium).   haloperidol lactate 5 MG/ML injection Commonly known as: HALDOL Inject 0.1 mLs (0.5 mg total) into the vein every 4 (four) hours as needed (or delirium).   HYDROmorphone 1 MG/ML injection Commonly known as: DILAUDID Inject 0.5 mLs (0.5 mg total) into the vein every 2 (two) hours as needed for severe pain or moderate pain (SOB, agitation or appearance of distress).   HYDROmorphone 1 MG/ML injection Commonly known as: DILAUDID Inject 0.5 mLs (0.5 mg total) into the vein every 8 (eight) hours.   LORazepam 2 MG/ML injection Commonly known as: ATIVAN Inject 0.25 mLs (0.5 mg total) into the vein every 4 (four) hours as needed (restlessness/agitation). Replaces: LORazepam 1 MG tablet  Time coordinating discharge: 35 min  Signed:  Geradine Girt DO  [facility] 09/27/2020, 9:48 AM

## 2020-10-10 DEATH — deceased
